# Patient Record
Sex: Female | Born: 1940 | Race: White | Hispanic: No | Smoking: Former smoker
Health system: Southern US, Community
[De-identification: ages and names within clinical notes are randomized; demographics above are authoritative.]

## PROBLEM LIST (undated history)

## (undated) DIAGNOSIS — K5520 Angiodysplasia of colon without hemorrhage: Secondary | ICD-10-CM

## (undated) DIAGNOSIS — D509 Iron deficiency anemia, unspecified: Secondary | ICD-10-CM

## (undated) DIAGNOSIS — R51 Headache: Secondary | ICD-10-CM

## (undated) DIAGNOSIS — K922 Gastrointestinal hemorrhage, unspecified: Secondary | ICD-10-CM

## (undated) DIAGNOSIS — I959 Hypotension, unspecified: Secondary | ICD-10-CM

## (undated) DIAGNOSIS — J8410 Pulmonary fibrosis, unspecified: Secondary | ICD-10-CM

## (undated) DIAGNOSIS — M810 Age-related osteoporosis without current pathological fracture: Secondary | ICD-10-CM

## (undated) DIAGNOSIS — G4730 Sleep apnea, unspecified: Secondary | ICD-10-CM

## (undated) DIAGNOSIS — Z8489 Family history of other specified conditions: Secondary | ICD-10-CM

## (undated) DIAGNOSIS — R202 Paresthesia of skin: Secondary | ICD-10-CM

## (undated) DIAGNOSIS — Z973 Presence of spectacles and contact lenses: Secondary | ICD-10-CM

## (undated) DIAGNOSIS — J45909 Unspecified asthma, uncomplicated: Secondary | ICD-10-CM

## (undated) DIAGNOSIS — R000 Tachycardia, unspecified: Secondary | ICD-10-CM

## (undated) DIAGNOSIS — K745 Biliary cirrhosis, unspecified: Secondary | ICD-10-CM

## (undated) DIAGNOSIS — Z923 Personal history of irradiation: Secondary | ICD-10-CM

## (undated) DIAGNOSIS — F419 Anxiety disorder, unspecified: Secondary | ICD-10-CM

## (undated) DIAGNOSIS — J189 Pneumonia, unspecified organism: Secondary | ICD-10-CM

## (undated) DIAGNOSIS — R519 Headache, unspecified: Secondary | ICD-10-CM

## (undated) DIAGNOSIS — K219 Gastro-esophageal reflux disease without esophagitis: Secondary | ICD-10-CM

## (undated) DIAGNOSIS — Z8669 Personal history of other diseases of the nervous system and sense organs: Secondary | ICD-10-CM

## (undated) DIAGNOSIS — K7460 Unspecified cirrhosis of liver: Secondary | ICD-10-CM

## (undated) DIAGNOSIS — E785 Hyperlipidemia, unspecified: Secondary | ICD-10-CM

## (undated) DIAGNOSIS — Z9981 Dependence on supplemental oxygen: Secondary | ICD-10-CM

## (undated) DIAGNOSIS — R001 Bradycardia, unspecified: Secondary | ICD-10-CM

## (undated) DIAGNOSIS — K8020 Calculus of gallbladder without cholecystitis without obstruction: Secondary | ICD-10-CM

## (undated) DIAGNOSIS — F329 Major depressive disorder, single episode, unspecified: Secondary | ICD-10-CM

## (undated) DIAGNOSIS — F32A Depression, unspecified: Secondary | ICD-10-CM

## (undated) DIAGNOSIS — I214 Non-ST elevation (NSTEMI) myocardial infarction: Secondary | ICD-10-CM

## (undated) DIAGNOSIS — K743 Primary biliary cirrhosis: Secondary | ICD-10-CM

## (undated) DIAGNOSIS — R0600 Dyspnea, unspecified: Secondary | ICD-10-CM

## (undated) DIAGNOSIS — D649 Anemia, unspecified: Secondary | ICD-10-CM

## (undated) DIAGNOSIS — Z972 Presence of dental prosthetic device (complete) (partial): Secondary | ICD-10-CM

## (undated) DIAGNOSIS — M797 Fibromyalgia: Secondary | ICD-10-CM

## (undated) HISTORY — PX: TONSILLECTOMY: SUR1361

## (undated) HISTORY — DX: Unspecified asthma, uncomplicated: J45.909

## (undated) HISTORY — PX: SHOULDER SURGERY: SHX246

## (undated) HISTORY — PX: WRIST FRACTURE SURGERY: SHX121

## (undated) HISTORY — DX: Gastrointestinal hemorrhage, unspecified: K92.2

## (undated) HISTORY — DX: Sleep apnea, unspecified: G47.30

## (undated) HISTORY — DX: Anxiety disorder, unspecified: F41.9

## (undated) HISTORY — DX: Personal history of irradiation: Z92.3

## (undated) HISTORY — DX: Biliary cirrhosis, unspecified: K74.5

## (undated) HISTORY — DX: Pulmonary fibrosis, unspecified: J84.10

## (undated) HISTORY — DX: Angiodysplasia of colon without hemorrhage: K55.20

## (undated) HISTORY — PX: LAMINECTOMY: SHX219

## (undated) HISTORY — DX: Fibromyalgia: M79.7

## (undated) HISTORY — DX: Iron deficiency anemia, unspecified: D50.9

## (undated) HISTORY — DX: Hyperlipidemia, unspecified: E78.5

## (undated) HISTORY — DX: Gastro-esophageal reflux disease without esophagitis: K21.9

## (undated) HISTORY — PX: TOE SURGERY: SHX1073

## (undated) HISTORY — DX: Non-ST elevation (NSTEMI) myocardial infarction: I21.4

## (undated) HISTORY — PX: CATARACT EXTRACTION, BILATERAL: SHX1313

## (undated) HISTORY — DX: Primary biliary cirrhosis: K74.3

## (undated) HISTORY — DX: Unspecified cirrhosis of liver: K74.60

## (undated) HISTORY — DX: Pneumonia, unspecified organism: J18.9

## (undated) HISTORY — DX: Calculus of gallbladder without cholecystitis without obstruction: K80.20

## (undated) HISTORY — DX: Anemia, unspecified: D64.9

## (undated) HISTORY — DX: Age-related osteoporosis without current pathological fracture: M81.0

## (undated) HISTORY — PX: CHOLECYSTECTOMY: SHX55

---
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
## 2002-08-17 DIAGNOSIS — I214 Non-ST elevation (NSTEMI) myocardial infarction: Secondary | ICD-10-CM | POA: Insufficient documentation

## 2002-08-17 HISTORY — DX: Non-ST elevation (NSTEMI) myocardial infarction: I21.4

## 2002-08-17 HISTORY — PX: CARDIAC CATHETERIZATION: SHX172

## 2008-11-08 HISTORY — PX: COLONOSCOPY: SHX174

## 2011-08-19 DIAGNOSIS — Z1231 Encounter for screening mammogram for malignant neoplasm of breast: Secondary | ICD-10-CM | POA: Diagnosis not present

## 2011-09-02 DIAGNOSIS — F339 Major depressive disorder, recurrent, unspecified: Secondary | ICD-10-CM | POA: Diagnosis not present

## 2011-09-10 DIAGNOSIS — N951 Menopausal and female climacteric states: Secondary | ICD-10-CM | POA: Diagnosis not present

## 2011-09-10 DIAGNOSIS — J069 Acute upper respiratory infection, unspecified: Secondary | ICD-10-CM | POA: Diagnosis not present

## 2011-09-10 DIAGNOSIS — E7800 Pure hypercholesterolemia, unspecified: Secondary | ICD-10-CM | POA: Diagnosis not present

## 2011-09-10 DIAGNOSIS — Z23 Encounter for immunization: Secondary | ICD-10-CM | POA: Diagnosis not present

## 2011-09-10 DIAGNOSIS — Z0000 Encounter for general adult medical examination without abnormal findings: Secondary | ICD-10-CM | POA: Diagnosis not present

## 2011-09-10 DIAGNOSIS — M949 Disorder of cartilage, unspecified: Secondary | ICD-10-CM | POA: Diagnosis not present

## 2011-09-14 DIAGNOSIS — M949 Disorder of cartilage, unspecified: Secondary | ICD-10-CM | POA: Diagnosis not present

## 2011-09-14 DIAGNOSIS — M899 Disorder of bone, unspecified: Secondary | ICD-10-CM | POA: Diagnosis not present

## 2011-09-17 DIAGNOSIS — E538 Deficiency of other specified B group vitamins: Secondary | ICD-10-CM | POA: Diagnosis not present

## 2011-10-19 DIAGNOSIS — E538 Deficiency of other specified B group vitamins: Secondary | ICD-10-CM | POA: Diagnosis not present

## 2011-10-29 DIAGNOSIS — Z23 Encounter for immunization: Secondary | ICD-10-CM | POA: Diagnosis not present

## 2011-11-25 DIAGNOSIS — F339 Major depressive disorder, recurrent, unspecified: Secondary | ICD-10-CM | POA: Diagnosis not present

## 2012-01-01 DIAGNOSIS — E538 Deficiency of other specified B group vitamins: Secondary | ICD-10-CM | POA: Diagnosis not present

## 2012-02-01 DIAGNOSIS — E538 Deficiency of other specified B group vitamins: Secondary | ICD-10-CM | POA: Diagnosis not present

## 2012-02-25 DIAGNOSIS — F339 Major depressive disorder, recurrent, unspecified: Secondary | ICD-10-CM | POA: Diagnosis not present

## 2012-03-02 DIAGNOSIS — E538 Deficiency of other specified B group vitamins: Secondary | ICD-10-CM | POA: Diagnosis not present

## 2012-03-07 DIAGNOSIS — R1031 Right lower quadrant pain: Secondary | ICD-10-CM | POA: Diagnosis not present

## 2012-03-16 DIAGNOSIS — R1031 Right lower quadrant pain: Secondary | ICD-10-CM | POA: Diagnosis not present

## 2012-03-21 DIAGNOSIS — R1031 Right lower quadrant pain: Secondary | ICD-10-CM | POA: Diagnosis not present

## 2012-03-21 DIAGNOSIS — D649 Anemia, unspecified: Secondary | ICD-10-CM | POA: Diagnosis not present

## 2012-03-21 DIAGNOSIS — K745 Biliary cirrhosis, unspecified: Secondary | ICD-10-CM | POA: Diagnosis not present

## 2012-03-21 DIAGNOSIS — E7800 Pure hypercholesterolemia, unspecified: Secondary | ICD-10-CM | POA: Diagnosis not present

## 2012-04-06 DIAGNOSIS — E538 Deficiency of other specified B group vitamins: Secondary | ICD-10-CM | POA: Diagnosis not present

## 2012-05-12 DIAGNOSIS — E538 Deficiency of other specified B group vitamins: Secondary | ICD-10-CM | POA: Diagnosis not present

## 2012-06-01 DIAGNOSIS — F339 Major depressive disorder, recurrent, unspecified: Secondary | ICD-10-CM | POA: Diagnosis not present

## 2012-06-13 DIAGNOSIS — S52209A Unspecified fracture of shaft of unspecified ulna, initial encounter for closed fracture: Secondary | ICD-10-CM | POA: Diagnosis not present

## 2012-06-13 DIAGNOSIS — S52309A Unspecified fracture of shaft of unspecified radius, initial encounter for closed fracture: Secondary | ICD-10-CM | POA: Diagnosis not present

## 2012-06-13 DIAGNOSIS — R072 Precordial pain: Secondary | ICD-10-CM | POA: Diagnosis not present

## 2012-06-13 DIAGNOSIS — E538 Deficiency of other specified B group vitamins: Secondary | ICD-10-CM | POA: Diagnosis not present

## 2012-06-13 DIAGNOSIS — R079 Chest pain, unspecified: Secondary | ICD-10-CM | POA: Diagnosis not present

## 2012-07-18 DIAGNOSIS — E538 Deficiency of other specified B group vitamins: Secondary | ICD-10-CM | POA: Diagnosis not present

## 2012-08-23 DIAGNOSIS — E538 Deficiency of other specified B group vitamins: Secondary | ICD-10-CM | POA: Diagnosis not present

## 2012-08-23 DIAGNOSIS — E7800 Pure hypercholesterolemia, unspecified: Secondary | ICD-10-CM | POA: Diagnosis not present

## 2012-08-30 DIAGNOSIS — F339 Major depressive disorder, recurrent, unspecified: Secondary | ICD-10-CM | POA: Diagnosis not present

## 2012-09-01 DIAGNOSIS — L578 Other skin changes due to chronic exposure to nonionizing radiation: Secondary | ICD-10-CM | POA: Diagnosis not present

## 2012-09-01 DIAGNOSIS — L821 Other seborrheic keratosis: Secondary | ICD-10-CM | POA: Diagnosis not present

## 2012-09-01 DIAGNOSIS — L819 Disorder of pigmentation, unspecified: Secondary | ICD-10-CM | POA: Diagnosis not present

## 2012-09-01 DIAGNOSIS — D485 Neoplasm of uncertain behavior of skin: Secondary | ICD-10-CM | POA: Diagnosis not present

## 2012-09-23 DIAGNOSIS — E538 Deficiency of other specified B group vitamins: Secondary | ICD-10-CM | POA: Diagnosis not present

## 2012-10-05 DIAGNOSIS — Z0000 Encounter for general adult medical examination without abnormal findings: Secondary | ICD-10-CM | POA: Diagnosis not present

## 2012-10-05 DIAGNOSIS — E7800 Pure hypercholesterolemia, unspecified: Secondary | ICD-10-CM | POA: Diagnosis not present

## 2012-10-05 DIAGNOSIS — E538 Deficiency of other specified B group vitamins: Secondary | ICD-10-CM | POA: Diagnosis not present

## 2012-10-05 DIAGNOSIS — N951 Menopausal and female climacteric states: Secondary | ICD-10-CM | POA: Diagnosis not present

## 2012-10-05 DIAGNOSIS — E559 Vitamin D deficiency, unspecified: Secondary | ICD-10-CM | POA: Diagnosis not present

## 2012-10-14 DIAGNOSIS — E538 Deficiency of other specified B group vitamins: Secondary | ICD-10-CM | POA: Diagnosis not present

## 2012-10-14 DIAGNOSIS — Z1231 Encounter for screening mammogram for malignant neoplasm of breast: Secondary | ICD-10-CM | POA: Diagnosis not present

## 2012-10-14 DIAGNOSIS — E7800 Pure hypercholesterolemia, unspecified: Secondary | ICD-10-CM | POA: Diagnosis not present

## 2012-10-14 DIAGNOSIS — E559 Vitamin D deficiency, unspecified: Secondary | ICD-10-CM | POA: Diagnosis not present

## 2012-10-27 DIAGNOSIS — E538 Deficiency of other specified B group vitamins: Secondary | ICD-10-CM | POA: Diagnosis not present

## 2012-11-14 DIAGNOSIS — H2510 Age-related nuclear cataract, unspecified eye: Secondary | ICD-10-CM | POA: Diagnosis not present

## 2012-11-14 DIAGNOSIS — H40059 Ocular hypertension, unspecified eye: Secondary | ICD-10-CM | POA: Diagnosis not present

## 2012-11-28 DIAGNOSIS — F339 Major depressive disorder, recurrent, unspecified: Secondary | ICD-10-CM | POA: Diagnosis not present

## 2012-11-28 DIAGNOSIS — E538 Deficiency of other specified B group vitamins: Secondary | ICD-10-CM | POA: Diagnosis not present

## 2012-12-28 DIAGNOSIS — E538 Deficiency of other specified B group vitamins: Secondary | ICD-10-CM | POA: Diagnosis not present

## 2013-01-20 DIAGNOSIS — H25049 Posterior subcapsular polar age-related cataract, unspecified eye: Secondary | ICD-10-CM | POA: Diagnosis not present

## 2013-01-20 DIAGNOSIS — H18419 Arcus senilis, unspecified eye: Secondary | ICD-10-CM | POA: Diagnosis not present

## 2013-01-20 DIAGNOSIS — H25019 Cortical age-related cataract, unspecified eye: Secondary | ICD-10-CM | POA: Diagnosis not present

## 2013-01-20 DIAGNOSIS — H2510 Age-related nuclear cataract, unspecified eye: Secondary | ICD-10-CM | POA: Diagnosis not present

## 2013-01-20 DIAGNOSIS — H02839 Dermatochalasis of unspecified eye, unspecified eyelid: Secondary | ICD-10-CM | POA: Diagnosis not present

## 2013-02-28 DIAGNOSIS — Z01810 Encounter for preprocedural cardiovascular examination: Secondary | ICD-10-CM | POA: Diagnosis not present

## 2013-03-01 DIAGNOSIS — E538 Deficiency of other specified B group vitamins: Secondary | ICD-10-CM | POA: Diagnosis not present

## 2013-03-01 DIAGNOSIS — F339 Major depressive disorder, recurrent, unspecified: Secondary | ICD-10-CM | POA: Diagnosis not present

## 2013-03-06 DIAGNOSIS — H2510 Age-related nuclear cataract, unspecified eye: Secondary | ICD-10-CM | POA: Diagnosis not present

## 2013-03-06 DIAGNOSIS — H269 Unspecified cataract: Secondary | ICD-10-CM | POA: Diagnosis not present

## 2013-03-07 DIAGNOSIS — H2510 Age-related nuclear cataract, unspecified eye: Secondary | ICD-10-CM | POA: Diagnosis not present

## 2013-03-20 DIAGNOSIS — H269 Unspecified cataract: Secondary | ICD-10-CM | POA: Diagnosis not present

## 2013-03-20 DIAGNOSIS — H2510 Age-related nuclear cataract, unspecified eye: Secondary | ICD-10-CM | POA: Diagnosis not present

## 2013-04-06 DIAGNOSIS — E538 Deficiency of other specified B group vitamins: Secondary | ICD-10-CM | POA: Diagnosis not present

## 2013-05-08 DIAGNOSIS — E538 Deficiency of other specified B group vitamins: Secondary | ICD-10-CM | POA: Diagnosis not present

## 2013-06-01 DIAGNOSIS — F339 Major depressive disorder, recurrent, unspecified: Secondary | ICD-10-CM | POA: Diagnosis not present

## 2013-06-07 DIAGNOSIS — Z23 Encounter for immunization: Secondary | ICD-10-CM | POA: Diagnosis not present

## 2013-06-07 DIAGNOSIS — N23 Unspecified renal colic: Secondary | ICD-10-CM | POA: Diagnosis not present

## 2013-06-07 DIAGNOSIS — R1011 Right upper quadrant pain: Secondary | ICD-10-CM | POA: Diagnosis not present

## 2013-06-07 DIAGNOSIS — E538 Deficiency of other specified B group vitamins: Secondary | ICD-10-CM | POA: Diagnosis not present

## 2013-06-16 DIAGNOSIS — N23 Unspecified renal colic: Secondary | ICD-10-CM | POA: Diagnosis not present

## 2013-06-16 DIAGNOSIS — K8020 Calculus of gallbladder without cholecystitis without obstruction: Secondary | ICD-10-CM | POA: Diagnosis not present

## 2013-06-16 DIAGNOSIS — N3289 Other specified disorders of bladder: Secondary | ICD-10-CM | POA: Diagnosis not present

## 2013-06-16 DIAGNOSIS — R1011 Right upper quadrant pain: Secondary | ICD-10-CM | POA: Diagnosis not present

## 2013-06-29 DIAGNOSIS — K8010 Calculus of gallbladder with chronic cholecystitis without obstruction: Secondary | ICD-10-CM | POA: Diagnosis not present

## 2013-06-29 DIAGNOSIS — R1011 Right upper quadrant pain: Secondary | ICD-10-CM | POA: Diagnosis not present

## 2013-06-29 DIAGNOSIS — K7460 Unspecified cirrhosis of liver: Secondary | ICD-10-CM | POA: Diagnosis not present

## 2013-06-29 DIAGNOSIS — R1013 Epigastric pain: Secondary | ICD-10-CM | POA: Diagnosis not present

## 2013-07-05 DIAGNOSIS — I428 Other cardiomyopathies: Secondary | ICD-10-CM | POA: Diagnosis not present

## 2013-07-05 DIAGNOSIS — E7800 Pure hypercholesterolemia, unspecified: Secondary | ICD-10-CM | POA: Diagnosis not present

## 2013-07-05 DIAGNOSIS — I2510 Atherosclerotic heart disease of native coronary artery without angina pectoris: Secondary | ICD-10-CM | POA: Diagnosis not present

## 2013-07-06 DIAGNOSIS — Z01810 Encounter for preprocedural cardiovascular examination: Secondary | ICD-10-CM | POA: Diagnosis not present

## 2013-07-06 DIAGNOSIS — I2510 Atherosclerotic heart disease of native coronary artery without angina pectoris: Secondary | ICD-10-CM | POA: Diagnosis not present

## 2013-07-07 DIAGNOSIS — I359 Nonrheumatic aortic valve disorder, unspecified: Secondary | ICD-10-CM | POA: Diagnosis not present

## 2013-07-07 DIAGNOSIS — I059 Rheumatic mitral valve disease, unspecified: Secondary | ICD-10-CM | POA: Diagnosis not present

## 2013-07-07 DIAGNOSIS — I079 Rheumatic tricuspid valve disease, unspecified: Secondary | ICD-10-CM | POA: Diagnosis not present

## 2013-07-07 DIAGNOSIS — I428 Other cardiomyopathies: Secondary | ICD-10-CM | POA: Diagnosis not present

## 2013-07-10 DIAGNOSIS — E538 Deficiency of other specified B group vitamins: Secondary | ICD-10-CM | POA: Diagnosis not present

## 2013-08-02 DIAGNOSIS — K8010 Calculus of gallbladder with chronic cholecystitis without obstruction: Secondary | ICD-10-CM | POA: Diagnosis not present

## 2013-08-02 DIAGNOSIS — D134 Benign neoplasm of liver: Secondary | ICD-10-CM | POA: Diagnosis not present

## 2013-08-02 DIAGNOSIS — K7200 Acute and subacute hepatic failure without coma: Secondary | ICD-10-CM | POA: Diagnosis not present

## 2013-08-02 DIAGNOSIS — K8020 Calculus of gallbladder without cholecystitis without obstruction: Secondary | ICD-10-CM | POA: Diagnosis not present

## 2013-08-02 DIAGNOSIS — I519 Heart disease, unspecified: Secondary | ICD-10-CM | POA: Diagnosis not present

## 2013-08-02 DIAGNOSIS — K769 Liver disease, unspecified: Secondary | ICD-10-CM | POA: Diagnosis not present

## 2013-08-02 DIAGNOSIS — K819 Cholecystitis, unspecified: Secondary | ICD-10-CM | POA: Diagnosis not present

## 2013-08-02 DIAGNOSIS — I252 Old myocardial infarction: Secondary | ICD-10-CM | POA: Diagnosis not present

## 2013-08-09 DIAGNOSIS — E538 Deficiency of other specified B group vitamins: Secondary | ICD-10-CM | POA: Diagnosis not present

## 2013-08-31 DIAGNOSIS — F339 Major depressive disorder, recurrent, unspecified: Secondary | ICD-10-CM | POA: Diagnosis not present

## 2013-09-04 DIAGNOSIS — J1189 Influenza due to unidentified influenza virus with other manifestations: Secondary | ICD-10-CM | POA: Diagnosis not present

## 2013-09-04 DIAGNOSIS — J189 Pneumonia, unspecified organism: Secondary | ICD-10-CM | POA: Diagnosis not present

## 2013-09-07 DIAGNOSIS — J069 Acute upper respiratory infection, unspecified: Secondary | ICD-10-CM | POA: Diagnosis not present

## 2013-09-07 DIAGNOSIS — J984 Other disorders of lung: Secondary | ICD-10-CM | POA: Diagnosis not present

## 2013-09-07 DIAGNOSIS — J189 Pneumonia, unspecified organism: Secondary | ICD-10-CM | POA: Diagnosis not present

## 2013-09-11 DIAGNOSIS — J189 Pneumonia, unspecified organism: Secondary | ICD-10-CM | POA: Diagnosis not present

## 2013-09-11 DIAGNOSIS — E538 Deficiency of other specified B group vitamins: Secondary | ICD-10-CM | POA: Diagnosis not present

## 2013-10-05 DIAGNOSIS — E538 Deficiency of other specified B group vitamins: Secondary | ICD-10-CM | POA: Diagnosis not present

## 2013-10-05 DIAGNOSIS — E7800 Pure hypercholesterolemia, unspecified: Secondary | ICD-10-CM | POA: Diagnosis not present

## 2013-10-05 DIAGNOSIS — Z79899 Other long term (current) drug therapy: Secondary | ICD-10-CM | POA: Diagnosis not present

## 2013-10-05 DIAGNOSIS — Z0000 Encounter for general adult medical examination without abnormal findings: Secondary | ICD-10-CM | POA: Diagnosis not present

## 2013-10-05 DIAGNOSIS — Z6830 Body mass index (BMI) 30.0-30.9, adult: Secondary | ICD-10-CM | POA: Diagnosis not present

## 2013-10-05 DIAGNOSIS — R1011 Right upper quadrant pain: Secondary | ICD-10-CM | POA: Diagnosis not present

## 2013-10-05 DIAGNOSIS — Z23 Encounter for immunization: Secondary | ICD-10-CM | POA: Diagnosis not present

## 2013-10-17 DIAGNOSIS — E7800 Pure hypercholesterolemia, unspecified: Secondary | ICD-10-CM | POA: Diagnosis not present

## 2013-10-17 DIAGNOSIS — E538 Deficiency of other specified B group vitamins: Secondary | ICD-10-CM | POA: Diagnosis not present

## 2013-10-17 DIAGNOSIS — Z1231 Encounter for screening mammogram for malignant neoplasm of breast: Secondary | ICD-10-CM | POA: Diagnosis not present

## 2013-10-27 DIAGNOSIS — E538 Deficiency of other specified B group vitamins: Secondary | ICD-10-CM | POA: Diagnosis not present

## 2013-11-23 DIAGNOSIS — J209 Acute bronchitis, unspecified: Secondary | ICD-10-CM | POA: Diagnosis not present

## 2013-11-24 DIAGNOSIS — R0989 Other specified symptoms and signs involving the circulatory and respiratory systems: Secondary | ICD-10-CM | POA: Diagnosis not present

## 2013-11-24 DIAGNOSIS — R0609 Other forms of dyspnea: Secondary | ICD-10-CM | POA: Diagnosis not present

## 2013-11-24 DIAGNOSIS — J984 Other disorders of lung: Secondary | ICD-10-CM | POA: Diagnosis not present

## 2013-11-24 DIAGNOSIS — J189 Pneumonia, unspecified organism: Secondary | ICD-10-CM | POA: Diagnosis not present

## 2013-11-29 DIAGNOSIS — F339 Major depressive disorder, recurrent, unspecified: Secondary | ICD-10-CM | POA: Diagnosis not present

## 2013-12-05 DIAGNOSIS — H40059 Ocular hypertension, unspecified eye: Secondary | ICD-10-CM | POA: Diagnosis not present

## 2013-12-13 DIAGNOSIS — J309 Allergic rhinitis, unspecified: Secondary | ICD-10-CM | POA: Diagnosis not present

## 2013-12-13 DIAGNOSIS — J189 Pneumonia, unspecified organism: Secondary | ICD-10-CM | POA: Diagnosis not present

## 2013-12-13 DIAGNOSIS — B351 Tinea unguium: Secondary | ICD-10-CM | POA: Diagnosis not present

## 2014-01-24 DIAGNOSIS — H1045 Other chronic allergic conjunctivitis: Secondary | ICD-10-CM | POA: Diagnosis not present

## 2014-02-28 DIAGNOSIS — F339 Major depressive disorder, recurrent, unspecified: Secondary | ICD-10-CM | POA: Diagnosis not present

## 2014-05-31 DIAGNOSIS — J189 Pneumonia, unspecified organism: Secondary | ICD-10-CM | POA: Diagnosis not present

## 2014-05-31 DIAGNOSIS — F3342 Major depressive disorder, recurrent, in full remission: Secondary | ICD-10-CM | POA: Diagnosis not present

## 2014-06-20 DIAGNOSIS — J189 Pneumonia, unspecified organism: Secondary | ICD-10-CM | POA: Diagnosis not present

## 2014-06-20 DIAGNOSIS — J984 Other disorders of lung: Secondary | ICD-10-CM | POA: Diagnosis not present

## 2014-07-10 DIAGNOSIS — R918 Other nonspecific abnormal finding of lung field: Secondary | ICD-10-CM | POA: Diagnosis not present

## 2014-07-10 DIAGNOSIS — R0989 Other specified symptoms and signs involving the circulatory and respiratory systems: Secondary | ICD-10-CM | POA: Diagnosis not present

## 2014-07-10 DIAGNOSIS — J189 Pneumonia, unspecified organism: Secondary | ICD-10-CM | POA: Diagnosis not present

## 2014-07-10 DIAGNOSIS — J188 Other pneumonia, unspecified organism: Secondary | ICD-10-CM | POA: Diagnosis not present

## 2014-07-10 DIAGNOSIS — R05 Cough: Secondary | ICD-10-CM | POA: Diagnosis not present

## 2014-07-11 DIAGNOSIS — J069 Acute upper respiratory infection, unspecified: Secondary | ICD-10-CM | POA: Diagnosis not present

## 2014-07-31 DIAGNOSIS — J309 Allergic rhinitis, unspecified: Secondary | ICD-10-CM | POA: Diagnosis not present

## 2014-07-31 DIAGNOSIS — J188 Other pneumonia, unspecified organism: Secondary | ICD-10-CM | POA: Diagnosis not present

## 2014-08-02 DIAGNOSIS — J188 Other pneumonia, unspecified organism: Secondary | ICD-10-CM | POA: Diagnosis not present

## 2014-08-02 DIAGNOSIS — J189 Pneumonia, unspecified organism: Secondary | ICD-10-CM | POA: Diagnosis not present

## 2014-08-02 DIAGNOSIS — R599 Enlarged lymph nodes, unspecified: Secondary | ICD-10-CM | POA: Diagnosis not present

## 2014-08-06 ENCOUNTER — Telehealth: Payer: Self-pay | Admitting: Internal Medicine

## 2014-08-06 ENCOUNTER — Other Ambulatory Visit (INDEPENDENT_AMBULATORY_CARE_PROVIDER_SITE_OTHER): Payer: Medicare Other

## 2014-08-06 ENCOUNTER — Ambulatory Visit (INDEPENDENT_AMBULATORY_CARE_PROVIDER_SITE_OTHER): Payer: Medicare Other | Admitting: Internal Medicine

## 2014-08-06 ENCOUNTER — Encounter: Payer: Self-pay | Admitting: Internal Medicine

## 2014-08-06 VITALS — BP 128/84 | HR 78 | Ht 63.0 in | Wt 173.4 lb

## 2014-08-06 DIAGNOSIS — R05 Cough: Secondary | ICD-10-CM | POA: Diagnosis not present

## 2014-08-06 DIAGNOSIS — Z23 Encounter for immunization: Secondary | ICD-10-CM | POA: Diagnosis not present

## 2014-08-06 DIAGNOSIS — R059 Cough, unspecified: Secondary | ICD-10-CM

## 2014-08-06 DIAGNOSIS — J189 Pneumonia, unspecified organism: Secondary | ICD-10-CM | POA: Diagnosis not present

## 2014-08-06 DIAGNOSIS — J8410 Pulmonary fibrosis, unspecified: Secondary | ICD-10-CM

## 2014-08-06 LAB — CBC WITH DIFFERENTIAL/PLATELET
BASOS ABS: 0 10*3/uL (ref 0.0–0.1)
Basophils Relative: 0.5 % (ref 0.0–3.0)
Eosinophils Absolute: 0.2 10*3/uL (ref 0.0–0.7)
Eosinophils Relative: 2.1 % (ref 0.0–5.0)
HEMATOCRIT: 37.7 % — AB (ref 39.0–52.0)
Hemoglobin: 12 g/dL — ABNORMAL LOW (ref 13.0–17.0)
LYMPHS ABS: 1.7 10*3/uL (ref 0.7–4.0)
Lymphocytes Relative: 18.3 % (ref 12.0–46.0)
MCHC: 31.9 g/dL (ref 30.0–36.0)
MCV: 73.2 fl — ABNORMAL LOW (ref 78.0–100.0)
MONO ABS: 0.7 10*3/uL (ref 0.1–1.0)
Monocytes Relative: 8 % (ref 3.0–12.0)
Neutro Abs: 6.5 10*3/uL (ref 1.4–7.7)
Neutrophils Relative %: 71.1 % (ref 43.0–77.0)
PLATELETS: 444 10*3/uL — AB (ref 150.0–400.0)
RBC: 5.15 Mil/uL (ref 4.22–5.81)
RDW: 17.9 % — ABNORMAL HIGH (ref 11.5–15.5)
WBC: 9.1 10*3/uL (ref 4.0–10.5)

## 2014-08-06 NOTE — Telephone Encounter (Signed)
Err

## 2014-08-06 NOTE — Telephone Encounter (Signed)
Called and spoke with pt and she stated that she was to call back and let MW know that she is taking levaquin 500 mg daily.  She is aware to call back after she is done if her sputum is not clear.  Pt voiced her understanding and nothing further is needed. Will forward to MW for FYI.

## 2014-08-06 NOTE — Progress Notes (Signed)
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    08/06/2014 1st [facility] office visit/ Bastien Strawser  Already on tapering pred/ levaquin but not sure when started, did not bring meds in with her Chief Complaint  Patient presents with  . Pulmonary Consult    Referred by Dr. Burnett Sheng for eval of recurrent PNA. Pt states that she had PNA 4 x in 2015. She c/o prod cough with large amounts of green sputum. She also c/o SOB with "walking around, not too far, it's not severe".   says the main problem that doesn't seem to be responding is poor sleep and fatigue.  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  Main resp concern is constant sense of pnds but says it doesn't keep he up or result in any am exac of coughing.  No obvious day to day or daytime variabilty or assoc   cp or chest tightness, subjective wheeze or overt  hb symptoms. No unusual exp hx or h/o childhood pna/ asthma or knowledge of premature birth.  Sleeping ok without nocturnal  or early am exacerbation  of respiratory  c/o's or need for noct saba. Also denies any obvious fluctuation of symptoms with weather or environmental changes or other aggravating or alleviating factors except as outlined above   Current Medications, Allergies, Complete Past Medical History, Past Surgical History, Family History, and Social History were reviewed in [employer] record.  ROS  The following are not active complaints  unless bolded sore throat, dysphagia, dental problems, itching, sneezing,  nasal congestion or excess/ purulent secretions, ear ache,   fever, chills, sweats, unintended wt loss, pleuritic or exertional cp, hemoptysis,  orthopnea pnd or leg swelling, presyncope, palpitations, heartburn, abdominal pain, anorexia, nausea, vomiting, diarrhea  or change in bowel or urinary habits, change in stools or urine, dysuria,hematuria,  rash, arthralgias, visual complaints, headache, numbness weakness or ataxia or problems with walking or coordination,  change in mood/affect or memory.       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 Nl external ear canals without cough reflex   NECK :  without JVD/Nodes/TM/ nl carotid upstrokes bilaterally   LUNGS: no acc muscle use,  Min bilateraly nsp crackles without cough on insp or exp maneuvers   CV:  RRR  no s3 or murmur or increase in P2, no edema   ABD:  soft and nontender with nl excursion in the supine position. No bruits or organomegaly, bowel sounds nl  MS:  warm without deformities, calf tenderness, cyanosis or clubbing  SKIN: warm and dry without lesions    NEURO:  alert, approp, no deficits    CT with contrast  08/01/14  Non specific adenopathy no nodes > 2 cm,  Thickening of espophagus, bilateral lower lobe honeycombing no change since 02/2012 , opacificatin of cyctic air spaces w/in L Lung base      Assessment:

## 2014-08-06 NOTE — Patient Instructions (Addendum)
Finish up all your antibiotics and call us with the name of it and all other medications that are not on your med sheet today (ask for [name] at [phone])  GERD (REFLUX)  is an extremely common cause of respiratory symptoms just like yours , many times with no obvious heartburn at all.    It can be treated with medication, but also with lifestyle changes including avoidance of late meals, excessive alcohol, smoking cessation, and avoid fatty foods, chocolate, peppermint, colas, red wine, and acidic juices such as orange juice.  NO MINT OR MENTHOL PRODUCTS SO NO COUGH DROPS  USE SUGARLESS CANDY INSTEAD (Jolley ranchers or Stover's or Life Savers) or even ice chips will also do - the key is to swallow to prevent all throat clearing. NO OIL BASED VITAMINS - use powdered substitutes.    Please see patient coordinator before you leave today  to schedule sinus CT  Please remember to go to the lab   department downstairs for your tests - we will call you with the results when they are available.  Call at the end of antibiotics if mucus is not clear - you need Prevnar13 if have not already received - one year after the other pneumonia shot   Please schedule a follow up office visit in 4 weeks, sooner if needed with all meds in hand

## 2014-08-07 DIAGNOSIS — J8410 Pulmonary fibrosis, unspecified: Secondary | ICD-10-CM | POA: Insufficient documentation

## 2014-08-07 DIAGNOSIS — J189 Pneumonia, unspecified organism: Secondary | ICD-10-CM | POA: Insufficient documentation

## 2014-08-07 LAB — ALLERGY FULL PROFILE
Alternaria Alternata: <0.1 kU/L
Aspergillus fumigatus, m3: <0.1 kU/L
Bermuda Grass: <0.1 kU/L
Candida Albicans: <0.1 kU/L
Cat Dander: <0.1 kU/L
Common Ragweed: <0.1 kU/L
Curvularia lunata: <0.1 kU/L
D. farinae: <0.1 kU/L
Dog Dander: <0.1 kU/L
Elm IgE: <0.1 kU/L
Fescue: <0.1 kU/L
G009 Red Top: <0.1 kU/L
Goldenrod: <0.1 kU/L
Helminthosporium halodes: <0.1 kU/L
House Dust Hollister: <0.1 kU/L
Lamb's Quarters: <0.1 kU/L
Plantain: <0.1 kU/L
Sycamore Tree: <0.1 kU/L

## 2014-08-07 NOTE — Assessment & Plan Note (Signed)
DDx for pulmonary fibrosis  includes idiopathic pulmonary fibrosis, pulmonary fibrosis associated with rheumatologic diseases (which have a relatively benign course in most cases) , adverse effect from  drugs such as chemotherapy or amiodarone exposure, nonspecific interstitial pneumonia which is typically steroid responsive, and chronic hypersensitivity pneumonitis.   In active  smokers Langerhan's Cell  Histiocyctosis (eosinophilic granuomatosis),  DIP,  and Respiratory Bronchiolitis ILD also need to be considered,   Use of PPI is associated with improved survival time and with decreased radiologic fibrosis per King's study published in AJRCCM vol 184 p1390.  Dec 2011  This may not be cause and effect, but given how universally unhelpful all the otherstudy drugs have been for pf,   rec max  ppi / diet/ lifestyle modification.    See instructions for specific recommendations which were reviewed directly with the patient who was given a copy with highlighter outlining the key components.

## 2014-08-07 NOTE — Assessment & Plan Note (Signed)
Already on levaquin which is approp and asked her to complete this then call me if mucus stays purulent to regroup before more empirical abx given risk of side effects and chance of developing resistant orgs/ c diff etc.

## 2014-08-07 NOTE — Assessment & Plan Note (Signed)
The most common causes of chronic cough in immunocompetent adults include the following: upper airway cough syndrome (UACS), previously referred to as postnasal drip syndrome (PNDS), which is caused by variety of rhinosinus conditions; (2) asthma; (3) GERD; (4) chronic bronchitis from cigarette smoking or other inhaled environmental irritants; (5) nonasthmatic eosinophilic bronchitis; and (6) bronchiectasis.   These conditions, singly or in combination, have accounted for up to 94% of the causes of chronic cough in prospective studies.   Other conditions have constituted no >6% of the causes in prospective studies These have included bronchogenic carcinoma, chronic interstitial pneumonia, sarcoidosis, left ventricular failure, ACEI-induced cough, and aspiration from a condition associated with pharyngeal dysfunction.    Chronic cough is often simultaneously caused by more than one condition. A single cause has been found from 38 to 82% of the time, multiple causes from 18 to 62%. Multiply caused cough has been the result of three diseases up to 42% of the time.       Most likely the chronic pnds assoc cough =  Classic Upper airway cough syndrome, so named because it's frequently impossible to sort out how much is  CR/sinusitis with freq throat clearing (which can be related to primary GERD)   vs  causing  secondary (" extra esophageal")  GERD from wide swings in gastric pressure that occur with throat clearing, often  promoting self use of mint and menthol lozenges that reduce the lower esophageal sphincter tone and exacerbate the problem further in a cyclical fashion.   These are the same pts (now being labeled as having "irritable larynx syndrome" by some cough centers) who not infrequently have a history of having failed to tolerate ace inhibitors,  dry powder inhalers or biphosphonates or report having atypical reflux symptoms that don't respond to standard doses of PPI , and are easily confused as  having aecopd or asthma flares by even experienced allergists/ pulmonologists.   For now eval with allergy profile and sinus CT and max rx for gerd then regroup in 4 weeks

## 2014-08-07 NOTE — Addendum Note (Signed)
Addended by: Inge Rise on: 08/07/2014 01:47 PM   Modules accepted: Medications

## 2014-08-13 ENCOUNTER — Telehealth: Payer: Self-pay | Admitting: Internal Medicine

## 2014-08-13 NOTE — Telephone Encounter (Signed)
White count was normal so I recommend she stay off the antibiotic and on the recs I gave her for at least a week then return to office to regroup with all meds in hand

## 2014-08-13 NOTE — Telephone Encounter (Signed)
Called and spoke with pt and she stated that she finished the levaquin and pred taper on Saturday.  She is still coughing up yellow sputum that is not as thick and does have some white sputum mixed with it.  She stated that she is still coughing but is not having any fever but still does not feel well.  She was to call back and let MW know how she was.  MW please advise. Thanks  Allergies  Allergen Reactions  . Paxil [Paroxetine Hcl] Itching  . Tamiflu [Oseltamivir Phosphate]     "I just get sick"    Current Outpatient Prescriptions on File Prior to Visit  Medication Sig Dispense Refill  . amitriptyline (ELAVIL) 10 MG tablet Take 20 mg by mouth at bedtime.    Marland Kitchen aspirin 81 MG tablet Take 81 mg by mouth daily.    . Cholecalciferol (VITAMIN D) 1000 UNITS capsule Take 1,000 Units by mouth daily.    Marland Kitchen ipratropium-albuterol (DUONEB) 0.5-2.5 (3) MG/3ML SOLN Take 3 mLs by nebulization every 6 (six) hours as needed.    . methylPREDNISolone (MEDROL DOSEPAK) 4 MG tablet follow package directions    . montelukast (SINGULAIR) 10 MG tablet Take 10 mg by mouth at bedtime.    . Multiple Vitamin (MULTIVITAMIN) capsule Take 1 capsule by mouth daily.    . sertraline (ZOLOFT) 100 MG tablet Take 150 mg by mouth daily.    Marland Kitchen zolpidem (AMBIEN) 5 MG tablet Take 5 mg by mouth at bedtime as needed for sleep.     No current facility-administered medications on file prior to visit.

## 2014-08-13 NOTE — Telephone Encounter (Signed)
Spoke with pt and notified of recs per Dr. Melvyn Novas. Pt verbalized understanding and denied any questions.

## 2014-08-15 ENCOUNTER — Ambulatory Visit (INDEPENDENT_AMBULATORY_CARE_PROVIDER_SITE_OTHER)
Admission: RE | Admit: 2014-08-15 | Discharge: 2014-08-15 | Disposition: A | Discharge status: Home / Self Care | Payer: Medicare Other | Source: Ambulatory Visit | Attending: Internal Medicine | Admitting: Internal Medicine

## 2014-08-15 DIAGNOSIS — R059 Cough, unspecified: Secondary | ICD-10-CM

## 2014-08-15 DIAGNOSIS — R05 Cough: Secondary | ICD-10-CM | POA: Diagnosis not present

## 2014-08-15 NOTE — Progress Notes (Signed)
Quick Note:  Spoke with pt and notified of results per Dr. Melvyn Novas. Pt verbalized understanding and denied any questions.m  ______

## 2014-08-27 ENCOUNTER — Telehealth: Payer: Self-pay | Admitting: Internal Medicine

## 2014-08-27 MED ORDER — AMOXICILLIN-POT CLAVULANATE 875-125 MG PO TABS: 1.0000 | ORAL_TABLET | Freq: Two times a day (BID) | ORAL | Status: DC

## 2014-08-27 NOTE — Telephone Encounter (Signed)
Ok to try Augmentin 875 mg take one pill twice daily  X 10 days - take at breakfast and supper with large glass of water.  It would help reduce the usual side effects (diarrhea and yeast infections) if you ate cultured yogurt at lunch.   Pending f/u ov - bring all meds

## 2014-08-27 NOTE — Telephone Encounter (Signed)
Spoke with pt - states that she is still having cough with thick yellow/green mucus and chills. Denies fever, SOB, chest tightness and wheezing. Upcoming appt 09/03/14 with MW.  Dr Melvyn Novas please advise. Thanks.

## 2014-08-27 NOTE — Telephone Encounter (Signed)
Called and spoke to pt. Informed pt of the recs per MW. Rx sent to preferred pharmacy. Pt verbalized understanding and denied any further questions or concerns at this time.   

## 2014-09-03 ENCOUNTER — Ambulatory Visit (INDEPENDENT_AMBULATORY_CARE_PROVIDER_SITE_OTHER): Payer: Medicare Other | Admitting: Internal Medicine

## 2014-09-03 ENCOUNTER — Encounter: Payer: Self-pay | Admitting: Internal Medicine

## 2014-09-03 VITALS — BP 126/74 | HR 77 | Temp 98.2°F | Ht 63.0 in | Wt 171.0 lb

## 2014-09-03 DIAGNOSIS — J8410 Pulmonary fibrosis, unspecified: Secondary | ICD-10-CM

## 2014-09-03 DIAGNOSIS — R05 Cough: Secondary | ICD-10-CM

## 2014-09-03 DIAGNOSIS — R059 Cough, unspecified: Secondary | ICD-10-CM

## 2014-09-03 MED ORDER — FAMOTIDINE 20 MG PO TABS: ORAL_TABLET | ORAL | Status: DC

## 2014-09-03 MED ORDER — PANTOPRAZOLE SODIUM 40 MG PO TBEC: 40.0000 mg | DELAYED_RELEASE_TABLET | Freq: Every day | ORAL | Status: DC

## 2014-09-03 NOTE — Patient Instructions (Signed)
Pantoprazole (protonix) 40 mg   Take 30-60 min before first meal of the day and Pepcid 20 mg one bedtime until return to office - this is the best way to tell whether stomach acid is contributing to your problem.   GERD (REFLUX)  is an extremely common cause of respiratory symptoms just like yours , many times with no obvious heartburn at all.    It can be treated with medication, but also with lifestyle changes including avoidance of late meals, excessive alcohol, smoking cessation, and avoid fatty foods, chocolate, peppermint, colas, red wine, and acidic juices such as orange juice.  NO MINT OR MENTHOL PRODUCTS SO NO COUGH DROPS  USE SUGARLESS CANDY INSTEAD (Jolley ranchers or Stover's or Life Savers) or even ice chips will also do - the key is to swallow to prevent all throat clearing. NO OIL BASED VITAMINS - use powdered substitutes.    Please schedule a follow up visit in 3 months but call sooner if needed for pfts and cxr

## 2014-09-03 NOTE — Progress Notes (Signed)
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   History of Present Illness  08/06/2014 1st [facility] office visit/ Lyle Niblett  Already on tapering pred/ levaquin but not sure when started, did not bring meds in with her Chief Complaint  Patient presents with  . Pulmonary Consult    Referred by Dr. Burnett Sheng for eval of recurrent PNA. Pt states that she had PNA 4 x in 2015. She c/o prod cough with large amounts of green sputum. She also c/o SOB with "walking around, not too far, it's not severe".   says the main problem that doesn't seem to be responding is poor sleep and fatigue.  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  Main resp concern is constant sense of pnds but says it doesn't keep he up or result in any am exac of coughing. rec Finish up all your antibiotics and call us with the name of it and all other medications that are not on your med sheet today (ask for [name] at [phone]) GERD diet   schedule sinus CT> neg Please remember to go to the lab department downstairs for your tests - we will call you with the results when they are available. Call at the end of antibiotics if mucus is not clear - you need Prevnar13 if have not already received - one year after the other pneumonia shot       09/03/2014 f/u ov/Malasia Torain re: honeycombing/ ? Asp  Chief Complaint  Patient presents with  . Follow-up    pt states she has had improvement with her breathing  since last visit.  c/o sob with exertion.    Not limited by breathing from desired activities      No obvious day to day or daytime variabilty or assoc cough /congesiton/   cp or chest tightness, subjective wheeze or overt  hb symptoms. No unusual exp hx or h/o childhood pna/ asthma or knowledge of premature birth.  Sleeping ok without nocturnal  or early am exacerbation  of respiratory  c/o's or need for noct saba. Also denies any obvious fluctuation of symptoms with weather or environmental changes or other aggravating or alleviating factors except as outlined above   Current Medications, Allergies, Complete Past Medical History, Past Surgical History, Family History, and Social History were reviewed in [employer] record.  ROS  The following are not active complaints unless bolded sore throat, dysphagia, dental problems, itching, sneezing,  nasal congestion or excess/ purulent secretions, ear ache,   fever, chills, sweats, unintended wt loss, pleuritic or exertional cp, hemoptysis,  orthopnea pnd or leg swelling, presyncope, palpitations, heartburn, abdominal pain, anorexia, nausea, vomiting, diarrhea  or change in bowel or urinary habits, change in stools or urine, dysuria,hematuria,  rash, arthralgias, visual complaints, headache, numbness weakness or ataxia or problems with walking or coordination,  change in mood/affect or memory.  Objective:   Physical Exam amb elederly wf nad      Wt Readings from Last 3 Encounters:  09/03/14 171 lb (77.565 kg)  08/06/14 173 lb 6.4 oz (78.654 kg)    Vital signs reviewed    HEENT: nl dentition, turbinates, and orophanx. Nl external ear canals without cough reflex   NECK :  without JVD/Nodes/TM/ nl carotid upstrokes bilaterally   LUNGS: no acc muscle use,  Min bilateraly insp crackles without cough on insp or exp maneuvers   CV:  RRR  no s3 or murmur or increase in P2, no edema   ABD:  soft and nontender  with nl excursion in the supine position. No bruits or organomegaly, bowel sounds nl  MS:  warm without deformities, calf tenderness, cyanosis or clubbing  SKIN: warm and dry without lesions    NEURO:  alert, approp, no deficits    CT with contrast  08/01/14  Non specific adenopathy no nodes > 2 cm,  Thickening of espophagus, bilateral lower lobe honeycombing no change since 02/2012 , opacificatin of cystic air spaces w/in L Lung base      Assessment:

## 2014-09-05 ENCOUNTER — Encounter: Payer: Self-pay | Admitting: Internal Medicine

## 2014-09-05 NOTE — Assessment & Plan Note (Signed)
-   first dx 02/2012 with honeycombing in bases by CT   Symptomatically approved with nothing more than rx for gerd may suggest cause and effect so very important that she return for "more points on the curve" to see if any rx needed.

## 2014-09-05 NOTE — Assessment & Plan Note (Addendum)
-   Allergy profile 08/06/2014 >  Eos 0    Ig E <2, neg RAST - Sinus Ct  08/15/2014 Well aerated paranasal sinuses without sinusitis.  Cough that does not occur on insp has improved and is probably not related to PF but more likely  Classic Upper airway cough syndrome, so named because it's frequently impossible to sort out how much is  CR/sinusitis with freq throat clearing (which can be related to primary GERD)   vs  causing  secondary (" extra esophageal")  GERD from wide swings in gastric pressure that occur with throat clearing, often  promoting self use of mint and menthol lozenges that reduce the lower esophageal sphincter tone and exacerbate the problem further in a cyclical fashion.   These are the same pts (now being labeled as having "irritable larynx syndrome" by some cough centers) who not infrequently have a history of having failed to tolerate ace inhibitors,  dry powder inhalers or biphosphonates or report having atypical reflux symptoms that don't respond to standard doses of PPI , and are easily confused as having aecopd or asthma flares by even experienced allergists/ pulmonologists.   For now will rec she continue singulair for rhinitis  and max rx for gerd  > f/u prn

## 2014-10-08 DIAGNOSIS — M8589 Other specified disorders of bone density and structure, multiple sites: Secondary | ICD-10-CM | POA: Diagnosis not present

## 2014-10-08 DIAGNOSIS — G2581 Restless legs syndrome: Secondary | ICD-10-CM | POA: Diagnosis not present

## 2014-10-08 DIAGNOSIS — K219 Gastro-esophageal reflux disease without esophagitis: Secondary | ICD-10-CM | POA: Diagnosis not present

## 2014-10-08 DIAGNOSIS — Z0000 Encounter for general adult medical examination without abnormal findings: Secondary | ICD-10-CM | POA: Diagnosis not present

## 2014-10-08 DIAGNOSIS — D649 Anemia, unspecified: Secondary | ICD-10-CM | POA: Diagnosis not present

## 2014-10-08 DIAGNOSIS — F320 Major depressive disorder, single episode, mild: Secondary | ICD-10-CM | POA: Diagnosis not present

## 2014-10-08 DIAGNOSIS — K743 Primary biliary cirrhosis: Secondary | ICD-10-CM | POA: Diagnosis not present

## 2014-10-08 DIAGNOSIS — J302 Other seasonal allergic rhinitis: Secondary | ICD-10-CM | POA: Diagnosis not present

## 2014-10-08 DIAGNOSIS — Z79899 Other long term (current) drug therapy: Secondary | ICD-10-CM | POA: Diagnosis not present

## 2014-10-08 DIAGNOSIS — E780 Pure hypercholesterolemia: Secondary | ICD-10-CM | POA: Diagnosis not present

## 2014-10-08 DIAGNOSIS — E559 Vitamin D deficiency, unspecified: Secondary | ICD-10-CM | POA: Diagnosis not present

## 2014-10-10 DIAGNOSIS — E538 Deficiency of other specified B group vitamins: Secondary | ICD-10-CM | POA: Diagnosis not present

## 2014-10-11 ENCOUNTER — Encounter: Payer: Self-pay | Admitting: Gastroenterology

## 2014-10-19 DIAGNOSIS — Z1231 Encounter for screening mammogram for malignant neoplasm of breast: Secondary | ICD-10-CM | POA: Diagnosis not present

## 2014-10-19 DIAGNOSIS — M810 Age-related osteoporosis without current pathological fracture: Secondary | ICD-10-CM | POA: Diagnosis not present

## 2014-10-19 DIAGNOSIS — Z13820 Encounter for screening for osteoporosis: Secondary | ICD-10-CM | POA: Diagnosis not present

## 2014-11-01 DIAGNOSIS — R609 Edema, unspecified: Secondary | ICD-10-CM | POA: Diagnosis not present

## 2014-11-01 DIAGNOSIS — F5109 Other insomnia not due to a substance or known physiological condition: Secondary | ICD-10-CM | POA: Diagnosis not present

## 2014-11-02 DIAGNOSIS — H40003 Preglaucoma, unspecified, bilateral: Secondary | ICD-10-CM | POA: Diagnosis not present

## 2014-11-08 DIAGNOSIS — Z23 Encounter for immunization: Secondary | ICD-10-CM | POA: Diagnosis not present

## 2014-11-08 DIAGNOSIS — E538 Deficiency of other specified B group vitamins: Secondary | ICD-10-CM | POA: Diagnosis not present

## 2014-11-28 ENCOUNTER — Encounter: Payer: Self-pay | Admitting: Gastroenterology

## 2014-11-28 ENCOUNTER — Ambulatory Visit (INDEPENDENT_AMBULATORY_CARE_PROVIDER_SITE_OTHER): Payer: Medicare Other | Admitting: Gastroenterology

## 2014-11-28 ENCOUNTER — Other Ambulatory Visit (INDEPENDENT_AMBULATORY_CARE_PROVIDER_SITE_OTHER): Payer: Medicare Other

## 2014-11-28 VITALS — BP 126/70 | HR 72 | Ht 62.75 in | Wt 178.5 lb

## 2014-11-28 DIAGNOSIS — R938 Abnormal findings on diagnostic imaging of other specified body structures: Secondary | ICD-10-CM

## 2014-11-28 DIAGNOSIS — R9389 Abnormal findings on diagnostic imaging of other specified body structures: Secondary | ICD-10-CM

## 2014-11-28 DIAGNOSIS — K743 Primary biliary cirrhosis: Secondary | ICD-10-CM | POA: Insufficient documentation

## 2014-11-28 DIAGNOSIS — R109 Unspecified abdominal pain: Secondary | ICD-10-CM

## 2014-11-28 DIAGNOSIS — D509 Iron deficiency anemia, unspecified: Secondary | ICD-10-CM

## 2014-11-28 DIAGNOSIS — J8410 Pulmonary fibrosis, unspecified: Secondary | ICD-10-CM

## 2014-11-28 HISTORY — DX: Iron deficiency anemia, unspecified: D50.9

## 2014-11-28 HISTORY — DX: Primary biliary cirrhosis: K74.3

## 2014-11-28 LAB — HEPATIC FUNCTION PANEL
ALT: 30 U/L (ref 0–35)
AST: 43 U/L — ABNORMAL HIGH (ref 0–37)
Albumin: 3.8 g/dL (ref 3.5–5.2)
Alkaline Phosphatase: 318 U/L — ABNORMAL HIGH (ref 39–117)
Bilirubin, Direct: 0.1 mg/dL (ref 0.0–0.3)
Total Bilirubin: 0.3 mg/dL (ref 0.2–1.2)
Total Protein: 8.4 g/dL — ABNORMAL HIGH (ref 6.0–8.3)

## 2014-11-28 NOTE — Assessment & Plan Note (Signed)
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  Recommendations #1.  Repeat LFTs

## 2014-11-28 NOTE — Assessment & Plan Note (Signed)
CT of the chest in December, 2015 demonstrated mediastinal and left hilar adenopathy along with diffuse thickening of the lower esophagus.  Recently esophagus probably reflect inflammatory changes though neoplasm cannot be ruled out.  Although I think is less likely, I recommend upper endoscopy in view of the CT findings including regional lymphadenopathy to be sure there is not a neoplasm.

## 2014-11-28 NOTE — Assessment & Plan Note (Signed)
Iron deficiency anemia of unclear etiology.  Prior GI workup negative.  In view of presumed stability of hemoglobin will not pursue further at this time.

## 2014-11-28 NOTE — Patient Instructions (Addendum)
You have been scheduled for an endoscopy. Please follow written instructions given to you at your visit today. If you use inhalers (even only as needed), please bring them with you on the day of your procedure. Your physician has requested that you go to www.[email or web address] and enter the access code given to you at your visit today. This web site gives a general overview about your procedure. However, you should still follow specific instructions given to you by our office regarding your preparation for the procedure.  Go to the basement for labs

## 2014-11-28 NOTE — Progress Notes (Signed)
_                                                                                                                History of Present Illness:  Ms. Weightman is a 74 year old white female referred at the request of Dr. Burnett Sheng she has a history of iron deficiency anemia and PBC.  She was seen at [facility] in the past for [place] and was placed on Urso.  She no longer is taking this.  For workup of chronic cough she underwent a CT scan in December, 2015.  By report, it demonstrated mediastinal and left hilar adenopathy and diffuse wall thickening involving the distal esophagus.  She had been complaining of postprandial lower chest and upper epigastric pain with radiation to the back.  Since starting Protonix and Pepcid both cough and pain have improved.  She still has pain but it is less severe and less frequent.   Past Medical History  Diagnosis Date  . Pneumonia   . Hyperlipemia   . Biliary cirrhosis   . Anemia   . AVM (arteriovenous malformation) of small bowel, acquired   . Anxiety   . Gallstones   . GERD (gastroesophageal reflux disease)   . GI bleeding    Past Surgical History  Procedure Laterality Date  . Tonsillectomy      as a child  . Shoulder surgery    . Cholecystectomy    . Laminectomy     family history includes Alzheimer's disease in her mother; Asthma in her father; Cervical cancer in her paternal grandmother; Colon polyps in her paternal aunt; Heart disease in her maternal uncle; Leukemia in her father; Stroke in her maternal aunt. There is no history of Colon cancer, Esophageal cancer, or Gallbladder disease. Current Outpatient Prescriptions  Medication Sig Dispense Refill  . amitriptyline (ELAVIL) 10 MG tablet Take 20 mg by mouth at bedtime.    Marland Kitchen aspirin 81 MG tablet Take 81 mg by mouth daily.    . Calcium-Magnesium-Vitamin D (CALCIUM 500 PO) Take 2 tablets by mouth daily.    . Cholecalciferol (VITAMIN D3) 2000 UNITS TABS Take 1 tablet by mouth daily.    .  famotidine (PEPCID) 20 MG tablet One at bedtime 30 tablet 11  . fluticasone (FLONASE) 50 MCG/ACT nasal spray Place 2 sprays into both nostrils daily.    Marland Kitchen ipratropium-albuterol (DUONEB) 0.5-2.5 (3) MG/3ML SOLN Take 3 mLs by nebulization every 6 (six) hours as needed.    . Iron Combinations (IRON COMPLEX PO) Take 65 mg by mouth daily. Pt takes 2 tablets, by mouth, every day    . montelukast (SINGULAIR) 10 MG tablet Take 10 mg by mouth at bedtime.    . Multiple Vitamin (MULTIVITAMIN) capsule Take 1 capsule by mouth daily.    . pantoprazole (PROTONIX) 40 MG tablet Take 1 tablet (40 mg total) by mouth daily. Take 30-60 min before first meal of the day 30 tablet 2  . sertraline (ZOLOFT) 100 MG tablet Take 150 mg by mouth  daily. Pt takes 2 tablets, by mouth, every day    . vitamin B-12 (CYANOCOBALAMIN) 50 MCG tablet Take 50 mcg by mouth daily. Pt takes 2 gummies a day    . zolpidem (AMBIEN) 5 MG tablet Take 5 mg by mouth at bedtime as needed for sleep.     No current facility-administered medications for this visit.   Allergies as of 11/28/2014 - Review Complete 11/28/2014  Allergen Reaction Noted  . Paxil [paroxetine hcl] Itching 08/06/2014  . Tamiflu [oseltamivir phosphate]  08/06/2014    reports that she quit smoking about 31 years ago. Her smoking use included Cigarettes. She has a 60 pack-year smoking history. She has never used smokeless tobacco. She reports that she does not drink alcohol or use illicit drugs.   Review of Systems: Pertinent positive and negative review of systems were noted in the above HPI section. All other review of systems were otherwise negative.  Vital signs were reviewed in today's medical record Physical Exam: General: Well developed , well nourished, no acute distress Skin: anicteric Head: Normocephalic and atraumatic Eyes:  sclerae anicteric, EOMI Ears: Normal auditory acuity Mouth: No deformity or lesions Neck: Supple, no masses or thyromegaly Lungs: Clear  throughout to auscultation Heart: Regular rate and rhythm; no murmurs, rubs or bruits Abdomen: Soft, non tender and non distended. No masses, hepatosplenomegaly or hernias noted. Normal Bowel sounds Rectal:deferred Musculoskeletal: Symmetrical with no gross deformities  Skin: No lesions on visible extremities Pulses:  Normal pulses noted Extremities: No clubbing, cyanosis, edema or deformities noted Neurological: Alert oriented x 4, grossly nonfocal Cervical Nodes:  No significant cervical adenopathy Inguinal Nodes: No significant inguinal adenopathy Psychological:  Alert and cooperative. Normal mood and affect  See Assessment and Plan under Problem List

## 2014-11-30 ENCOUNTER — Other Ambulatory Visit: Payer: Self-pay

## 2014-11-30 DIAGNOSIS — K743 Primary biliary cirrhosis: Secondary | ICD-10-CM

## 2014-11-30 MED ORDER — URSODIOL 300 MG PO CAPS: ORAL_CAPSULE | ORAL | Status: DC

## 2014-12-03 ENCOUNTER — Encounter: Payer: Self-pay | Admitting: Internal Medicine

## 2014-12-03 ENCOUNTER — Ambulatory Visit (INDEPENDENT_AMBULATORY_CARE_PROVIDER_SITE_OTHER)
Admission: RE | Admit: 2014-12-03 | Discharge: 2014-12-03 | Disposition: A | Discharge status: Home / Self Care | Payer: Medicare Other | Source: Ambulatory Visit | Attending: Internal Medicine | Admitting: Internal Medicine

## 2014-12-03 ENCOUNTER — Ambulatory Visit (INDEPENDENT_AMBULATORY_CARE_PROVIDER_SITE_OTHER): Payer: Medicare Other | Admitting: Internal Medicine

## 2014-12-03 VITALS — BP 122/80 | HR 74 | Ht 63.0 in | Wt 177.0 lb

## 2014-12-03 DIAGNOSIS — J8410 Pulmonary fibrosis, unspecified: Secondary | ICD-10-CM | POA: Diagnosis not present

## 2014-12-03 DIAGNOSIS — R059 Cough, unspecified: Secondary | ICD-10-CM

## 2014-12-03 DIAGNOSIS — J8489 Other specified interstitial pulmonary diseases: Secondary | ICD-10-CM | POA: Diagnosis not present

## 2014-12-03 DIAGNOSIS — R05 Cough: Secondary | ICD-10-CM | POA: Diagnosis not present

## 2014-12-03 NOTE — Progress Notes (Signed)
Quick Note:  Spoke with pt and notified of results per Dr. Wert. Pt verbalized understanding and denied any questions.  ______ 

## 2014-12-03 NOTE — Progress Notes (Signed)
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   History of Present Illness  08/06/2014 1st [facility] office visit/ [name]  Already on tapering pred/ levaquin but not sure when started, did not bring meds in with her Chief Complaint  Patient presents with  . Pulmonary Consult    Referred by Dr. Burnett Sheng for eval of recurrent PNA. Pt states that she had PNA 4 x in 2015. She c/o prod cough with large amounts of green sputum. She also c/o SOB with "walking around, not too far, it's not severe".   says the main problem that doesn't seem to be responding is poor sleep and fatigue.  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  Main resp concern is constant sense of pnds but says it doesn't keep he up or result in any am exac of coughing. rec Finish up all your antibiotics and call us with the name of it and all other medications that are not on your med sheet today (ask for [name] at [phone]) GERD diet   schedule sinus CT> neg Please remember to go to the lab department downstairs for your tests - we will call you with the results when they are available. Call at the end of antibiotics if mucus is not clear - you need Prevnar13 if have not already received - one year after the other pneumonia shot       09/03/2014 f/u ov/[name] re: honeycombing/ ? Asp  Chief Complaint  Patient presents with  . Follow-up    pt states she has had improvement with her breathing  since last visit.  c/o sob with exertion.    Not limited by breathing from desired activities    rec Pantoprazole (protonix) 40 mg   Take 30-60 min before first meal of the day and Pepcid 20 mg one bedtime until return to office - this is the best way to tell whether stomach acid is contributing to your problem.  GERD diet      12/03/2014 f/u ov/[name] re: honeycombing / ? Asp >  f/u [name] planned Chief Complaint  Patient presents with  . Follow-up    SOB w/activity; cough is better; feels better   No am or noct coughing / really Not limited by breathing from desired activities     No obvious day to day or daytime variabilty or assoc excess or purulent mucus or cp or chest tightness, subjective wheeze or overt  hb symptoms. No unusual exp hx or h/o childhood pna/ asthma or knowledge of premature birth.  Sleeping ok without nocturnal  or early am exacerbation  of respiratory  c/o's or need for noct saba. Also denies any obvious fluctuation of symptoms with weather or environmental changes or other aggravating or alleviating factors except as outlined above   Current Medications, Allergies, Complete Past Medical History, Past Surgical History, Family History, and Social History were reviewed  in [employer] record.  ROS  The following are not active complaints unless bolded sore throat, dysphagia, dental problems, itching, sneezing,  nasal congestion or excess/ purulent secretions, ear ache,   fever, chills, sweats, unintended wt loss, pleuritic or exertional cp, hemoptysis,  orthopnea pnd or leg swelling, presyncope, palpitations, heartburn, abdominal pain, anorexia, nausea, vomiting, diarrhea  or change in bowel or urinary habits, change in stools or urine, dysuria,hematuria,  rash, arthralgias, visual complaints, headache, numbness weakness or ataxia or problems with walking or coordination,  change in mood/affect or memory.            Objective:   Physical  Exam  amb elederly wf nad       . Wt Readings from Last 3 Encounters:  12/03/14 177 lb (80.287 kg)  11/28/14 178 lb 8 oz (80.967 kg)  09/03/14 171 lb (77.565 kg)    Vital signs reviewed    HEENT: nl dentition, turbinates, and orophanx. Nl external ear canals without cough reflex   NECK :  without JVD/Nodes/TM/ nl carotid upstrokes bilaterally   LUNGS: no acc muscle use,  Min bilateraly insp crackles without cough on insp or exp maneuvers   CV:  RRR  no s3 or murmur or increase in P2, no edema   ABD:  soft and nontender with nl excursion in the supine position. No bruits or organomegaly, bowel sounds nl  MS:  warm without deformities, calf tenderness, cyanosis or clubbing  SKIN: warm and dry without lesions    NEURO:  alert, approp, no deficits     CXR PA and Lateral:   12/03/2014 :     I personally reviewed images and agree with radiology impression as follows:    There is no acute cardiopulmonary abnormality. 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

## 2014-12-03 NOTE — Patient Instructions (Addendum)
Please remember to go to the x-ray department downstairs for your tests - we will call you with the results when they are available.  Will defer to Dr [name] re how to manage your GERD  Please schedule a follow up visit in 6  months but call sooner if needed   Late add: did not go for pfts 11/28/14 will ask her to do this at 13 m f/u

## 2014-12-04 ENCOUNTER — Encounter: Payer: Self-pay | Admitting: Internal Medicine

## 2014-12-04 NOTE — Assessment & Plan Note (Addendum)
-   first dx 02/2012 with honeycombing in bases by CT   I had an extended discussion with the patient reviewing all relevant studies completed to date and  lasting 15 to 20 minutes of a 25 minute visit on the following ongoing concerns:   No clinical or radiographic progression while on rx for GERD.  Use of PPI is associated with improved survival time and with decreased radiologic fibrosis per King's study published in AJRCCM vol 184 p1390.  Dec 2011  This may not be cause and effect, but given how universally unimpressive  all the other drugs have been for pf,   rec start continue  ppi / diet/ lifestyle modification.

## 2014-12-04 NOTE — Assessment & Plan Note (Signed)
-   Allergy profile 08/06/2014 >  Eos -0    Ig E <2, neg RAST - Sinus Ct  08/15/2014 Well aerated paranasal sinuses without sinusitis.  Still favor upper airway cough syndrome over PF as cause for cough/ better or max rx for gerd/ f/u per Dr [name] planned

## 2014-12-05 ENCOUNTER — Encounter: Payer: Self-pay | Admitting: Internal Medicine

## 2014-12-05 ENCOUNTER — Other Ambulatory Visit: Payer: Self-pay | Admitting: Internal Medicine

## 2014-12-05 DIAGNOSIS — J8410 Pulmonary fibrosis, unspecified: Secondary | ICD-10-CM

## 2014-12-06 ENCOUNTER — Encounter: Payer: Self-pay | Admitting: Gastroenterology

## 2014-12-06 ENCOUNTER — Ambulatory Visit (AMBULATORY_SURGERY_CENTER): Payer: Medicare Other | Admitting: Gastroenterology

## 2014-12-06 VITALS — BP 129/68 | HR 67 | Temp 98.2°F | Resp 15 | Ht 62.0 in | Wt 178.0 lb

## 2014-12-06 DIAGNOSIS — R109 Unspecified abdominal pain: Secondary | ICD-10-CM | POA: Diagnosis not present

## 2014-12-06 DIAGNOSIS — K2970 Gastritis, unspecified, without bleeding: Secondary | ICD-10-CM

## 2014-12-06 DIAGNOSIS — R1031 Right lower quadrant pain: Secondary | ICD-10-CM

## 2014-12-06 DIAGNOSIS — J45909 Unspecified asthma, uncomplicated: Secondary | ICD-10-CM | POA: Diagnosis not present

## 2014-12-06 DIAGNOSIS — K2940 Chronic atrophic gastritis without bleeding: Secondary | ICD-10-CM | POA: Diagnosis not present

## 2014-12-06 MED ORDER — SODIUM CHLORIDE 0.9 % IV SOLN: 500.0000 mL | INTRAVENOUS | Status: DC

## 2014-12-06 NOTE — Progress Notes (Signed)
A/ox3 pleased with MAC, report to Tracy RN 

## 2014-12-06 NOTE — Progress Notes (Signed)
Called to room to assist during endoscopic procedure.  Patient ID and intended procedure confirmed with present staff. Received instructions for my participation in the procedure from the performing physician.  

## 2014-12-06 NOTE — Op Note (Signed)
[facility]  [address]. [place], [address]   ENDOSCOPY PROCEDURE REPORT  PATIENT: Sherry, Wang  MR#: [id number] BIRTHDATE: 1940/12/06 , 77  yrs. old GENDER: female ENDOSCOPIST: Inda Castle, MD REFERRED BY:  Tanda Rockers, M.D. PROCEDURE DATE:  12/06/2014 PROCEDURE:  EGD w/ biopsy ASA CLASS:     Class II INDICATIONS:  cough and abnormal CT of the GI tract.showing a thickened distal esophagus. MEDICATIONS: Monitored anesthesia care and Propofol 140 mg IV TOPICAL ANESTHETIC:  DESCRIPTION OF PROCEDURE: After the risks benefits and alternatives of the procedure were thoroughly explained, informed consent was obtained.  The LB [id number] endoscope was introduced through the mouth and advanced to the second portion of the duodenum , Without limitations.  The instrument was slowly withdrawn as the mucosa was fully examined.    STOMACH: Gastritis (inflammation) was found in the gastric fundus and gastric body. The mucosa appeared featureless.  Distensibility was normal.  Biopsies were taken.  Remainder of the upper GI tract including esophagus, distal stomach and duodenum were normal. Except for the findings listed, the EGD was otherwise normal. Retroflexed views revealed no abnormalities.     The scope was then withdrawn from the patient and the procedure completed.  COMPLICATIONS: There were no immediate complications.  ENDOSCOPIC IMPRESSION: nonspecific gastritis  RECOMMENDATIONS: continue Protonix and Pepcid  REPEAT EXAM:  eSigned:  Inda Castle, MD 12/06/2014 3:37 PM    CC:

## 2014-12-06 NOTE — Patient Instructions (Signed)
Impressions/recommendations:  Nonspecific gastritis (handout given)  Continue Pepcid and Protonix  YOU HAD AN ENDOSCOPIC PROCEDURE TODAY AT [facility]:   Refer to the procedure report that was given to you for any specific questions about what was found during the examination.  If the procedure report does not answer your questions, please call your gastroenterologist to clarify.  If you requested that your care partner not be given the details of your procedure findings, then the procedure report has been included in a sealed envelope for you to review at your convenience later.  YOU SHOULD EXPECT: Some feelings of bloating in the abdomen. Passage of more gas than usual.  Walking can help get rid of the air that was put into your GI tract during the procedure and reduce the bloating. If you had a lower endoscopy (such as a colonoscopy or flexible sigmoidoscopy) you may notice spotting of blood in your stool or on the toilet paper. If you underwent a bowel prep for your procedure, you may not have a normal bowel movement for a few days.  Please Note:  You might notice some irritation and congestion in your nose or some drainage.  This is from the oxygen used during your procedure.  There is no need for concern and it should clear up in a day or so.  SYMPTOMS TO REPORT IMMEDIATELY:   Following upper endoscopy (EGD)  Vomiting of blood or coffee ground material  New chest pain or pain under the shoulder blades  Painful or persistently difficult swallowing  New shortness of breath  Fever of 100F or higher  Black, tarry-looking stools  For urgent or emergent issues, a gastroenterologist can be reached at any hour by calling [phone].   DIET: Your first meal following the procedure should be a small meal and then it is ok to progress to your normal diet. Heavy or fried foods are harder to digest and may make you feel nauseous or bloated.  Likewise, meals heavy in dairy  and vegetables can increase bloating.  Drink plenty of fluids but you should avoid alcoholic beverages for 24 hours.  ACTIVITY:  You should plan to take it easy for the rest of today and you should NOT DRIVE or use heavy machinery until tomorrow (because of the sedation medicines used during the test).    FOLLOW UP: Our staff will call the number listed on your records the next business day following your procedure to check on you and address any questions or concerns that you may have regarding the information given to you following your procedure. If we do not reach you, we will leave a message.  However, if you are feeling well and you are not experiencing any problems, there is no need to return our call.  We will assume that you have returned to your regular daily activities without incident.  If any biopsies were taken you will be contacted by phone or by letter within the next 1-3 weeks.  Please call us at [phone] if you have not heard about the biopsies in 3 weeks.    SIGNATURES/CONFIDENTIALITY: You and/or your care partner have signed paperwork which will be entered into your electronic medical record.  These signatures attest to the fact that that the information above on your After Visit Summary has been reviewed and is understood.  Full responsibility of the confidentiality of this discharge information lies with you and/or your care-partner.

## 2014-12-07 ENCOUNTER — Ambulatory Visit (HOSPITAL_COMMUNITY)
Admission: RE | Admit: 2014-12-07 | Discharge: 2014-12-07 | Disposition: A | Discharge status: Home / Self Care | Payer: Medicare Other | Source: Ambulatory Visit | Attending: Gastroenterology | Admitting: Gastroenterology

## 2014-12-07 ENCOUNTER — Telehealth: Payer: Self-pay

## 2014-12-07 DIAGNOSIS — K743 Primary biliary cirrhosis: Secondary | ICD-10-CM | POA: Diagnosis not present

## 2014-12-07 DIAGNOSIS — D649 Anemia, unspecified: Secondary | ICD-10-CM | POA: Insufficient documentation

## 2014-12-07 DIAGNOSIS — Z9049 Acquired absence of other specified parts of digestive tract: Secondary | ICD-10-CM | POA: Diagnosis not present

## 2014-12-07 DIAGNOSIS — K745 Biliary cirrhosis, unspecified: Secondary | ICD-10-CM | POA: Diagnosis not present

## 2014-12-07 NOTE — Telephone Encounter (Signed)
Unable to reach patient for follow up call.

## 2014-12-10 DIAGNOSIS — H40013 Open angle with borderline findings, low risk, bilateral: Secondary | ICD-10-CM | POA: Diagnosis not present

## 2014-12-10 DIAGNOSIS — H3531 Nonexudative age-related macular degeneration: Secondary | ICD-10-CM | POA: Diagnosis not present

## 2014-12-11 ENCOUNTER — Encounter: Payer: Self-pay | Admitting: Gastroenterology

## 2015-01-03 DIAGNOSIS — M8589 Other specified disorders of bone density and structure, multiple sites: Secondary | ICD-10-CM | POA: Diagnosis not present

## 2015-01-03 DIAGNOSIS — F320 Major depressive disorder, single episode, mild: Secondary | ICD-10-CM | POA: Diagnosis not present

## 2015-01-03 DIAGNOSIS — E780 Pure hypercholesterolemia: Secondary | ICD-10-CM | POA: Diagnosis not present

## 2015-01-03 DIAGNOSIS — F5109 Other insomnia not due to a substance or known physiological condition: Secondary | ICD-10-CM | POA: Diagnosis not present

## 2015-01-03 DIAGNOSIS — D649 Anemia, unspecified: Secondary | ICD-10-CM | POA: Diagnosis not present

## 2015-01-03 DIAGNOSIS — K743 Primary biliary cirrhosis: Secondary | ICD-10-CM | POA: Diagnosis not present

## 2015-01-03 DIAGNOSIS — J309 Allergic rhinitis, unspecified: Secondary | ICD-10-CM | POA: Diagnosis not present

## 2015-01-19 ENCOUNTER — Other Ambulatory Visit: Payer: Self-pay | Admitting: Internal Medicine

## 2015-02-01 ENCOUNTER — Telehealth: Payer: Self-pay | Admitting: Gastroenterology

## 2015-02-04 DIAGNOSIS — E538 Deficiency of other specified B group vitamins: Secondary | ICD-10-CM | POA: Diagnosis not present

## 2015-02-05 NOTE — Telephone Encounter (Signed)
Patient requests her pathology results be faxed to her PCP Dr Greig Right at [phone]. The office number is [phone].

## 2015-02-06 DIAGNOSIS — H43811 Vitreous degeneration, right eye: Secondary | ICD-10-CM | POA: Diagnosis not present

## 2015-03-06 DIAGNOSIS — E538 Deficiency of other specified B group vitamins: Secondary | ICD-10-CM | POA: Diagnosis not present

## 2015-04-08 DIAGNOSIS — J069 Acute upper respiratory infection, unspecified: Secondary | ICD-10-CM | POA: Diagnosis not present

## 2015-04-08 DIAGNOSIS — E538 Deficiency of other specified B group vitamins: Secondary | ICD-10-CM | POA: Diagnosis not present

## 2015-04-11 DIAGNOSIS — M8589 Other specified disorders of bone density and structure, multiple sites: Secondary | ICD-10-CM | POA: Diagnosis not present

## 2015-04-11 DIAGNOSIS — K743 Primary biliary cirrhosis: Secondary | ICD-10-CM | POA: Diagnosis not present

## 2015-04-11 DIAGNOSIS — E780 Pure hypercholesterolemia: Secondary | ICD-10-CM | POA: Diagnosis not present

## 2015-04-11 DIAGNOSIS — J84113 Idiopathic non-specific interstitial pneumonitis: Secondary | ICD-10-CM | POA: Diagnosis not present

## 2015-04-12 DIAGNOSIS — J8410 Pulmonary fibrosis, unspecified: Secondary | ICD-10-CM | POA: Diagnosis not present

## 2015-04-12 DIAGNOSIS — J84113 Idiopathic non-specific interstitial pneumonitis: Secondary | ICD-10-CM | POA: Diagnosis not present

## 2015-04-12 DIAGNOSIS — R05 Cough: Secondary | ICD-10-CM | POA: Diagnosis not present

## 2015-04-12 DIAGNOSIS — J8489 Other specified interstitial pulmonary diseases: Secondary | ICD-10-CM | POA: Diagnosis not present

## 2015-04-18 DIAGNOSIS — J069 Acute upper respiratory infection, unspecified: Secondary | ICD-10-CM | POA: Diagnosis not present

## 2015-04-18 DIAGNOSIS — K219 Gastro-esophageal reflux disease without esophagitis: Secondary | ICD-10-CM | POA: Diagnosis not present

## 2015-04-18 DIAGNOSIS — J84113 Idiopathic non-specific interstitial pneumonitis: Secondary | ICD-10-CM | POA: Diagnosis not present

## 2015-06-10 ENCOUNTER — Ambulatory Visit: Payer: Medicare Other | Admitting: Gastroenterology

## 2015-06-24 DIAGNOSIS — J411 Mucopurulent chronic bronchitis: Secondary | ICD-10-CM | POA: Diagnosis not present

## 2015-06-27 ENCOUNTER — Ambulatory Visit: Payer: Medicare Other | Admitting: Internal Medicine

## 2015-06-28 ENCOUNTER — Ambulatory Visit: Payer: Medicare Other | Admitting: Internal Medicine

## 2015-07-02 DIAGNOSIS — E538 Deficiency of other specified B group vitamins: Secondary | ICD-10-CM | POA: Diagnosis not present

## 2015-07-15 DIAGNOSIS — K219 Gastro-esophageal reflux disease without esophagitis: Secondary | ICD-10-CM | POA: Diagnosis not present

## 2015-07-15 DIAGNOSIS — J411 Mucopurulent chronic bronchitis: Secondary | ICD-10-CM | POA: Diagnosis not present

## 2015-07-15 DIAGNOSIS — Z79899 Other long term (current) drug therapy: Secondary | ICD-10-CM | POA: Diagnosis not present

## 2015-07-15 DIAGNOSIS — J84113 Idiopathic non-specific interstitial pneumonitis: Secondary | ICD-10-CM | POA: Diagnosis not present

## 2015-07-15 DIAGNOSIS — E7800 Pure hypercholesterolemia, unspecified: Secondary | ICD-10-CM | POA: Diagnosis not present

## 2015-07-15 DIAGNOSIS — D649 Anemia, unspecified: Secondary | ICD-10-CM | POA: Diagnosis not present

## 2015-07-25 ENCOUNTER — Ambulatory Visit (INDEPENDENT_AMBULATORY_CARE_PROVIDER_SITE_OTHER): Payer: Medicare Other | Admitting: Gastroenterology

## 2015-07-25 ENCOUNTER — Other Ambulatory Visit (INDEPENDENT_AMBULATORY_CARE_PROVIDER_SITE_OTHER): Payer: Medicare Other

## 2015-07-25 ENCOUNTER — Encounter: Payer: Self-pay | Admitting: Gastroenterology

## 2015-07-25 VITALS — BP 126/74 | HR 72 | Ht 62.75 in | Wt 180.5 lb

## 2015-07-25 DIAGNOSIS — K7469 Other cirrhosis of liver: Secondary | ICD-10-CM

## 2015-07-25 DIAGNOSIS — D649 Anemia, unspecified: Secondary | ICD-10-CM | POA: Diagnosis not present

## 2015-07-25 DIAGNOSIS — K7460 Unspecified cirrhosis of liver: Secondary | ICD-10-CM | POA: Diagnosis not present

## 2015-07-25 LAB — BASIC METABOLIC PANEL
BUN: 16 mg/dL (ref 6–23)
CHLORIDE: 103 meq/L (ref 96–112)
CO2: 29 meq/L (ref 19–32)
Calcium: 9.6 mg/dL (ref 8.4–10.5)
Creatinine, Ser: 1 mg/dL (ref 0.40–1.20)
GFR: 57.7 mL/min — AB (ref >60.00)
GLUCOSE: 94 mg/dL (ref 70–99)
POTASSIUM: 4 meq/L (ref 3.5–5.1)
SODIUM: 139 meq/L (ref 135–145)

## 2015-07-25 LAB — CBC WITH DIFFERENTIAL/PLATELET
BASOS PCT: 0.7 % (ref 0.0–3.0)
Basophils Absolute: 0 10*3/uL (ref 0.0–0.1)
EOS PCT: 3.7 % (ref 0.0–5.0)
Eosinophils Absolute: 0.2 10*3/uL (ref 0.0–0.7)
HCT: 41.2 % (ref 36.0–46.0)
Hemoglobin: 13.7 g/dL (ref 12.0–15.0)
LYMPHS ABS: 1.2 10*3/uL (ref 0.7–4.0)
Lymphocytes Relative: 18.8 % (ref 12.0–46.0)
MCHC: 33.2 g/dL (ref 30.0–36.0)
MCV: 83.2 fl (ref 78.0–100.0)
MONO ABS: 0.4 10*3/uL (ref 0.1–1.0)
MONOS PCT: 6.5 % (ref 3.0–12.0)
NEUTROS ABS: 4.5 10*3/uL (ref 1.4–7.7)
NEUTROS PCT: 70.3 % (ref 43.0–77.0)
Platelets: 239 10*3/uL (ref 150.0–400.0)
RBC: 4.95 Mil/uL (ref 3.87–5.11)
RDW: 14.7 % (ref 11.5–15.5)
WBC: 6.4 10*3/uL (ref 4.0–10.5)

## 2015-07-25 LAB — FERRITIN: FERRITIN: 18 ng/mL (ref 10.0–291.0)

## 2015-07-25 LAB — VITAMIN B12: Vitamin B-12: 498 pg/mL (ref 211–911)

## 2015-07-25 LAB — FOLATE: Folate: >23.8 ng/mL (ref >5.9)

## 2015-07-25 NOTE — Progress Notes (Signed)
Sherry Wang    [id number]    12-12-1940  Primary Care Physician:PENNER,PAMELA, MD  Referring Physician: Greig Right, MD [address] [place], [place] [address]  Chief complaint:   cirrhosis HPI:  74 year old female with history of PBC here for follow-up visit.  Patient reports noticing some weight gain and increased abdominal girth in the past few months.  No dyspnea or orthopnea.  Denies any nausea, vomiting, abdominal pain, melena or bright red blood per rectum  Otherwise overall she feels well and has no specific complaints   Outpatient Encounter Prescriptions as of 07/25/2015  Medication Sig  . amitriptyline (ELAVIL) 10 MG tablet Take 20 mg by mouth at bedtime.  Marland Kitchen aspirin 81 MG tablet Take 81 mg by mouth daily.  . Calcium-Magnesium-Vitamin D (CALCIUM 500 PO) Take 2 tablets by mouth daily.  . Cholecalciferol (VITAMIN D3) 2000 UNITS TABS Take 1 tablet by mouth daily.  Marland Kitchen DEXILANT 60 MG capsule Take 1 tablet by mouth daily.  . famotidine (PEPCID) 20 MG tablet One at bedtime  . fluticasone (FLONASE) 50 MCG/ACT nasal spray Place 2 sprays into both nostrils daily.  Marland Kitchen ipratropium-albuterol (DUONEB) 0.5-2.5 (3) MG/3ML SOLN Take 3 mLs by nebulization every 6 (six) hours as needed.  . Iron Combinations (IRON COMPLEX PO) Take 65 mg by mouth daily. Pt takes 2 tablets, by mouth, every day  . montelukast (SINGULAIR) 10 MG tablet Take 10 mg by mouth at bedtime.  . Multiple Vitamin (MULTIVITAMIN) capsule Take 1 capsule by mouth daily.  . sertraline (ZOLOFT) 100 MG tablet Take 150 mg by mouth daily. Pt takes 2 tablets, by mouth, every day  . vitamin B-12 (CYANOCOBALAMIN) 50 MCG tablet Take 50 mcg by mouth daily. Pt takes 2 gummies a day  . zolpidem (AMBIEN) 5 MG tablet Take 5 mg by mouth at bedtime as needed for sleep.  . pantoprazole (PROTONIX) 40 MG tablet TAKE ONE TABLET BY MOUTH ONCE DAILY. TAKE  30-60  MINUTES  BEFORE  FIRST  MEAL  OF  THE  DAY (Patient not taking:  Reported on 07/25/2015)  . [DISCONTINUED] ursodiol (ACTIGALL) 300 MG capsule Take 2 capsules twice daily   No facility-administered encounter medications on file as of 07/25/2015.    Allergies as of 07/25/2015 - Review Complete 07/25/2015  Allergen Reaction Noted  . Paxil [paroxetine hcl] Itching 08/06/2014  . Tamiflu [oseltamivir phosphate]  08/06/2014    Past Medical History  Diagnosis Date  . Pneumonia   . Hyperlipemia   . Biliary cirrhosis ([employer])   . Anemia   . AVM (arteriovenous malformation) of small bowel, acquired ([place])   . Anxiety   . Gallstones   . GERD (gastroesophageal reflux disease)   . GI bleeding   . Anemia, iron deficiency 11/28/2014  . 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 Tonsillectomy      as a child  . Shoulder surgery    . Cholecystectomy    . Laminectomy      Family History  Problem Relation Age of Onset  . Asthma Father   . Leukemia Father     died age 80  . Cervical cancer Paternal Grandmother   . Alzheimer's disease Mother   . Heart disease Maternal Uncle     x2  . Colon cancer Neg Hx   . Colon polyps Paternal Aunt   . Esophageal cancer Neg Hx   .  Gallbladder disease Neg Hx   . Stroke Maternal Aunt     Social History   Social History  . Marital Status: Single    Spouse Name: N/A  . Number of Children: 1  . Years of Education: N/A   Occupational History  . Retired Therapist, sports     Social History Main Topics  . Smoking status: Former Smoker -- 2.00 packs/day for 30 years    Types: Cigarettes    Quit date: 08/18/1983  . Smokeless tobacco: Never Used  . Alcohol Use: No  . Drug Use: No  . Sexual Activity: Not on file   Other Topics Concern  . Not on file   Social History Narrative      Review of systems: Review of Systems  Constitutional: Negative for fever and chills.  HENT: Negative.   Eyes: Negative for blurred vision.  Respiratory: Negative for cough, shortness of  breath and wheezing.   Cardiovascular: Negative for chest pain and palpitations.  Gastrointestinal: as per HPI Genitourinary: Negative for dysuria, urgency, frequency and hematuria.  Musculoskeletal: Negative for myalgias, back pain and joint pain.  Skin: Negative for itching and rash.  Neurological: Negative for dizziness, tremors, focal weakness, seizures and loss of consciousness.  Endo/Heme/Allergies: Negative for environmental allergies.  Psychiatric/Behavioral: Negative for depression, suicidal ideas and hallucinations.  All other systems reviewed and are negative.   Physical Exam: Filed Vitals:   07/25/15 0944  BP: 126/74  Pulse: 72   Gen:      No acute distress HEENT:  EOMI, sclera anicteric Neck:     No masses; no thyromegaly Lungs:    Clear to auscultation bilaterally; normal respiratory effort CV:         Regular rate and rhythm; no murmurs Abd:      + bowel sounds; soft, non-tender; no palpable masses,mild distension with fluid shift Ext:    1 edema; adequate peripheral perfusion Skin:      Warm and dry; no rash Neuro: alert and oriented x 3 Psych: normal mood and affect  Data Reviewed:  reviewed labs and chart  EGD 11/2014 Gastritis (inflammation) was found in the gastric fundus and gastric body. The mucosa appeared featureless. Distensibility was normal. Biopsies were taken. Remainder of the upper GI tract including esophagus, distal stomach and duodenum were normal.  Assessment and Plan/Recommendations:  74 year old female with history of PBC here for follow-up visit  We'll obtain CBC,  BMP,  LFT,  and AFP  She is due for abdominal ultrasound for HCC screening  No evidence of varices on EGD in 2016,  Due for screening for varices in 2018  We'll start low-dose spironolactone 25 mg daily and obtain repeat BMP in 1 week  Return in a month  K. Denzil Magnuson , MD [phone] Mon-Fri 8a-5p [phone] after 5p, weekends, holidays

## 2015-07-25 NOTE — Patient Instructions (Addendum)
Go to the basement for labs today  You have been scheduled for an abdominal ultrasound at [facility] (1st floor of hospital) on  08/02/2015  at  8:30am. Please arrive 15 minutes prior to your appointment for registration. Make certain not to have anything to eat or drink 6 hours prior to your appointment. Should you need to reschedule your appointment, please contact radiology at [phone]. This test typically takes about 30 minutes to perform.  Repeat Labs (BMP) in one 1 week We are sending in your Aldactone to your pharmacy today

## 2015-07-26 ENCOUNTER — Telehealth: Payer: Self-pay | Admitting: Gastroenterology

## 2015-07-26 LAB — AFP TUMOR MARKER: AFP-Tumor Marker: 7.8 ng/mL — ABNORMAL HIGH (ref <6.1)

## 2015-07-26 MED ORDER — SPIRONOLACTONE 25 MG PO TABS
25.0000 mg | ORAL_TABLET | Freq: Every day | ORAL | Status: DC
Start: 2015-07-26 — End: 2016-06-16

## 2015-07-26 NOTE — Telephone Encounter (Signed)
Per Dr Silverio Decamp she was waiting on the results of labs before aldactone was prescribed. Dr Silverio Decamp sending script today  Per Dr Silverio Decamp, Labs normal except for elevated AFP  Will scheduled MR Liver and Cancel the abdominal US    MRI now scheduled for 08/09/2015 at 8am    L/M for patient to return  my call

## 2015-07-29 DIAGNOSIS — R05 Cough: Secondary | ICD-10-CM | POA: Diagnosis not present

## 2015-07-29 DIAGNOSIS — J4520 Mild intermittent asthma, uncomplicated: Secondary | ICD-10-CM | POA: Diagnosis not present

## 2015-07-29 DIAGNOSIS — G2581 Restless legs syndrome: Secondary | ICD-10-CM | POA: Diagnosis not present

## 2015-07-29 DIAGNOSIS — J84112 Idiopathic pulmonary fibrosis: Secondary | ICD-10-CM | POA: Diagnosis not present

## 2015-07-30 DIAGNOSIS — R05 Cough: Secondary | ICD-10-CM | POA: Diagnosis not present

## 2015-07-30 DIAGNOSIS — E559 Vitamin D deficiency, unspecified: Secondary | ICD-10-CM | POA: Diagnosis not present

## 2015-07-30 DIAGNOSIS — J4520 Mild intermittent asthma, uncomplicated: Secondary | ICD-10-CM | POA: Diagnosis not present

## 2015-07-31 ENCOUNTER — Other Ambulatory Visit: Payer: Self-pay

## 2015-07-31 ENCOUNTER — Other Ambulatory Visit (INDEPENDENT_AMBULATORY_CARE_PROVIDER_SITE_OTHER): Payer: Medicare Other

## 2015-07-31 DIAGNOSIS — R05 Cough: Secondary | ICD-10-CM | POA: Diagnosis not present

## 2015-07-31 DIAGNOSIS — K7469 Other cirrhosis of liver: Secondary | ICD-10-CM

## 2015-07-31 DIAGNOSIS — R918 Other nonspecific abnormal finding of lung field: Secondary | ICD-10-CM | POA: Diagnosis not present

## 2015-07-31 DIAGNOSIS — J84112 Idiopathic pulmonary fibrosis: Secondary | ICD-10-CM | POA: Diagnosis not present

## 2015-07-31 LAB — BASIC METABOLIC PANEL
BUN: 18 mg/dL (ref 6–23)
CHLORIDE: 102 meq/L (ref 96–112)
CO2: 29 mEq/L (ref 19–32)
Calcium: 9.7 mg/dL (ref 8.4–10.5)
Creatinine, Ser: 1.03 mg/dL (ref 0.40–1.20)
GFR: 55.77 mL/min — AB (ref >60.00)
GLUCOSE: 97 mg/dL (ref 70–99)
Potassium: 4.8 mEq/L (ref 3.5–5.1)
Sodium: 137 mEq/L (ref 135–145)

## 2015-08-02 ENCOUNTER — Ambulatory Visit (HOSPITAL_COMMUNITY): Payer: Medicare Other

## 2015-08-06 DIAGNOSIS — G4733 Obstructive sleep apnea (adult) (pediatric): Secondary | ICD-10-CM | POA: Diagnosis not present

## 2015-08-06 DIAGNOSIS — J84112 Idiopathic pulmonary fibrosis: Secondary | ICD-10-CM | POA: Diagnosis not present

## 2015-08-06 DIAGNOSIS — G2581 Restless legs syndrome: Secondary | ICD-10-CM | POA: Diagnosis not present

## 2015-08-06 DIAGNOSIS — J4520 Mild intermittent asthma, uncomplicated: Secondary | ICD-10-CM | POA: Diagnosis not present

## 2015-08-06 DIAGNOSIS — R5383 Other fatigue: Secondary | ICD-10-CM | POA: Diagnosis not present

## 2015-08-09 ENCOUNTER — Ambulatory Visit (HOSPITAL_COMMUNITY)
Admission: RE | Admit: 2015-08-09 | Discharge: 2015-08-09 | Disposition: A | Discharge status: Home / Self Care | Payer: Medicare Other | Source: Ambulatory Visit | Attending: Gastroenterology | Admitting: Gastroenterology

## 2015-08-09 DIAGNOSIS — K7469 Other cirrhosis of liver: Secondary | ICD-10-CM | POA: Insufficient documentation

## 2015-08-09 DIAGNOSIS — Z9049 Acquired absence of other specified parts of digestive tract: Secondary | ICD-10-CM | POA: Insufficient documentation

## 2015-08-09 DIAGNOSIS — K7460 Unspecified cirrhosis of liver: Secondary | ICD-10-CM | POA: Diagnosis not present

## 2015-08-09 MED ORDER — GADOBENATE DIMEGLUMINE 529 MG/ML IV SOLN
20.0000 mL | Freq: Once | INTRAVENOUS | Status: AC | PRN
  Administered 2015-08-09: 17 mL via INTRAVENOUS

## 2015-08-23 DIAGNOSIS — I252 Old myocardial infarction: Secondary | ICD-10-CM | POA: Diagnosis not present

## 2015-08-23 DIAGNOSIS — E785 Hyperlipidemia, unspecified: Secondary | ICD-10-CM | POA: Diagnosis not present

## 2015-08-23 DIAGNOSIS — Z9889 Other specified postprocedural states: Secondary | ICD-10-CM | POA: Diagnosis not present

## 2015-08-23 DIAGNOSIS — G4700 Insomnia, unspecified: Secondary | ICD-10-CM | POA: Diagnosis not present

## 2015-08-23 DIAGNOSIS — J479 Bronchiectasis, uncomplicated: Secondary | ICD-10-CM | POA: Diagnosis not present

## 2015-08-23 DIAGNOSIS — K219 Gastro-esophageal reflux disease without esophagitis: Secondary | ICD-10-CM | POA: Diagnosis not present

## 2015-08-23 DIAGNOSIS — J84112 Idiopathic pulmonary fibrosis: Secondary | ICD-10-CM | POA: Diagnosis not present

## 2015-08-23 DIAGNOSIS — G2581 Restless legs syndrome: Secondary | ICD-10-CM | POA: Diagnosis not present

## 2015-08-23 DIAGNOSIS — J4520 Mild intermittent asthma, uncomplicated: Secondary | ICD-10-CM | POA: Diagnosis not present

## 2015-08-23 DIAGNOSIS — Z79899 Other long term (current) drug therapy: Secondary | ICD-10-CM | POA: Diagnosis not present

## 2015-08-23 DIAGNOSIS — F329 Major depressive disorder, single episode, unspecified: Secondary | ICD-10-CM | POA: Diagnosis not present

## 2015-08-23 DIAGNOSIS — J8410 Pulmonary fibrosis, unspecified: Secondary | ICD-10-CM | POA: Diagnosis not present

## 2015-08-23 DIAGNOSIS — Z9049 Acquired absence of other specified parts of digestive tract: Secondary | ICD-10-CM | POA: Diagnosis not present

## 2015-08-23 DIAGNOSIS — Z87891 Personal history of nicotine dependence: Secondary | ICD-10-CM | POA: Diagnosis not present

## 2015-08-23 DIAGNOSIS — I83813 Varicose veins of bilateral lower extremities with pain: Secondary | ICD-10-CM | POA: Diagnosis not present

## 2015-09-06 DIAGNOSIS — J4520 Mild intermittent asthma, uncomplicated: Secondary | ICD-10-CM | POA: Diagnosis not present

## 2015-09-09 ENCOUNTER — Other Ambulatory Visit: Payer: Self-pay | Admitting: Internal Medicine

## 2015-09-16 DIAGNOSIS — J4520 Mild intermittent asthma, uncomplicated: Secondary | ICD-10-CM | POA: Diagnosis not present

## 2015-09-16 DIAGNOSIS — G2581 Restless legs syndrome: Secondary | ICD-10-CM | POA: Diagnosis not present

## 2015-09-16 DIAGNOSIS — R5383 Other fatigue: Secondary | ICD-10-CM | POA: Diagnosis not present

## 2015-09-16 DIAGNOSIS — J84112 Idiopathic pulmonary fibrosis: Secondary | ICD-10-CM | POA: Diagnosis not present

## 2015-09-26 DIAGNOSIS — R05 Cough: Secondary | ICD-10-CM | POA: Diagnosis not present

## 2015-09-26 DIAGNOSIS — R5383 Other fatigue: Secondary | ICD-10-CM | POA: Diagnosis not present

## 2015-09-26 DIAGNOSIS — J84112 Idiopathic pulmonary fibrosis: Secondary | ICD-10-CM | POA: Diagnosis not present

## 2015-09-26 DIAGNOSIS — J4520 Mild intermittent asthma, uncomplicated: Secondary | ICD-10-CM | POA: Diagnosis not present

## 2015-10-01 ENCOUNTER — Ambulatory Visit: Payer: Medicare Other | Admitting: Gastroenterology

## 2015-10-01 DIAGNOSIS — R05 Cough: Secondary | ICD-10-CM | POA: Diagnosis not present

## 2015-10-01 DIAGNOSIS — I081 Rheumatic disorders of both mitral and tricuspid valves: Secondary | ICD-10-CM | POA: Diagnosis not present

## 2015-10-01 DIAGNOSIS — R0600 Dyspnea, unspecified: Secondary | ICD-10-CM | POA: Diagnosis not present

## 2015-10-14 DIAGNOSIS — R05 Cough: Secondary | ICD-10-CM | POA: Diagnosis not present

## 2015-10-14 DIAGNOSIS — J84112 Idiopathic pulmonary fibrosis: Secondary | ICD-10-CM | POA: Diagnosis not present

## 2015-10-14 DIAGNOSIS — J4520 Mild intermittent asthma, uncomplicated: Secondary | ICD-10-CM | POA: Diagnosis not present

## 2015-10-14 DIAGNOSIS — G2581 Restless legs syndrome: Secondary | ICD-10-CM | POA: Diagnosis not present

## 2015-10-14 DIAGNOSIS — R5383 Other fatigue: Secondary | ICD-10-CM | POA: Diagnosis not present

## 2015-10-22 DIAGNOSIS — S39012A Strain of muscle, fascia and tendon of lower back, initial encounter: Secondary | ICD-10-CM | POA: Diagnosis not present

## 2015-10-22 DIAGNOSIS — M9903 Segmental and somatic dysfunction of lumbar region: Secondary | ICD-10-CM | POA: Diagnosis not present

## 2015-10-22 DIAGNOSIS — M5136 Other intervertebral disc degeneration, lumbar region: Secondary | ICD-10-CM | POA: Diagnosis not present

## 2015-10-22 DIAGNOSIS — M9904 Segmental and somatic dysfunction of sacral region: Secondary | ICD-10-CM | POA: Diagnosis not present

## 2015-10-23 DIAGNOSIS — M9903 Segmental and somatic dysfunction of lumbar region: Secondary | ICD-10-CM | POA: Diagnosis not present

## 2015-10-23 DIAGNOSIS — S39012A Strain of muscle, fascia and tendon of lower back, initial encounter: Secondary | ICD-10-CM | POA: Diagnosis not present

## 2015-10-23 DIAGNOSIS — M5136 Other intervertebral disc degeneration, lumbar region: Secondary | ICD-10-CM | POA: Diagnosis not present

## 2015-10-23 DIAGNOSIS — M9904 Segmental and somatic dysfunction of sacral region: Secondary | ICD-10-CM | POA: Diagnosis not present

## 2015-10-24 DIAGNOSIS — S39012A Strain of muscle, fascia and tendon of lower back, initial encounter: Secondary | ICD-10-CM | POA: Diagnosis not present

## 2015-10-24 DIAGNOSIS — M9904 Segmental and somatic dysfunction of sacral region: Secondary | ICD-10-CM | POA: Diagnosis not present

## 2015-10-24 DIAGNOSIS — M5136 Other intervertebral disc degeneration, lumbar region: Secondary | ICD-10-CM | POA: Diagnosis not present

## 2015-10-24 DIAGNOSIS — M9903 Segmental and somatic dysfunction of lumbar region: Secondary | ICD-10-CM | POA: Diagnosis not present

## 2015-10-28 DIAGNOSIS — S39012A Strain of muscle, fascia and tendon of lower back, initial encounter: Secondary | ICD-10-CM | POA: Diagnosis not present

## 2015-10-28 DIAGNOSIS — M5136 Other intervertebral disc degeneration, lumbar region: Secondary | ICD-10-CM | POA: Diagnosis not present

## 2015-10-28 DIAGNOSIS — M9903 Segmental and somatic dysfunction of lumbar region: Secondary | ICD-10-CM | POA: Diagnosis not present

## 2015-10-28 DIAGNOSIS — M9904 Segmental and somatic dysfunction of sacral region: Secondary | ICD-10-CM | POA: Diagnosis not present

## 2015-10-30 DIAGNOSIS — M9904 Segmental and somatic dysfunction of sacral region: Secondary | ICD-10-CM | POA: Diagnosis not present

## 2015-10-30 DIAGNOSIS — M9903 Segmental and somatic dysfunction of lumbar region: Secondary | ICD-10-CM | POA: Diagnosis not present

## 2015-10-30 DIAGNOSIS — S39012A Strain of muscle, fascia and tendon of lower back, initial encounter: Secondary | ICD-10-CM | POA: Diagnosis not present

## 2015-10-30 DIAGNOSIS — M5136 Other intervertebral disc degeneration, lumbar region: Secondary | ICD-10-CM | POA: Diagnosis not present

## 2015-10-31 DIAGNOSIS — S39012A Strain of muscle, fascia and tendon of lower back, initial encounter: Secondary | ICD-10-CM | POA: Diagnosis not present

## 2015-10-31 DIAGNOSIS — M9903 Segmental and somatic dysfunction of lumbar region: Secondary | ICD-10-CM | POA: Diagnosis not present

## 2015-10-31 DIAGNOSIS — M9904 Segmental and somatic dysfunction of sacral region: Secondary | ICD-10-CM | POA: Diagnosis not present

## 2015-10-31 DIAGNOSIS — M5136 Other intervertebral disc degeneration, lumbar region: Secondary | ICD-10-CM | POA: Diagnosis not present

## 2015-11-04 DIAGNOSIS — M5136 Other intervertebral disc degeneration, lumbar region: Secondary | ICD-10-CM | POA: Diagnosis not present

## 2015-11-04 DIAGNOSIS — M9903 Segmental and somatic dysfunction of lumbar region: Secondary | ICD-10-CM | POA: Diagnosis not present

## 2015-11-04 DIAGNOSIS — M9904 Segmental and somatic dysfunction of sacral region: Secondary | ICD-10-CM | POA: Diagnosis not present

## 2015-11-04 DIAGNOSIS — S39012A Strain of muscle, fascia and tendon of lower back, initial encounter: Secondary | ICD-10-CM | POA: Diagnosis not present

## 2015-11-07 DIAGNOSIS — M5136 Other intervertebral disc degeneration, lumbar region: Secondary | ICD-10-CM | POA: Diagnosis not present

## 2015-11-07 DIAGNOSIS — M9904 Segmental and somatic dysfunction of sacral region: Secondary | ICD-10-CM | POA: Diagnosis not present

## 2015-11-07 DIAGNOSIS — S39012A Strain of muscle, fascia and tendon of lower back, initial encounter: Secondary | ICD-10-CM | POA: Diagnosis not present

## 2015-11-07 DIAGNOSIS — M9903 Segmental and somatic dysfunction of lumbar region: Secondary | ICD-10-CM | POA: Diagnosis not present

## 2015-11-19 ENCOUNTER — Other Ambulatory Visit (INDEPENDENT_AMBULATORY_CARE_PROVIDER_SITE_OTHER): Payer: Medicare Other

## 2015-11-19 ENCOUNTER — Encounter: Payer: Self-pay | Admitting: Gastroenterology

## 2015-11-19 ENCOUNTER — Ambulatory Visit (INDEPENDENT_AMBULATORY_CARE_PROVIDER_SITE_OTHER): Payer: Medicare Other | Admitting: Gastroenterology

## 2015-11-19 VITALS — BP 116/68 | HR 72 | Ht 62.75 in | Wt 178.4 lb

## 2015-11-19 DIAGNOSIS — R600 Localized edema: Secondary | ICD-10-CM | POA: Diagnosis not present

## 2015-11-19 DIAGNOSIS — K7469 Other cirrhosis of liver: Secondary | ICD-10-CM | POA: Diagnosis not present

## 2015-11-19 DIAGNOSIS — K743 Primary biliary cirrhosis: Secondary | ICD-10-CM | POA: Diagnosis not present

## 2015-11-19 LAB — CBC WITH DIFFERENTIAL/PLATELET
BASOS ABS: 0 10*3/uL (ref 0.0–0.1)
Basophils Relative: 0.4 % (ref 0.0–3.0)
EOS ABS: 0.3 10*3/uL (ref 0.0–0.7)
Eosinophils Relative: 4.2 % (ref 0.0–5.0)
HCT: 40 % (ref 36.0–46.0)
Hemoglobin: 13.3 g/dL (ref 12.0–15.0)
LYMPHS ABS: 1.3 10*3/uL (ref 0.7–4.0)
Lymphocytes Relative: 19.1 % (ref 12.0–46.0)
MCHC: 33.3 g/dL (ref 30.0–36.0)
MCV: 82.4 fl (ref 78.0–100.0)
MONO ABS: 0.5 10*3/uL (ref 0.1–1.0)
Monocytes Relative: 7 % (ref 3.0–12.0)
NEUTROS ABS: 4.6 10*3/uL (ref 1.4–7.7)
NEUTROS PCT: 69.3 % (ref 43.0–77.0)
PLATELETS: 253 10*3/uL (ref 150.0–400.0)
RBC: 4.86 Mil/uL (ref 3.87–5.11)
RDW: 16.6 % — ABNORMAL HIGH (ref 11.5–15.5)
WBC: 6.6 10*3/uL (ref 4.0–10.5)

## 2015-11-19 LAB — PROTIME-INR
INR: 1.1 ratio — AB (ref 0.8–1.0)
PROTHROMBIN TIME: 11.3 s (ref 9.6–13.1)

## 2015-11-19 LAB — BASIC METABOLIC PANEL
BUN: 13 mg/dL (ref 6–23)
CHLORIDE: 103 meq/L (ref 96–112)
CO2: 32 meq/L (ref 19–32)
Calcium: 9.8 mg/dL (ref 8.4–10.5)
Creatinine, Ser: 0.88 mg/dL (ref 0.40–1.20)
GFR: 66.82 mL/min (ref >60.00)
Glucose, Bld: 88 mg/dL (ref 70–99)
POTASSIUM: 4.3 meq/L (ref 3.5–5.1)
Sodium: 138 mEq/L (ref 135–145)

## 2015-11-19 LAB — HEPATIC FUNCTION PANEL
ALBUMIN: 4 g/dL (ref 3.5–5.2)
ALT: 17 U/L (ref 0–35)
AST: 27 U/L (ref 0–37)
Alkaline Phosphatase: 142 U/L — ABNORMAL HIGH (ref 39–117)
BILIRUBIN TOTAL: 0.4 mg/dL (ref 0.2–1.2)
Bilirubin, Direct: 0 mg/dL (ref 0.0–0.3)
Total Protein: 7.3 g/dL (ref 6.0–8.3)

## 2015-11-19 NOTE — Patient Instructions (Signed)
Go to the basement today for labs Follow up in 6 months

## 2015-11-20 NOTE — Progress Notes (Signed)
Sherry Wang    [id number]    03-Sep-1940  Primary Care Physician:PENNER,PAMELA, MD  Referring Physician: Greig Right, MD [address] [place], [place] [address]  Chief complaint:  PBC HPI:  75 year old female with history of PBC here for follow-up visit. She is doing well overall with stable weight and decrease in ankle edema after starting low-dose Aldactone   No dyspnea or orthopnea.  Denies any nausea, vomiting, abdominal pain, melena or bright red blood per rectum. Denies any confusion or tremors No other complaints during this visit    Outpatient Encounter Prescriptions as of 11/19/2015  Medication Sig  . amitriptyline (ELAVIL) 10 MG tablet Take 20 mg by mouth at bedtime.  Marland Kitchen aspirin 81 MG tablet Take 81 mg by mouth daily.  . Calcium-Magnesium-Vitamin D (CALCIUM 500 PO) Take 2 tablets by mouth daily.  . Cholecalciferol (VITAMIN D3) 2000 UNITS TABS Take 1 tablet by mouth daily.  Marland Kitchen DEXILANT 60 MG capsule Take 1 tablet by mouth daily.  . famotidine (PEPCID) 20 MG tablet One at bedtime  . fluticasone (FLONASE) 50 MCG/ACT nasal spray Place 2 sprays into both nostrils daily.  Marland Kitchen ipratropium-albuterol (DUONEB) 0.5-2.5 (3) MG/3ML SOLN Take 3 mLs by nebulization every 6 (six) hours as needed.  . Iron Combinations (IRON COMPLEX PO) Take 65 mg by mouth daily. Pt takes 2 tablets, by mouth, every day  . montelukast (SINGULAIR) 10 MG tablet Take 10 mg by mouth at bedtime.  . Multiple Vitamin (MULTIVITAMIN) capsule Take 1 capsule by mouth daily.  . pantoprazole (PROTONIX) 40 MG tablet TAKE ONE TABLET BY MOUTH ONCE DAILY. TAKE  30-60  MINUTES  BEFORE  FIRST  MEAL  OF  THE  DAY  . sertraline (ZOLOFT) 100 MG tablet Take 150 mg by mouth daily. Pt takes 2 tablets, by mouth, every day  . spironolactone (ALDACTONE) 25 MG tablet Take 1 tablet (25 mg total) by mouth daily.  . vitamin B-12 (CYANOCOBALAMIN) 50 MCG tablet Take 50 mcg by mouth daily. Pt takes 2 gummies a day  .  zolpidem (AMBIEN) 5 MG tablet Take 5 mg by mouth at bedtime as needed for sleep.   No facility-administered encounter medications on file as of 11/19/2015.    Allergies as of 11/19/2015 - Review Complete 11/19/2015  Allergen Reaction Noted  . Paxil [paroxetine hcl] Itching 08/06/2014  . Tamiflu [oseltamivir phosphate]  08/06/2014    Past Medical History  Diagnosis Date  . Pneumonia   . Hyperlipemia   . Biliary cirrhosis ([place])   . Anemia   . AVM (arteriovenous malformation) of small bowel, acquired ([place])   . Anxiety   . Gallstones   . GERD (gastroesophageal reflux disease)   . GI bleeding   . Anemia, iron deficiency 11/28/2014  . 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 Tonsillectomy      as a child  . Shoulder surgery    . Cholecystectomy    . Laminectomy      Family History  Problem Relation Age of Onset  . Asthma Father   . Leukemia Father     died age 44  . Cervical cancer Paternal Grandmother   . Alzheimer's disease Mother   . Heart disease Maternal Uncle     x2  . Colon cancer Neg Hx   . Colon polyps Paternal Aunt   . Esophageal cancer Neg Hx   .  Gallbladder disease Neg Hx   . Stroke Maternal Aunt     Social History   Social History  . Marital Status: Single    Spouse Name: N/A  . Number of Children: 1  . Years of Education: N/A   Occupational History  . Retired Therapist, sports     Social History Main Topics  . Smoking status: Former Smoker -- 2.00 packs/day for 30 years    Types: Cigarettes    Quit date: 08/18/1983  . Smokeless tobacco: Never Used  . Alcohol Use: No  . Drug Use: No  . Sexual Activity: Not on file   Other Topics Concern  . Not on file   Social History Narrative      Review of systems: Review of Systems  Constitutional: Negative for fever and chills.  HENT: Negative.   Eyes: Negative for blurred vision.  Respiratory: Negative for cough, shortness of breath and wheezing.     Cardiovascular: Negative for chest pain and palpitations.  Gastrointestinal: as per HPI Genitourinary: Negative for dysuria, urgency, frequency and hematuria.  Musculoskeletal: Negative for myalgias, back pain and joint pain.  Skin: Negative for itching and rash.  Neurological: Negative for dizziness, tremors, focal weakness, seizures and loss of consciousness.  Endo/Heme/Allergies: Negative for environmental allergies.  Psychiatric/Behavioral: Negative for depression, suicidal ideas and hallucinations.  All other systems reviewed and are negative.   Physical Exam: Filed Vitals:   11/19/15 1025  BP: 116/68  Pulse: 72   Gen:      No acute distress HEENT:  EOMI, sclera anicteric Neck:     No masses; no thyromegaly Lungs:    Clear to auscultation bilaterally; normal respiratory effort CV:         Regular rate and rhythm; no murmurs Abd:      + bowel sounds; soft, non-tender; no palpable masses, no distension Ext:    No edema; adequate peripheral perfusion Skin:      Warm and dry; no rash Neuro: alert and oriented x 3 Psych: normal mood and affect  Data Reviewed: MRI abdomen 08/09/15 Mildly nodular hepatic contour, corresponding to known cirrhosis.  No findings to suggest hepatocellular carcinoma.  Status post cholecystectomy.   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 Overall doing well f/u CBC,  BMP,  LFT,  PT/INR  No evidence of varices on EGD in 2016,  Due for screening for varices in 2018 Due for [facility] screening in Dec 2017 Continue low-dose spironolactone 25 mg daily   No evidence of hepatic encephalopathy Return in 6 months

## 2015-12-03 DIAGNOSIS — G2581 Restless legs syndrome: Secondary | ICD-10-CM | POA: Diagnosis not present

## 2015-12-03 DIAGNOSIS — J4520 Mild intermittent asthma, uncomplicated: Secondary | ICD-10-CM | POA: Diagnosis not present

## 2015-12-03 DIAGNOSIS — R05 Cough: Secondary | ICD-10-CM | POA: Diagnosis not present

## 2015-12-03 DIAGNOSIS — J84112 Idiopathic pulmonary fibrosis: Secondary | ICD-10-CM | POA: Diagnosis not present

## 2015-12-05 DIAGNOSIS — Z1231 Encounter for screening mammogram for malignant neoplasm of breast: Secondary | ICD-10-CM | POA: Diagnosis not present

## 2015-12-18 DIAGNOSIS — G4733 Obstructive sleep apnea (adult) (pediatric): Secondary | ICD-10-CM | POA: Diagnosis not present

## 2015-12-24 DIAGNOSIS — R05 Cough: Secondary | ICD-10-CM | POA: Diagnosis not present

## 2016-01-03 DIAGNOSIS — G4733 Obstructive sleep apnea (adult) (pediatric): Secondary | ICD-10-CM | POA: Diagnosis not present

## 2016-01-15 DIAGNOSIS — S42291A Other displaced fracture of upper end of right humerus, initial encounter for closed fracture: Secondary | ICD-10-CM | POA: Diagnosis not present

## 2016-01-15 DIAGNOSIS — S42201A Unspecified fracture of upper end of right humerus, initial encounter for closed fracture: Secondary | ICD-10-CM | POA: Diagnosis not present

## 2016-01-17 DIAGNOSIS — S42201A Unspecified fracture of upper end of right humerus, initial encounter for closed fracture: Secondary | ICD-10-CM | POA: Diagnosis not present

## 2016-01-22 DIAGNOSIS — G2581 Restless legs syndrome: Secondary | ICD-10-CM | POA: Diagnosis not present

## 2016-01-22 DIAGNOSIS — J84112 Idiopathic pulmonary fibrosis: Secondary | ICD-10-CM | POA: Diagnosis not present

## 2016-01-22 DIAGNOSIS — R05 Cough: Secondary | ICD-10-CM | POA: Diagnosis not present

## 2016-01-22 DIAGNOSIS — J4520 Mild intermittent asthma, uncomplicated: Secondary | ICD-10-CM | POA: Diagnosis not present

## 2016-01-26 DIAGNOSIS — G4733 Obstructive sleep apnea (adult) (pediatric): Secondary | ICD-10-CM | POA: Diagnosis not present

## 2016-01-26 DIAGNOSIS — J4520 Mild intermittent asthma, uncomplicated: Secondary | ICD-10-CM | POA: Diagnosis not present

## 2016-01-28 DIAGNOSIS — R05 Cough: Secondary | ICD-10-CM | POA: Diagnosis not present

## 2016-02-05 DIAGNOSIS — S42201A Unspecified fracture of upper end of right humerus, initial encounter for closed fracture: Secondary | ICD-10-CM | POA: Diagnosis not present

## 2016-02-19 DIAGNOSIS — J84112 Idiopathic pulmonary fibrosis: Secondary | ICD-10-CM | POA: Diagnosis not present

## 2016-02-19 DIAGNOSIS — G2581 Restless legs syndrome: Secondary | ICD-10-CM | POA: Diagnosis not present

## 2016-02-19 DIAGNOSIS — J4520 Mild intermittent asthma, uncomplicated: Secondary | ICD-10-CM | POA: Diagnosis not present

## 2016-02-19 DIAGNOSIS — R05 Cough: Secondary | ICD-10-CM | POA: Diagnosis not present

## 2016-02-19 DIAGNOSIS — R5383 Other fatigue: Secondary | ICD-10-CM | POA: Diagnosis not present

## 2016-02-26 DIAGNOSIS — S42201A Unspecified fracture of upper end of right humerus, initial encounter for closed fracture: Secondary | ICD-10-CM | POA: Diagnosis not present

## 2016-03-02 DIAGNOSIS — M25511 Pain in right shoulder: Secondary | ICD-10-CM | POA: Diagnosis not present

## 2016-03-02 DIAGNOSIS — M25611 Stiffness of right shoulder, not elsewhere classified: Secondary | ICD-10-CM | POA: Diagnosis not present

## 2016-03-03 ENCOUNTER — Encounter: Payer: Self-pay | Admitting: Gastroenterology

## 2016-03-05 DIAGNOSIS — M25611 Stiffness of right shoulder, not elsewhere classified: Secondary | ICD-10-CM | POA: Diagnosis not present

## 2016-03-05 DIAGNOSIS — M25511 Pain in right shoulder: Secondary | ICD-10-CM | POA: Diagnosis not present

## 2016-03-11 DIAGNOSIS — M25511 Pain in right shoulder: Secondary | ICD-10-CM | POA: Diagnosis not present

## 2016-03-11 DIAGNOSIS — M25611 Stiffness of right shoulder, not elsewhere classified: Secondary | ICD-10-CM | POA: Diagnosis not present

## 2016-03-12 DIAGNOSIS — J84112 Idiopathic pulmonary fibrosis: Secondary | ICD-10-CM | POA: Diagnosis not present

## 2016-03-16 DIAGNOSIS — M25611 Stiffness of right shoulder, not elsewhere classified: Secondary | ICD-10-CM | POA: Diagnosis not present

## 2016-03-16 DIAGNOSIS — M25511 Pain in right shoulder: Secondary | ICD-10-CM | POA: Diagnosis not present

## 2016-03-18 DIAGNOSIS — M25611 Stiffness of right shoulder, not elsewhere classified: Secondary | ICD-10-CM | POA: Diagnosis not present

## 2016-03-18 DIAGNOSIS — M25511 Pain in right shoulder: Secondary | ICD-10-CM | POA: Diagnosis not present

## 2016-03-24 DIAGNOSIS — M25511 Pain in right shoulder: Secondary | ICD-10-CM | POA: Diagnosis not present

## 2016-03-24 DIAGNOSIS — M25611 Stiffness of right shoulder, not elsewhere classified: Secondary | ICD-10-CM | POA: Diagnosis not present

## 2016-03-31 DIAGNOSIS — S42201A Unspecified fracture of upper end of right humerus, initial encounter for closed fracture: Secondary | ICD-10-CM | POA: Diagnosis not present

## 2016-04-01 DIAGNOSIS — M25611 Stiffness of right shoulder, not elsewhere classified: Secondary | ICD-10-CM | POA: Diagnosis not present

## 2016-04-01 DIAGNOSIS — M25511 Pain in right shoulder: Secondary | ICD-10-CM | POA: Diagnosis not present

## 2016-04-07 DIAGNOSIS — M25611 Stiffness of right shoulder, not elsewhere classified: Secondary | ICD-10-CM | POA: Diagnosis not present

## 2016-04-07 DIAGNOSIS — M25511 Pain in right shoulder: Secondary | ICD-10-CM | POA: Diagnosis not present

## 2016-04-16 DIAGNOSIS — M25511 Pain in right shoulder: Secondary | ICD-10-CM | POA: Diagnosis not present

## 2016-04-16 DIAGNOSIS — M25611 Stiffness of right shoulder, not elsewhere classified: Secondary | ICD-10-CM | POA: Diagnosis not present

## 2016-04-30 DIAGNOSIS — J84112 Idiopathic pulmonary fibrosis: Secondary | ICD-10-CM | POA: Diagnosis not present

## 2016-05-13 DIAGNOSIS — R5383 Other fatigue: Secondary | ICD-10-CM | POA: Diagnosis not present

## 2016-05-13 DIAGNOSIS — S42201A Unspecified fracture of upper end of right humerus, initial encounter for closed fracture: Secondary | ICD-10-CM | POA: Diagnosis not present

## 2016-05-19 DIAGNOSIS — R05 Cough: Secondary | ICD-10-CM | POA: Diagnosis not present

## 2016-05-20 DIAGNOSIS — N764 Abscess of vulva: Secondary | ICD-10-CM | POA: Diagnosis not present

## 2016-05-22 DIAGNOSIS — N764 Abscess of vulva: Secondary | ICD-10-CM | POA: Diagnosis not present

## 2016-05-25 DIAGNOSIS — N764 Abscess of vulva: Secondary | ICD-10-CM | POA: Diagnosis not present

## 2016-05-26 DIAGNOSIS — J84112 Idiopathic pulmonary fibrosis: Secondary | ICD-10-CM | POA: Diagnosis not present

## 2016-05-26 DIAGNOSIS — R05 Cough: Secondary | ICD-10-CM | POA: Diagnosis not present

## 2016-05-26 DIAGNOSIS — Z23 Encounter for immunization: Secondary | ICD-10-CM | POA: Diagnosis not present

## 2016-05-26 DIAGNOSIS — J4520 Mild intermittent asthma, uncomplicated: Secondary | ICD-10-CM | POA: Diagnosis not present

## 2016-05-26 DIAGNOSIS — G2581 Restless legs syndrome: Secondary | ICD-10-CM | POA: Diagnosis not present

## 2016-06-01 DIAGNOSIS — N764 Abscess of vulva: Secondary | ICD-10-CM | POA: Diagnosis not present

## 2016-06-16 ENCOUNTER — Ambulatory Visit (INDEPENDENT_AMBULATORY_CARE_PROVIDER_SITE_OTHER): Payer: Medicare Other | Admitting: Gastroenterology

## 2016-06-16 ENCOUNTER — Encounter: Payer: Self-pay | Admitting: Gastroenterology

## 2016-06-16 ENCOUNTER — Other Ambulatory Visit (INDEPENDENT_AMBULATORY_CARE_PROVIDER_SITE_OTHER): Payer: Medicare Other

## 2016-06-16 VITALS — BP 110/62 | HR 80 | Ht 62.75 in | Wt 181.2 lb

## 2016-06-16 DIAGNOSIS — R0789 Other chest pain: Secondary | ICD-10-CM

## 2016-06-16 DIAGNOSIS — K745 Biliary cirrhosis, unspecified: Secondary | ICD-10-CM

## 2016-06-16 DIAGNOSIS — R1010 Upper abdominal pain, unspecified: Secondary | ICD-10-CM | POA: Diagnosis not present

## 2016-06-16 DIAGNOSIS — R978 Other abnormal tumor markers: Secondary | ICD-10-CM | POA: Diagnosis not present

## 2016-06-16 LAB — COMPREHENSIVE METABOLIC PANEL
ALT: 31 U/L (ref 0–35)
AST: 42 U/L — ABNORMAL HIGH (ref 0–37)
Albumin: 4 g/dL (ref 3.5–5.2)
Alkaline Phosphatase: 252 U/L — ABNORMAL HIGH (ref 39–117)
BUN: 17 mg/dL (ref 6–23)
CHLORIDE: 102 meq/L (ref 96–112)
CO2: 30 meq/L (ref 19–32)
Calcium: 9.8 mg/dL (ref 8.4–10.5)
Creatinine, Ser: 1.04 mg/dL (ref 0.40–1.20)
GFR: 55.01 mL/min — ABNORMAL LOW (ref >60.00)
GLUCOSE: 92 mg/dL (ref 70–99)
POTASSIUM: 4.1 meq/L (ref 3.5–5.1)
SODIUM: 139 meq/L (ref 135–145)
Total Bilirubin: 0.4 mg/dL (ref 0.2–1.2)
Total Protein: 7.8 g/dL (ref 6.0–8.3)

## 2016-06-16 LAB — CBC WITH DIFFERENTIAL/PLATELET
BASOS PCT: 0.5 % (ref 0.0–3.0)
Basophils Absolute: 0 10*3/uL (ref 0.0–0.1)
EOS PCT: 7.9 % — AB (ref 0.0–5.0)
Eosinophils Absolute: 0.4 10*3/uL (ref 0.0–0.7)
HCT: 36.9 % (ref 36.0–46.0)
Hemoglobin: 12.4 g/dL (ref 12.0–15.0)
LYMPHS ABS: 1.2 10*3/uL (ref 0.7–4.0)
Lymphocytes Relative: 21.3 % (ref 12.0–46.0)
MCHC: 33.6 g/dL (ref 30.0–36.0)
MCV: 81.2 fl (ref 78.0–100.0)
MONO ABS: 0.4 10*3/uL (ref 0.1–1.0)
Monocytes Relative: 7.2 % (ref 3.0–12.0)
NEUTROS ABS: 3.5 10*3/uL (ref 1.4–7.7)
NEUTROS PCT: 63.1 % (ref 43.0–77.0)
PLATELETS: 268 10*3/uL (ref 150.0–400.0)
RBC: 4.55 Mil/uL (ref 3.87–5.11)
RDW: 16 % — AB (ref 11.5–15.5)
WBC: 5.6 10*3/uL (ref 4.0–10.5)

## 2016-06-16 LAB — PROTIME-INR
INR: 1.1 ratio — ABNORMAL HIGH (ref 0.8–1.0)
Prothrombin Time: 11.2 s (ref 9.6–13.1)

## 2016-06-16 MED ORDER — SPIRONOLACTONE 25 MG PO TABS: 25.0000 mg | ORAL_TABLET | Freq: Every day | ORAL | 3 refills | Status: DC

## 2016-06-16 NOTE — Patient Instructions (Signed)
Go to the basement for labs today   You have been scheduled for an abdominal ultrasound at [facility] (1st floor of hospital) on 06/19/2016 at 11am. Please arrive 15 minutes prior to your appointment for registration. Make certain not to have anything to eat or drink after midnight prior to your appointment. Should you need to reschedule your appointment, please contact radiology at [phone]. This test typically takes about 30 minutes to perform.  Get with your primary care doctor regarding your work up for chest pain so your PCP can refer you to a cardiologist   Restart Aldactone 25 mg daily   We will need to recheck you labs (BMET) in 2 weeks, orders will be in just come to our lab around 06/30/2016 to have drawn

## 2016-06-16 NOTE — Progress Notes (Signed)
Sherry Wang    [id number]    [birth year]  Primary Care Physician:PENNER,PAMELA, MD  Referring Physician: Greig Right, MD [address] 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 She is doing well overall , but patient reports that she has noticed some ankle edema periorbital swelling in the past few weeks , she has stopped taking Aldactone about 3 or 4 months ago for unclear reasons. She said she ran out of refills and did not think she needed to call in for additional refills. No dyspnea or orthopnea.  Denies any nausea, vomiting, abdominal pain, melena or bright red blood per rectum. Denies any confusion or tremors. On review of systems patient complains of epigastric pain and heaviness in the chest intermittently, sometimes worse with exertion but not every time. She cannot identify any exacerbating or relieving factors No other complaints during this visit    Outpatient Encounter Prescriptions as of 06/16/2016  Medication Sig  . amitriptyline (ELAVIL) 10 MG tablet Take 20 mg by mouth at bedtime.  . calcium carbonate (TUMS - DOSED IN MG ELEMENTAL CALCIUM) 500 MG chewable tablet Chew 1 tablet by mouth daily.  . Cholecalciferol (VITAMIN D3) 2000 UNITS TABS Take 1 tablet by mouth daily.  Marland Kitchen DEXILANT 60 MG capsule Take 1 tablet by mouth daily.  . fluticasone (FLONASE) 50 MCG/ACT nasal spray Place 2 sprays into both nostrils daily.  Marland Kitchen ipratropium-albuterol (DUONEB) 0.5-2.5 (3) MG/3ML SOLN Take 3 mLs by nebulization every 6 (six) hours as needed.  . montelukast (SINGULAIR) 10 MG tablet Take 10 mg by mouth at bedtime.  . Multiple Vitamin (MULTIVITAMIN) capsule Take 1 capsule by mouth daily.  . sertraline (ZOLOFT) 100 MG tablet Take 150 mg by mouth daily. Pt takes 2 tablets, by mouth, every day  . zolpidem (AMBIEN) 5 MG tablet Take 5 mg by mouth at bedtime as needed for sleep.  .  [DISCONTINUED] ipratropium-albuterol (DUONEB) 0.5-2.5 (3) MG/3ML SOLN as needed.  . [DISCONTINUED] Iron Combinations (IRON COMPLEX PO) Take 65 mg by mouth daily. Pt takes 2 tablets, by mouth, every day  . spironolactone (ALDACTONE) 25 MG tablet Take 1 tablet (25 mg total) by mouth daily.  . [DISCONTINUED] aspirin 81 MG tablet Take 81 mg by mouth daily.  . [DISCONTINUED] Calcium-Magnesium-Vitamin D (CALCIUM 500 PO) Take 2 tablets by mouth daily.  . [DISCONTINUED] famotidine (PEPCID) 20 MG tablet One at bedtime  . [DISCONTINUED] pantoprazole (PROTONIX) 40 MG tablet TAKE ONE TABLET BY MOUTH ONCE DAILY. TAKE  30-60  MINUTES  BEFORE  FIRST  MEAL  OF  THE  DAY  . [DISCONTINUED] spironolactone (ALDACTONE) 25 MG tablet Take 1 tablet (25 mg total) by mouth daily. (Patient not taking: Reported on 06/16/2016)  . [DISCONTINUED] vitamin B-12 (CYANOCOBALAMIN) 50 MCG tablet Take 50 mcg by mouth daily. Pt takes 2 gummies a day   No facility-administered encounter medications on file as of 06/16/2016.     Allergies as of 06/16/2016 - Review Complete 06/16/2016  Allergen Reaction Noted  . Paxil [paroxetine hcl] Itching 08/06/2014  . Tamiflu [oseltamivir phosphate]  08/06/2014    Past Medical History:  Diagnosis Date  . Anemia   . Anemia, iron deficiency 11/28/2014  . Anxiety   . AVM (arteriovenous malformation) of small bowel, acquired ([place])   . Biliary cirrhosis ([place])   . Gallstones   . GERD (gastroesophageal reflux disease)   .  GI bleeding   . Hyperlipemia   . Pneumonia   . Primary biliary cirrhosis 11/28/2014    Past Surgical History:  Procedure Laterality Date  . CHOLECYSTECTOMY    . LAMINECTOMY    . SHOULDER SURGERY    . TONSILLECTOMY     as a child    Family History  Problem Relation Age of Onset  . Asthma Father   . Leukemia Father     died age 62  . Cervical cancer Paternal Grandmother   . Alzheimer's disease Mother   . Heart disease Maternal Uncle     x2  . Colon polyps  Paternal Aunt   . Stroke Maternal Aunt   . Colon cancer Neg Hx   . Esophageal cancer Neg Hx   . Gallbladder disease Neg Hx     Social History   Social History  . Marital status: Single    Spouse name: N/A  . Number of children: 1  . Years of education: N/A   Occupational History  . Retired Therapist, sports     Social History Main Topics  . Smoking status: Former Smoker    Packs/day: 2.00    Years: 30.00    Types: Cigarettes    Quit date: 08/18/1983  . Smokeless tobacco: Never Used  . Alcohol use No  . Drug use: No  . Sexual activity: Not on file   Other Topics Concern  . Not on file   Social History Narrative  . No narrative on file      Review of systems: Review of Systems  Constitutional: Negative for fever and chills.  HENT: Negative.   Eyes: Negative for blurred vision.  Respiratory: Negative for cough, shortness of breath and wheezing.   Cardiovascular: Negative for chest pain and palpitations.  Gastrointestinal: as per HPI Genitourinary: Negative for dysuria, urgency, frequency and hematuria.  Musculoskeletal: Positive for myalgias, back pain and joint pain.  Skin: Negative for itching and rash.  Neurological: Negative for dizziness, tremors, focal weakness, seizures and loss of consciousness.  Endo/Heme/Allergies: Positive for seasonal allergies.  Psychiatric/Behavioral: Negative for depression, suicidal ideas and hallucinations.  All other systems reviewed and are negative.   Physical Exam: Vitals:   06/16/16 0919  BP: 110/62  Pulse: 80   Body mass index is 32.35 kg/m. 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   f/u CBC,  BMP,  LFT,  PT/INR and AFP  No evidence of varices on EGD in 2016,  Due for screening for varices in 2018  Epigastric and atypical chest pain: Advised patient to follow up with PMD and cardiology if needed to evaluate for possible ischemic artery disease , and patient said she will discuss with her primary care physician regarding appropriate workup. If cardiac workup negative, can consider EGD to exclude  peptic ulcer disease  Due for annual [employer] screening , will schedule abdominal ultrasound  Lower extremity edema and mild volume overload: Restart spironolactone 25 mg daily  , recheck BMP in 1-2 weeks  No evidence of hepatic encephalopathy Return in 6 months  25 minutes was spent face-to-face with the patient. Greater than 50% of the time used for counseling as well as treatment plan and follow-up. She had multiple questions which were answered to her satisfaction  K. Denzil Magnuson , MD [phone] Mon-Fri 8a-5p [phone] after 5p, weekends, holidays  CC: Greig Right, MD

## 2016-06-17 LAB — AFP TUMOR MARKER: AFP TUMOR MARKER: 7 ng/mL — AB (ref <6.1)

## 2016-06-18 ENCOUNTER — Other Ambulatory Visit: Payer: Self-pay | Admitting: *Deleted

## 2016-06-18 DIAGNOSIS — K7469 Other cirrhosis of liver: Secondary | ICD-10-CM

## 2016-06-19 ENCOUNTER — Ambulatory Visit (HOSPITAL_COMMUNITY): Admission: RE | Admit: 2016-06-19 | Payer: Medicare Other | Source: Ambulatory Visit

## 2016-06-24 ENCOUNTER — Ambulatory Visit (HOSPITAL_COMMUNITY)
Admission: RE | Admit: 2016-06-24 | Discharge: 2016-06-24 | Disposition: A | Discharge status: Home / Self Care | Payer: Medicare Other | Source: Ambulatory Visit | Attending: Gastroenterology | Admitting: Gastroenterology

## 2016-06-24 DIAGNOSIS — R932 Abnormal findings on diagnostic imaging of liver and biliary tract: Secondary | ICD-10-CM | POA: Diagnosis not present

## 2016-06-24 DIAGNOSIS — R1010 Upper abdominal pain, unspecified: Secondary | ICD-10-CM | POA: Insufficient documentation

## 2016-06-24 DIAGNOSIS — R0789 Other chest pain: Secondary | ICD-10-CM | POA: Diagnosis not present

## 2016-06-24 DIAGNOSIS — Z9049 Acquired absence of other specified parts of digestive tract: Secondary | ICD-10-CM | POA: Diagnosis not present

## 2016-06-24 DIAGNOSIS — K745 Biliary cirrhosis, unspecified: Secondary | ICD-10-CM | POA: Diagnosis not present

## 2016-06-24 DIAGNOSIS — K7460 Unspecified cirrhosis of liver: Secondary | ICD-10-CM | POA: Diagnosis not present

## 2016-07-07 DIAGNOSIS — J84112 Idiopathic pulmonary fibrosis: Secondary | ICD-10-CM | POA: Diagnosis not present

## 2016-07-07 DIAGNOSIS — J4521 Mild intermittent asthma with (acute) exacerbation: Secondary | ICD-10-CM | POA: Diagnosis not present

## 2016-07-07 DIAGNOSIS — R5383 Other fatigue: Secondary | ICD-10-CM | POA: Diagnosis not present

## 2016-07-16 DIAGNOSIS — E538 Deficiency of other specified B group vitamins: Secondary | ICD-10-CM | POA: Diagnosis not present

## 2016-07-16 DIAGNOSIS — K219 Gastro-esophageal reflux disease without esophagitis: Secondary | ICD-10-CM | POA: Diagnosis not present

## 2016-07-16 DIAGNOSIS — J84113 Idiopathic non-specific interstitial pneumonitis: Secondary | ICD-10-CM | POA: Diagnosis not present

## 2016-07-16 DIAGNOSIS — E7800 Pure hypercholesterolemia, unspecified: Secondary | ICD-10-CM | POA: Diagnosis not present

## 2016-07-16 DIAGNOSIS — E559 Vitamin D deficiency, unspecified: Secondary | ICD-10-CM | POA: Diagnosis not present

## 2016-07-16 DIAGNOSIS — M810 Age-related osteoporosis without current pathological fracture: Secondary | ICD-10-CM | POA: Diagnosis not present

## 2016-07-16 DIAGNOSIS — Z6831 Body mass index (BMI) 31.0-31.9, adult: Secondary | ICD-10-CM | POA: Diagnosis not present

## 2016-07-16 DIAGNOSIS — Z0000 Encounter for general adult medical examination without abnormal findings: Secondary | ICD-10-CM | POA: Diagnosis not present

## 2016-07-16 DIAGNOSIS — E669 Obesity, unspecified: Secondary | ICD-10-CM | POA: Diagnosis not present

## 2016-07-16 DIAGNOSIS — F320 Major depressive disorder, single episode, mild: Secondary | ICD-10-CM | POA: Diagnosis not present

## 2016-08-08 DIAGNOSIS — R0789 Other chest pain: Secondary | ICD-10-CM | POA: Diagnosis not present

## 2016-08-08 DIAGNOSIS — R079 Chest pain, unspecified: Secondary | ICD-10-CM | POA: Diagnosis not present

## 2016-08-08 DIAGNOSIS — R072 Precordial pain: Secondary | ICD-10-CM | POA: Diagnosis not present

## 2016-08-08 DIAGNOSIS — J8410 Pulmonary fibrosis, unspecified: Secondary | ICD-10-CM | POA: Diagnosis not present

## 2016-09-14 DIAGNOSIS — R5383 Other fatigue: Secondary | ICD-10-CM | POA: Diagnosis not present

## 2016-09-14 DIAGNOSIS — G2581 Restless legs syndrome: Secondary | ICD-10-CM | POA: Diagnosis not present

## 2016-09-14 DIAGNOSIS — J84112 Idiopathic pulmonary fibrosis: Secondary | ICD-10-CM | POA: Diagnosis not present

## 2016-09-14 DIAGNOSIS — J4520 Mild intermittent asthma, uncomplicated: Secondary | ICD-10-CM | POA: Diagnosis not present

## 2016-09-17 ENCOUNTER — Telehealth: Payer: Self-pay | Admitting: *Deleted

## 2016-09-17 NOTE — Telephone Encounter (Signed)
Called pt to inform labs (BMET) need to be drawn she never came in for her two week lab draw.  I put in order for BMET  Patient will come to the lab tomorrow because she is leaving town on Sunday

## 2016-09-24 ENCOUNTER — Other Ambulatory Visit: Payer: Self-pay | Admitting: *Deleted

## 2016-09-24 ENCOUNTER — Other Ambulatory Visit (INDEPENDENT_AMBULATORY_CARE_PROVIDER_SITE_OTHER): Payer: Medicare Other

## 2016-09-24 DIAGNOSIS — K745 Biliary cirrhosis, unspecified: Secondary | ICD-10-CM

## 2016-09-24 LAB — BASIC METABOLIC PANEL
BUN: 15 mg/dL (ref 6–23)
CHLORIDE: 105 meq/L (ref 96–112)
CO2: 31 meq/L (ref 19–32)
Calcium: 9.9 mg/dL (ref 8.4–10.5)
Creatinine, Ser: 0.97 mg/dL (ref 0.40–1.20)
GFR: 59.58 mL/min — AB (ref >60.00)
GLUCOSE: 95 mg/dL (ref 70–99)
POTASSIUM: 4.5 meq/L (ref 3.5–5.1)
SODIUM: 139 meq/L (ref 135–145)

## 2016-10-22 DIAGNOSIS — R5383 Other fatigue: Secondary | ICD-10-CM | POA: Diagnosis not present

## 2016-10-22 DIAGNOSIS — R0600 Dyspnea, unspecified: Secondary | ICD-10-CM | POA: Diagnosis not present

## 2016-10-22 DIAGNOSIS — G4733 Obstructive sleep apnea (adult) (pediatric): Secondary | ICD-10-CM | POA: Diagnosis not present

## 2016-10-26 ENCOUNTER — Other Ambulatory Visit: Payer: Self-pay | Admitting: Gastroenterology

## 2016-11-09 ENCOUNTER — Other Ambulatory Visit (INDEPENDENT_AMBULATORY_CARE_PROVIDER_SITE_OTHER): Payer: Medicare Other

## 2016-11-09 ENCOUNTER — Encounter: Payer: Self-pay | Admitting: Gastroenterology

## 2016-11-09 ENCOUNTER — Ambulatory Visit (INDEPENDENT_AMBULATORY_CARE_PROVIDER_SITE_OTHER): Payer: Medicare Other | Admitting: Gastroenterology

## 2016-11-09 VITALS — BP 132/68 | Ht 63.0 in | Wt 177.0 lb

## 2016-11-09 DIAGNOSIS — K7460 Unspecified cirrhosis of liver: Secondary | ICD-10-CM | POA: Diagnosis not present

## 2016-11-09 DIAGNOSIS — K743 Primary biliary cirrhosis: Secondary | ICD-10-CM | POA: Diagnosis not present

## 2016-11-09 DIAGNOSIS — K219 Gastro-esophageal reflux disease without esophagitis: Secondary | ICD-10-CM | POA: Diagnosis not present

## 2016-11-09 LAB — CBC WITH DIFFERENTIAL/PLATELET
BASOS PCT: 0.8 % (ref 0.0–3.0)
Basophils Absolute: 0.1 10*3/uL (ref 0.0–0.1)
Eosinophils Absolute: 0.4 10*3/uL (ref 0.0–0.7)
Eosinophils Relative: 4.9 % (ref 0.0–5.0)
HEMATOCRIT: 35 % — AB (ref 36.0–46.0)
Hemoglobin: 11.5 g/dL — ABNORMAL LOW (ref 12.0–15.0)
LYMPHS PCT: 19.5 % (ref 12.0–46.0)
Lymphs Abs: 1.4 10*3/uL (ref 0.7–4.0)
MCHC: 32.9 g/dL (ref 30.0–36.0)
MCV: 80.8 fl (ref 78.0–100.0)
MONOS PCT: 6.4 % (ref 3.0–12.0)
Monocytes Absolute: 0.5 10*3/uL (ref 0.1–1.0)
NEUTROS ABS: 4.9 10*3/uL (ref 1.4–7.7)
Neutrophils Relative %: 68.4 % (ref 43.0–77.0)
PLATELETS: 252 10*3/uL (ref 150.0–400.0)
RBC: 4.33 Mil/uL (ref 3.87–5.11)
RDW: 15.9 % — AB (ref 11.5–15.5)
WBC: 7.2 10*3/uL (ref 4.0–10.5)

## 2016-11-09 LAB — HEPATIC FUNCTION PANEL
ALBUMIN: 3.6 g/dL (ref 3.5–5.2)
ALT: 22 U/L (ref 0–35)
AST: 30 U/L (ref 0–37)
Alkaline Phosphatase: 174 U/L — ABNORMAL HIGH (ref 39–117)
Bilirubin, Direct: 0.1 mg/dL (ref 0.0–0.3)
TOTAL PROTEIN: 7.3 g/dL (ref 6.0–8.3)
Total Bilirubin: 0.3 mg/dL (ref 0.2–1.2)

## 2016-11-09 LAB — PROTIME-INR
INR: 1.1 ratio — ABNORMAL HIGH (ref 0.8–1.0)
PROTHROMBIN TIME: 11.5 s (ref 9.6–13.1)

## 2016-11-09 MED ORDER — ESOMEPRAZOLE MAGNESIUM 40 MG PO PACK: 40.0000 mg | PACK | Freq: Every day | ORAL | 6 refills | Status: DC

## 2016-11-09 MED ORDER — RANITIDINE HCL 150 MG PO TABS: 150.0000 mg | ORAL_TABLET | Freq: Two times a day (BID) | ORAL | 6 refills | Status: DC

## 2016-11-09 NOTE — Patient Instructions (Addendum)
We have scheduled your Endoscopy at [facility] on 02/02/2017 at 8:30am  Separate instructions have been given  Go to the basement today for labs  We have sent in your prescriptions to your pharmacy  Please have the cardiologist fax over your office notes to [phone]

## 2016-11-09 NOTE — Progress Notes (Signed)
Sherry Wang    [id number]    08/22/1940  Primary Care Physician:PENNER,PAMELA, MD  Referring Physician: Greig Right, MD [address] [place], [place] [address]  Chief complaint:  Primary biliary cirrhosis, GERD  HPI: 76 yr female with history of primary biliary cirrhosis and idiopathic pulmonary fibrosis here for follow-up visit. She was last seen in the office in October 2017. She would like to switch to a different PPI as Dexilant is expensive with higher out-of-pocket cost. She hasn't noticed any difference in terms of her breakthrough heartburn with Dexilant. Denies any nausea, vomiting, abdominal pain, melena or bright red blood per rectum     Outpatient Encounter Prescriptions as of 11/09/2016  Medication Sig  . amitriptyline (ELAVIL) 10 MG tablet Take 20 mg by mouth at bedtime.  . calcium carbonate (TUMS - DOSED IN MG ELEMENTAL CALCIUM) 500 MG chewable tablet Chew 1 tablet by mouth daily.  . Cholecalciferol (VITAMIN D3) 2000 UNITS TABS Take 1 tablet by mouth daily.  . fluticasone (FLONASE) 50 MCG/ACT nasal spray Place 2 sprays into both nostrils daily.  Marland Kitchen ipratropium-albuterol (DUONEB) 0.5-2.5 (3) MG/3ML SOLN Take 3 mLs by nebulization every 6 (six) hours as needed.  . Multiple Vitamin (MULTIVITAMIN) capsule Take 1 capsule by mouth daily.  . sertraline (ZOLOFT) 100 MG tablet Take 150 mg by mouth daily. Pt takes 2 tablets, by mouth, every day  . spironolactone (ALDACTONE) 25 MG tablet TAKE ONE TABLET BY MOUTH ONCE DAILY  . zolpidem (AMBIEN) 5 MG tablet Take 5 mg by mouth at bedtime as needed for sleep.  . [DISCONTINUED] DEXILANT 60 MG capsule Take 1 tablet by mouth daily.  . [DISCONTINUED] montelukast (SINGULAIR) 10 MG tablet Take 10 mg by mouth at bedtime.   No facility-administered encounter medications on file as of 11/09/2016.     Allergies as of 11/09/2016 - Review Complete 11/09/2016  Allergen Reaction Noted  . Paxil [paroxetine hcl]  Itching 08/06/2014  . Tamiflu [oseltamivir phosphate]  08/06/2014    Past Medical History:  Diagnosis Date  . Anemia   . Anemia, iron deficiency 11/28/2014  . Anxiety   . Asthma   . AVM (arteriovenous malformation) of small bowel, acquired ([place])   . Biliary cirrhosis ([place])   . Gallstones   . GERD (gastroesophageal reflux disease)   . GI bleeding   . Hyperlipemia   . Pneumonia   . Primary biliary cirrhosis 11/28/2014  . Pulmonary fibrosis ([place])     Past Surgical History:  Procedure Laterality Date  . CHOLECYSTECTOMY    . LAMINECTOMY    . SHOULDER SURGERY    . TONSILLECTOMY     as a child    Family History  Problem Relation Age of Onset  . Asthma Father   . Leukemia Father     died age 8  . Cervical cancer Paternal Grandmother   . Alzheimer's disease Mother   . Heart disease Maternal Uncle     x2  . Colon polyps Paternal Aunt   . Stroke Maternal Aunt   . Colon cancer Neg Hx   . Esophageal cancer Neg Hx   . Gallbladder disease Neg Hx     Social History   Social History  . Marital status: Single    Spouse name: N/A  . Number of children: 1  . Years of education: N/A   Occupational History  . Retired Therapist, sports     Social History Main Topics  .  Smoking status: Former Smoker    Packs/day: 2.00    Years: 30.00    Types: Cigarettes    Quit date: 08/18/1983  . Smokeless tobacco: Never Used  . Alcohol use No  . Drug use: No  . Sexual activity: Not on file   Other Topics Concern  . Not on file   Social History Narrative  . No narrative on file      Review of systems: Review of Systems  Constitutional: Negative for fever and chills.  positive for lack of energy HENT: Positive for sinus problems, postnasal drip and runny nose  Eyes: Negative for blurred vision.  Respiratory: Positive for cough, shortness of breath and wheezing.   on home oxygen Cardiovascular: Negative for chest pain and palpitations.  Gastrointestinal: as per HPI Genitourinary: Negative  for dysuria, urgency, frequency and hematuria.  Musculoskeletal: Positive for myalgias, back pain and joint pain.  Skin: Negative for itching and rash.  Neurological: Negative for dizziness, tremors, focal weakness, seizures and loss of consciousness.  Endo/Heme/Allergies: Positive for seasonal allergies.  Psychiatric/Behavioral: Negative for depression, suicidal ideas and hallucinations.  positive for anxiety All other systems reviewed and are negative.   Physical Exam: Vitals:   11/09/16 1503  BP: 132/68   Body mass index is 31.35 kg/m. Gen:      No acute distress HEENT:  EOMI, sclera anicteric Neck:     No masses; no thyromegaly Lungs:    Crackles bilaterally; normal respiratory effort CV:         Regular rate and rhythm; no murmurs Abd:      + bowel sounds; soft, non-tender; no palpable masses, no distension Ext:    No edema; adequate peripheral perfusion Skin:      Warm and dry; no rash Neuro: alert and oriented x 3 Psych: normal mood and affect  Data Reviewed:  Reviewed labs, radiology imaging, old records and pertinent past GI work up   Assessment and Plan/Recommendations:  76 year old female with history of primary biliary cirrhosis here for follow-up visit  GERD: We'll switch to Nexium 40 mg daily, 30 minutes before breakfast Ranitidine 150 mg at bedtime Discussed antireflux measures in detail  Esophageal varices screen: Due for EGD in April 2018 We will schedule at [facility]. 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 All questions answered. The patient agrees to proceed  Volume status stable Continue low dose Aldactone  HCC screening up to date Abdominal ultrasound Nov 2017 AFP 7.0  No evidence of hepatic encephalopathy  Return in 6 months or sooner if neeeded   Damaris Hippo , MD [phone] Mon-Fri 8a-5p [phone] after 5p, weekends,  holidays  CC: Greig Right, MD

## 2016-11-17 ENCOUNTER — Other Ambulatory Visit: Payer: Self-pay

## 2016-11-17 MED ORDER — ESOMEPRAZOLE MAGNESIUM 40 MG PO CPDR
40.0000 mg | DELAYED_RELEASE_CAPSULE | Freq: Every day | ORAL | 5 refills | Status: DC
Start: 2016-11-17 — End: 2018-03-01

## 2016-11-18 DIAGNOSIS — R05 Cough: Secondary | ICD-10-CM | POA: Diagnosis not present

## 2016-11-25 DIAGNOSIS — J84112 Idiopathic pulmonary fibrosis: Secondary | ICD-10-CM | POA: Diagnosis not present

## 2016-11-25 DIAGNOSIS — R5383 Other fatigue: Secondary | ICD-10-CM | POA: Diagnosis not present

## 2016-11-25 DIAGNOSIS — R05 Cough: Secondary | ICD-10-CM | POA: Diagnosis not present

## 2016-11-26 DIAGNOSIS — K743 Primary biliary cirrhosis: Secondary | ICD-10-CM | POA: Diagnosis not present

## 2016-11-26 DIAGNOSIS — R072 Precordial pain: Secondary | ICD-10-CM | POA: Insufficient documentation

## 2016-11-26 DIAGNOSIS — J8410 Pulmonary fibrosis, unspecified: Secondary | ICD-10-CM | POA: Diagnosis not present

## 2016-11-26 DIAGNOSIS — R079 Chest pain, unspecified: Secondary | ICD-10-CM | POA: Diagnosis not present

## 2016-12-15 DIAGNOSIS — R079 Chest pain, unspecified: Secondary | ICD-10-CM | POA: Diagnosis not present

## 2017-02-01 ENCOUNTER — Encounter (HOSPITAL_COMMUNITY): Payer: Self-pay | Admitting: *Deleted

## 2017-02-02 ENCOUNTER — Encounter (HOSPITAL_COMMUNITY): Payer: Self-pay | Admitting: *Deleted

## 2017-02-02 ENCOUNTER — Encounter (HOSPITAL_COMMUNITY)
Admission: RE | Disposition: A | Discharge status: Home / Self Care | Payer: Self-pay | Source: Ambulatory Visit | Attending: Gastroenterology

## 2017-02-02 ENCOUNTER — Ambulatory Visit (HOSPITAL_COMMUNITY)
Admission: RE | Admit: 2017-02-02 | Discharge: 2017-02-02 | Disposition: A | Discharge status: Home / Self Care | Payer: Medicare Other | Source: Ambulatory Visit | Attending: Gastroenterology | Admitting: Gastroenterology

## 2017-02-02 ENCOUNTER — Ambulatory Visit (HOSPITAL_COMMUNITY): Payer: Medicare Other | Admitting: Anesthesiology

## 2017-02-02 DIAGNOSIS — J8410 Pulmonary fibrosis, unspecified: Secondary | ICD-10-CM | POA: Diagnosis not present

## 2017-02-02 DIAGNOSIS — K3189 Other diseases of stomach and duodenum: Secondary | ICD-10-CM | POA: Diagnosis not present

## 2017-02-02 DIAGNOSIS — F329 Major depressive disorder, single episode, unspecified: Secondary | ICD-10-CM | POA: Insufficient documentation

## 2017-02-02 DIAGNOSIS — J449 Chronic obstructive pulmonary disease, unspecified: Secondary | ICD-10-CM | POA: Diagnosis not present

## 2017-02-02 DIAGNOSIS — Z79899 Other long term (current) drug therapy: Secondary | ICD-10-CM | POA: Insufficient documentation

## 2017-02-02 DIAGNOSIS — I8510 Secondary esophageal varices without bleeding: Secondary | ICD-10-CM | POA: Insufficient documentation

## 2017-02-02 DIAGNOSIS — K7460 Unspecified cirrhosis of liver: Secondary | ICD-10-CM | POA: Diagnosis not present

## 2017-02-02 DIAGNOSIS — Z9049 Acquired absence of other specified parts of digestive tract: Secondary | ICD-10-CM | POA: Diagnosis not present

## 2017-02-02 DIAGNOSIS — K219 Gastro-esophageal reflux disease without esophagitis: Secondary | ICD-10-CM | POA: Insufficient documentation

## 2017-02-02 DIAGNOSIS — Z9981 Dependence on supplemental oxygen: Secondary | ICD-10-CM | POA: Insufficient documentation

## 2017-02-02 DIAGNOSIS — K766 Portal hypertension: Secondary | ICD-10-CM | POA: Insufficient documentation

## 2017-02-02 DIAGNOSIS — F419 Anxiety disorder, unspecified: Secondary | ICD-10-CM | POA: Diagnosis not present

## 2017-02-02 DIAGNOSIS — Z7951 Long term (current) use of inhaled steroids: Secondary | ICD-10-CM | POA: Insufficient documentation

## 2017-02-02 DIAGNOSIS — Z87891 Personal history of nicotine dependence: Secondary | ICD-10-CM | POA: Insufficient documentation

## 2017-02-02 HISTORY — DX: Dependence on supplemental oxygen: Z99.81

## 2017-02-02 HISTORY — DX: Paresthesia of skin: R20.2

## 2017-02-02 HISTORY — DX: Headache: R51

## 2017-02-02 HISTORY — DX: Family history of other specified conditions: Z84.89

## 2017-02-02 HISTORY — DX: Major depressive disorder, single episode, unspecified: F32.9

## 2017-02-02 HISTORY — PX: ESOPHAGOGASTRODUODENOSCOPY (EGD) WITH PROPOFOL: SHX5813

## 2017-02-02 HISTORY — DX: Depression, unspecified: F32.A

## 2017-02-02 HISTORY — DX: Headache, unspecified: R51.9

## 2017-02-02 SURGERY — ESOPHAGOGASTRODUODENOSCOPY (EGD) WITH PROPOFOL
Anesthesia: Monitor Anesthesia Care

## 2017-02-02 MED ORDER — LACTATED RINGERS IV SOLN
INTRAVENOUS | Status: DC
  Administered 2017-02-02: 07:00:00 via INTRAVENOUS

## 2017-02-02 MED ORDER — SODIUM CHLORIDE 0.9 % IV SOLN: INTRAVENOUS | Status: DC

## 2017-02-02 MED ORDER — PHENYLEPHRINE 40 MCG/ML (10ML) SYRINGE FOR IV PUSH (FOR BLOOD PRESSURE SUPPORT)
PREFILLED_SYRINGE | INTRAVENOUS | Status: AC
  Filled 2017-02-02: qty 10

## 2017-02-02 MED ORDER — PROPOFOL 10 MG/ML IV BOLUS
INTRAVENOUS | Status: DC | PRN
  Administered 2017-02-02 (×2): 20 mg via INTRAVENOUS
  Administered 2017-02-02: 40 mg via INTRAVENOUS

## 2017-02-02 MED ORDER — PROPOFOL 10 MG/ML IV BOLUS
INTRAVENOUS | Status: AC
  Filled 2017-02-02: qty 40

## 2017-02-02 MED ORDER — PROPOFOL 500 MG/50ML IV EMUL
INTRAVENOUS | Status: DC | PRN
  Administered 2017-02-02: 100 ug/kg/min via INTRAVENOUS

## 2017-02-02 SURGICAL SUPPLY — 14 items

## 2017-02-02 NOTE — Anesthesia Preprocedure Evaluation (Signed)
Anesthesia Evaluation  Patient identified by MRN, date of birth, ID band Patient awake    Reviewed: Allergy & Precautions, NPO status , Patient's Chart, lab work & pertinent test results  Airway Mallampati: II  TM Distance: >3 FB Neck ROM: Full    Dental no notable dental hx.    Pulmonary COPD,  oxygen dependent, former smoker,    Pulmonary exam normal breath sounds clear to auscultation       Cardiovascular negative cardio ROS Normal cardiovascular exam Rhythm:Regular Rate:Normal     Neuro/Psych negative neurological ROS  negative psych ROS   GI/Hepatic Neg liver ROS, GERD  ,  Endo/Other  negative endocrine ROS  Renal/GU negative Renal ROS  negative genitourinary   Musculoskeletal negative musculoskeletal ROS (+)   Abdominal   Peds negative pediatric ROS (+)  Hematology negative hematology ROS (+)   Anesthesia Other Findings   Reproductive/Obstetrics negative OB ROS                             Anesthesia Physical Anesthesia Plan  ASA: III  Anesthesia Plan: MAC   Post-op Pain Management:    Induction: Intravenous  PONV Risk Score and Plan: 0  Airway Management Planned: Nasal Cannula  Additional Equipment:   Intra-op Plan:   Post-operative Plan:   Informed Consent: I have reviewed the patients History and Physical, chart, labs and discussed the procedure including the risks, benefits and alternatives for the proposed anesthesia with the patient or authorized representative who has indicated his/her understanding and acceptance.   Dental advisory given  Plan Discussed with: CRNA and Surgeon  Anesthesia Plan Comments:         Anesthesia Quick Evaluation

## 2017-02-02 NOTE — Op Note (Signed)
[facility] Patient Name: Sherry Wang Procedure Date: 02/02/2017 MRN: [id number] Attending MD: Mauri Pole , MD Date of Birth: 1941/07/19 CSN: [id number] Age: 76 Admit Type: Outpatient Procedure:                Upper GI endoscopy Indications:              To evaluate esophageal varices in patient with                            suspected portal hypertension Providers:                Mauri Pole, MD, Lillie Fragmin, RN,                            Kingsley Plan, RN, Tinnie Gens, Technician Referring MD:              Medicines:                Monitored Anesthesia Care Complications:            No immediate complications. Estimated Blood Loss:     Estimated blood loss: none. Procedure:                Pre-Anesthesia Assessment:                           - Prior to the procedure, a History and Physical                            was performed, and patient medications and                            allergies were reviewed. The patient's tolerance of                            previous anesthesia was also reviewed. The risks                            and benefits of the procedure and the sedation                            options and risks were discussed with the patient.                            All questions were answered, and informed consent                            was obtained. Prior Anticoagulants: The patient has                            taken no previous anticoagulant or antiplatelet                            agents. ASA Grade Assessment: III - A patient with  severe systemic disease. After reviewing the risks                            and benefits, the patient was deemed in                            satisfactory condition to undergo the procedure.                           After obtaining informed consent, the endoscope was                            passed under direct vision. Throughout the           procedure, the patient's blood pressure, pulse, and                            oxygen saturations were monitored continuously. The                            EG-[id number] ([id number]) scope was introduced through the                            mouth, and advanced to the second part of duodenum.                            The upper GI endoscopy was accomplished without                            difficulty. The patient tolerated the procedure                            well. Scope In: Scope Out: Findings:      The esophagus was normal. No esophageal varices      Mild portal hypertensive gastropathy was found in the entire examined       stomach. No gastric varices      The examined duodenum was normal. Impression:               - Normal esophagus.                           - Portal hypertensive gastropathy.                           - Normal examined duodenum.                           - No specimens collected. Moderate Sedation:      N/A- Per Anesthesia Care Recommendation:           - Patient has a contact number available for                            emergencies. The signs and symptoms of potential  delayed complications were discussed with the                            patient. Return to normal activities tomorrow.                            Written discharge instructions were provided to the                            patient.                           - Resume previous diet.                           - Continue present medications.                           - No aspirin, ibuprofen, naproxen, or other                            non-steroidal anti-inflammatory drugs.                           - Repeat upper endoscopy in 2 years for varices                            screening purposes.                           - Return to GI clinic in 6 months. Procedure Code(s):        --- Professional ---                           [phone],  Esophagogastroduodenoscopy, flexible,                            transoral; diagnostic, including collection of                            specimen(s) by brushing or washing, when performed                            (separate procedure) Diagnosis Code(s):        --- Professional ---                           K76.6, Portal hypertension                           K31.89, Other diseases of stomach and duodenum                           I85.00, Esophageal varices without bleeding CPT copyright 2016 American Medical Association. All rights reserved. The codes documented in this report are preliminary and upon coder review may  be revised to meet current compliance requirements. Mauri Pole, MD 02/02/2017 8:58:57  AM This report has been signed electronically. Number of Addenda: 0

## 2017-02-02 NOTE — Transfer of Care (Signed)
Immediate Anesthesia Transfer of Care Note  Patient: Sherry Wang  Procedure(s) Performed: Procedure(s): ESOPHAGOGASTRODUODENOSCOPY (EGD) WITH PROPOFOL (N/A)  Patient Location: PACU  Anesthesia Type:MAC  Level of Consciousness:  sedated, patient cooperative and responds to stimulation  Airway & Oxygen Therapy:Patient Spontanous Breathing and Patient connected to face mask oxgen  Post-op Assessment:  Report given to PACU RN and Post -op Vital signs reviewed and stable  Post vital signs:  Reviewed and stable  Last Vitals:  Vitals:   02/02/17 0714  BP: (!) 154/63  Pulse: (!) 59  Resp: 13  Temp: 44.6 C    Complications: No apparent anesthesia complications

## 2017-02-02 NOTE — H&P (Signed)
[facility] History and Physical   Primary Care Physician:  Greig Right, MD   Reason for Procedure:   Esophageal varices screening Plan:    EGD with possible banding of esophageal varices    HPI: Sherry Wang is a 76 y.o. female with cirrhosis here for EGD for varices screening. Denies any nausea, vomiting, abdominal pain, melena or bright red blood per rectum    Past Medical History:  Diagnosis Date  . Anemia   . Anemia, iron deficiency 11/28/2014  . Anxiety   . Asthma   . AVM (arteriovenous malformation) of small bowel, acquired ([place])   . Biliary cirrhosis ([place])   . Depression   . Family history of adverse reaction to anesthesia    sister had hysteria after anesthesia  . Gallstones   . GERD (gastroesophageal reflux disease)   . GI bleeding   . Headache   . History of home oxygen therapy    2 liters at hs only  . Hyperlipemia   . Pneumonia    last 2016  . 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 Pulmonary fibrosis ([place])   . Tingling    left foot sing back surgery     Past Surgical History:  Procedure Laterality Date  . CHOLECYSTECTOMY    . LAMINECTOMY  age 43  . SHOULDER SURGERY Left   . TONSILLECTOMY     as a child    Prior to Admission medications   Medication Sig Start Date End Date Taking? Authorizing Provider  amitriptyline (ELAVIL) 10 MG tablet Take 10-20 mg by mouth at bedtime.    Yes [provider]  budesonide-formoterol (SYMBICORT) 160-4.5 MCG/ACT inhaler Inhale 2 puffs into the lungs every evening.    Yes [provider]  calcium carbonate (TUMS - DOSED IN MG ELEMENTAL CALCIUM) 500 MG chewable tablet Chew 1 tablet by mouth daily.   Yes [provider]  Cholecalciferol (VITAMIN D3) 2000 UNITS TABS Take 1 tablet by mouth daily.   Yes [provider]  cyanocobalamin 1000 MCG tablet Take 1,000 mcg by mouth every evening.   Yes [provider]  esomeprazole (NEXIUM) 40 MG capsule Take 1  capsule (40 mg total) by mouth daily before breakfast. Patient taking differently: Take 40 mg by mouth every evening.  11/17/16  Yes Kaci Freel, Venia Minks, MD  fluticasone (FLONASE) 50 MCG/ACT nasal spray Place 2 sprays into both nostrils every evening.    Yes [provider]  ibuprofen (ADVIL,MOTRIN) 200 MG tablet Take 400 mg by mouth every 6 (six) hours as needed for mild pain or moderate pain.   Yes [provider]  ipratropium-albuterol (DUONEB) 0.5-2.5 (3) MG/3ML SOLN Take 3 mLs by nebulization every 6 (six) hours as needed.   Yes [provider]  montelukast (SINGULAIR) 10 MG tablet Take 10 mg by mouth daily.   Yes [provider]  Multiple Vitamin (MULTIVITAMIN) capsule Take 1 capsule by mouth daily before supper.    Yes [provider]  ranitidine (ZANTAC) 150 MG tablet Take 1 tablet (150 mg total) by mouth 2 (two) times daily. Patient taking differently: Take 150 mg by mouth every evening.  11/09/16  Yes Keandra Medero, Venia Minks, MD  sertraline (ZOLOFT) 100 MG tablet Take 200 mg by mouth at bedtime. Pt takes 2 tablets, by mouth, every day   Yes [provider]  spironolactone (ALDACTONE) 25 MG tablet TAKE ONE TABLET BY MOUTH ONCE DAILY Patient taking differently: TAKE ONE TABLET BY MOUTH ONCE DAILY IN THE  EVENING 10/26/16  Yes Daneesha Quinteros, Venia Minks, MD  zolpidem (AMBIEN) 5 MG tablet Take 5 mg by mouth at bedtime as needed for sleep.   Yes [provider]  nitroGLYCERIN (NITROSTAT) 0.4 MG SL tablet Place 0.4 mg under the tongue every 5 (five) minutes as needed for chest pain.  11/26/16 11/26/17  [provider]    Current Facility-Administered Medications  Medication Dose Route Frequency Provider Last Rate Last Dose  . lactated ringers infusion   Intravenous Continuous Mauri Pole, MD 20 mL/hr at 02/02/17 0718      Allergies as of 11/09/2016 - Review Complete 11/09/2016  Allergen Reaction Noted  . Paxil [paroxetine hcl]  Itching 08/06/2014  . Tamiflu [oseltamivir phosphate]  08/06/2014    Family History  Problem Relation Age of Onset  . Asthma Father   . Leukemia Father        died age 19  . Cervical cancer Paternal Grandmother   . Alzheimer's disease Mother   . Heart disease Maternal Uncle        x2  . Colon polyps Paternal Aunt   . Stroke Maternal Aunt   . Colon cancer Neg Hx   . Esophageal cancer Neg Hx   . Gallbladder disease Neg Hx     Social History   Social History  . Marital status: Single    Spouse name: N/A  . Number of children: 1  . Years of education: N/A   Occupational History  . Retired Therapist, sports     Social History Main Topics  . Smoking status: Former Smoker    Packs/day: 2.00    Years: 20.00    Types: Cigarettes    Quit date: 08/18/1983  . Smokeless tobacco: Never Used  . Alcohol use No  . Drug use: No  . Sexual activity: Not on file   Other Topics Concern  . Not on file   Social History Narrative  . No narrative on file    Review of Systems:  All other review of systems negative except as mentioned in the HPI.  Physical Exam: Vital signs in last 24 hours: Temp:  [97.7 F (36.5 C)] 97.7 F (36.5 C) (06/19 0714) Pulse Rate:  [59] 59 (06/19 0714) Resp:  [13] 13 (06/19 0714) BP: (154)/(63) 154/63 (06/19 0714) SpO2:  [99 %] 99 % (06/19 0714) Weight:  [170 lb (77.1 kg)] 170 lb (77.1 kg) (06/19 0714)   General:   Alert,  Well-developed, well-nourished, pleasant and cooperative in NAD Lungs:  Clear throughout to auscultation.   Heart:  Regular rate and rhythm; no murmurs, clicks, rubs,  or gallops. Abdomen:  Soft, nontender and nondistended. Normal bowel sounds.   Neuro/Psych:  Alert and cooperative. Normal mood and affect. A and O x 3   @K .Denzil Magnuson, MD [phone] Mon-Fri 8a-5p [phone] after 5p, weekends, holidays 02/02/2017 8:40 AM@

## 2017-02-02 NOTE — Discharge Instructions (Signed)
Esophagogastroduodenoscopy, Care After °Refer to this sheet in the next few weeks. These instructions provide you with information about caring for yourself after your procedure. Your health care provider may also give you more specific instructions. Your treatment has been planned according to current medical practices, but problems sometimes occur. Call your health care provider if you have any problems or questions after your procedure. °What can I expect after the procedure? °After the procedure, it is common to have: °· A sore throat. °· Nausea. °· Bloating. °· Dizziness. °· Fatigue. ° °Follow these instructions at home: °· Do not eat or drink anything until the numbing medicine (local anesthetic) has worn off and your gag reflex has returned. You will know that the local anesthetic has worn off when you can swallow comfortably. °· Do not drive for 24 hours if you received a medicine to help you relax (sedative). °· If your health care provider took a tissue sample for testing during the procedure, make sure to get your test results. This is your responsibility. Ask your health care provider or the department performing the test when your results will be ready. °· Keep all follow-up visits as told by your health care provider. This is important. °Contact a health care provider if: °· You cannot stop coughing. °· You are not urinating. °· You are urinating less than usual. °Get help right away if: °· You have trouble swallowing. °· You cannot eat or drink. °· You have throat or chest pain that gets worse. °· You are dizzy or light-headed. °· You faint. °· You have nausea or vomiting. °· You have chills. °· You have a fever. °· You have severe abdominal pain. °· You have black, tarry, or bloody stools. °This information is not intended to replace advice given to you by your health care provider. Make sure you discuss any questions you have with your health care provider. °Document Released: 07/20/2012 Document  Revised: 01/09/2016 Document Reviewed: 06/27/2015 °Elsevier Interactive Patient Education © 2018 Elsevier Inc. ° °

## 2017-02-02 NOTE — Anesthesia Postprocedure Evaluation (Signed)
Anesthesia Post Note  Patient: Sherry Wang  Procedure(s) Performed: Procedure(s) (LRB): ESOPHAGOGASTRODUODENOSCOPY (EGD) WITH PROPOFOL (N/A)     Patient location during evaluation: PACU Anesthesia Type: MAC Level of consciousness: awake and alert Pain management: pain level controlled Vital Signs Assessment: post-procedure vital signs reviewed and stable Respiratory status: spontaneous breathing, nonlabored ventilation, respiratory function stable and patient connected to nasal cannula oxygen Cardiovascular status: stable and blood pressure returned to baseline Anesthetic complications: no    Last Vitals:  Vitals:   02/02/17 0910 02/02/17 0935  BP: 122/66 (!) 134/50  Pulse: 61 65  Resp: 19 18  Temp:      Last Pain:  Vitals:   02/02/17 0900  TempSrc: Oral                 Rondle Lohse S

## 2017-02-03 ENCOUNTER — Encounter (HOSPITAL_COMMUNITY): Payer: Self-pay | Admitting: Gastroenterology

## 2017-02-05 DIAGNOSIS — Z1239 Encounter for other screening for malignant neoplasm of breast: Secondary | ICD-10-CM | POA: Diagnosis not present

## 2017-02-05 DIAGNOSIS — F5109 Other insomnia not due to a substance or known physiological condition: Secondary | ICD-10-CM | POA: Diagnosis not present

## 2017-02-05 DIAGNOSIS — F320 Major depressive disorder, single episode, mild: Secondary | ICD-10-CM | POA: Diagnosis not present

## 2017-02-05 DIAGNOSIS — Z79899 Other long term (current) drug therapy: Secondary | ICD-10-CM | POA: Diagnosis not present

## 2017-02-05 DIAGNOSIS — J84113 Idiopathic non-specific interstitial pneumonitis: Secondary | ICD-10-CM | POA: Diagnosis not present

## 2017-02-05 DIAGNOSIS — E7800 Pure hypercholesterolemia, unspecified: Secondary | ICD-10-CM | POA: Diagnosis not present

## 2017-02-16 IMAGING — CR DG CHEST 2V
2 series · 2 of 2 positions shown · non-contrast
Comparison: None.

CLINICAL DATA: Known pulmonary fibrosis, currently asymptomatic,
history of previous tobacco use.

EXAM:
CHEST  2 VIEW

[view not recorded (1 of 2)]
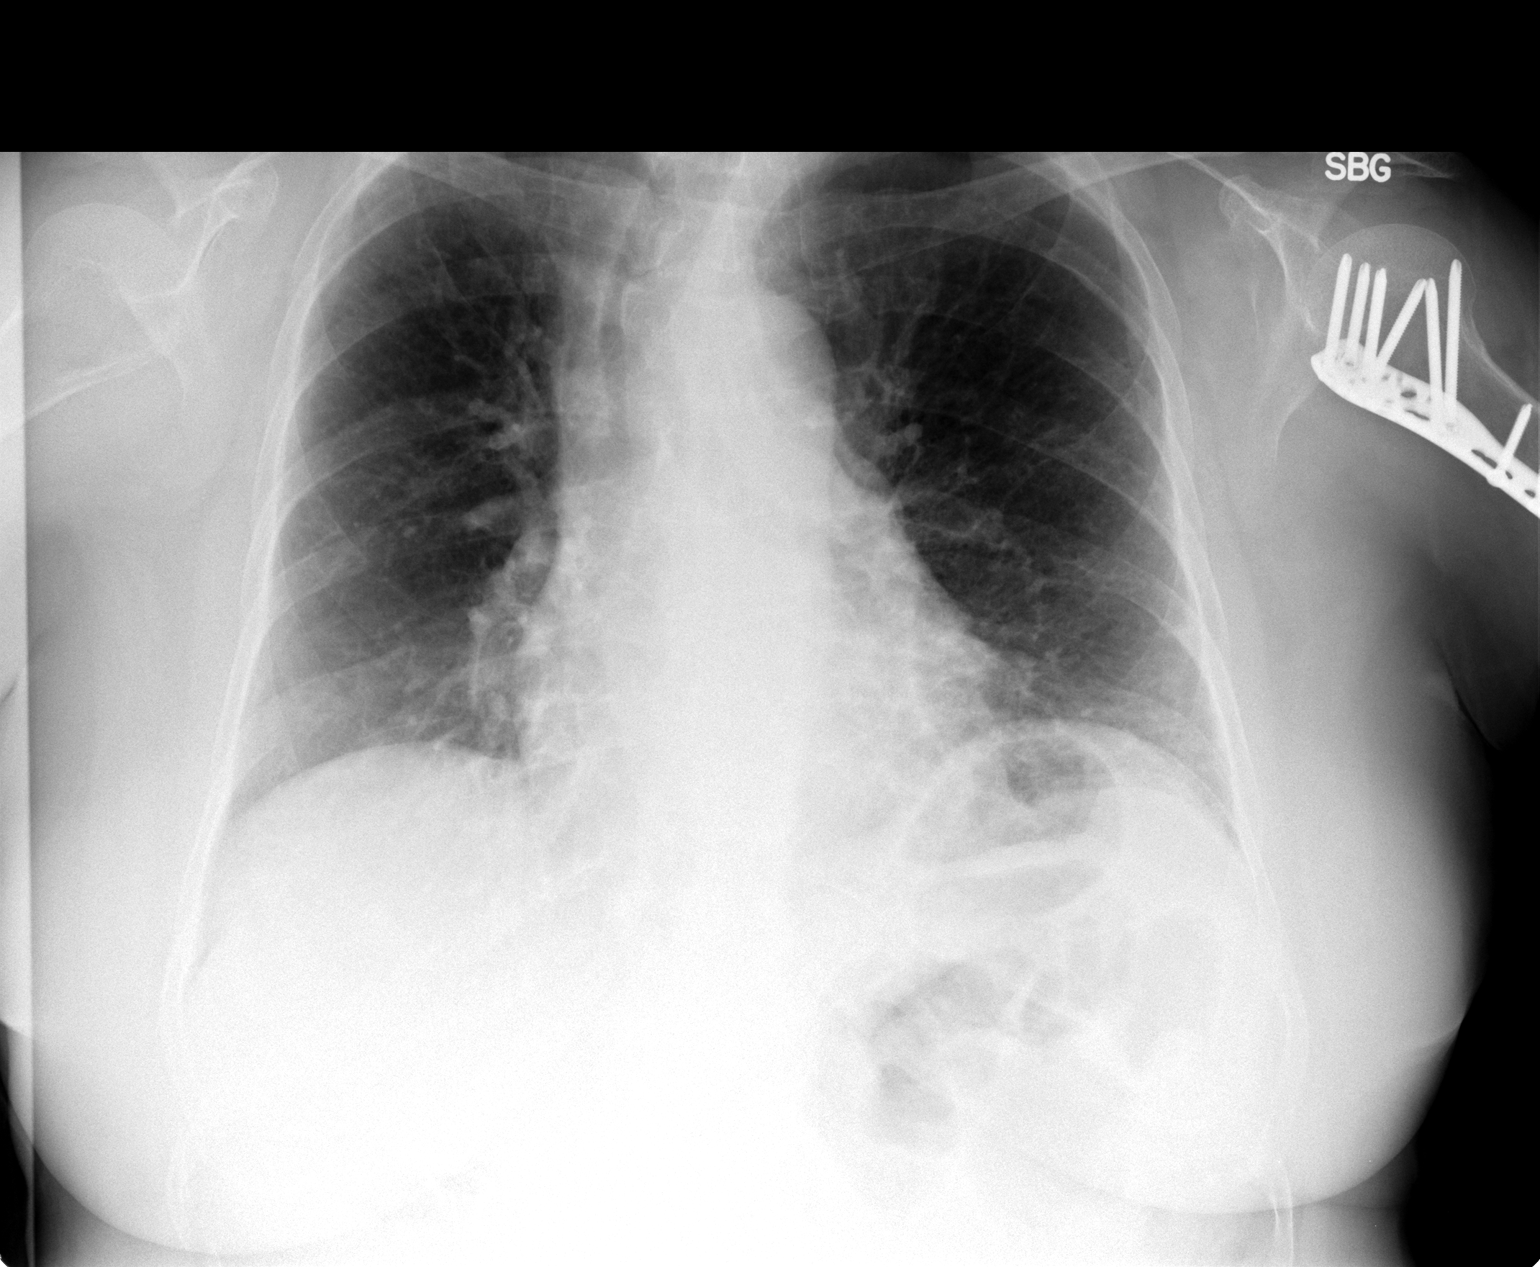

[view not recorded (2 of 2)]
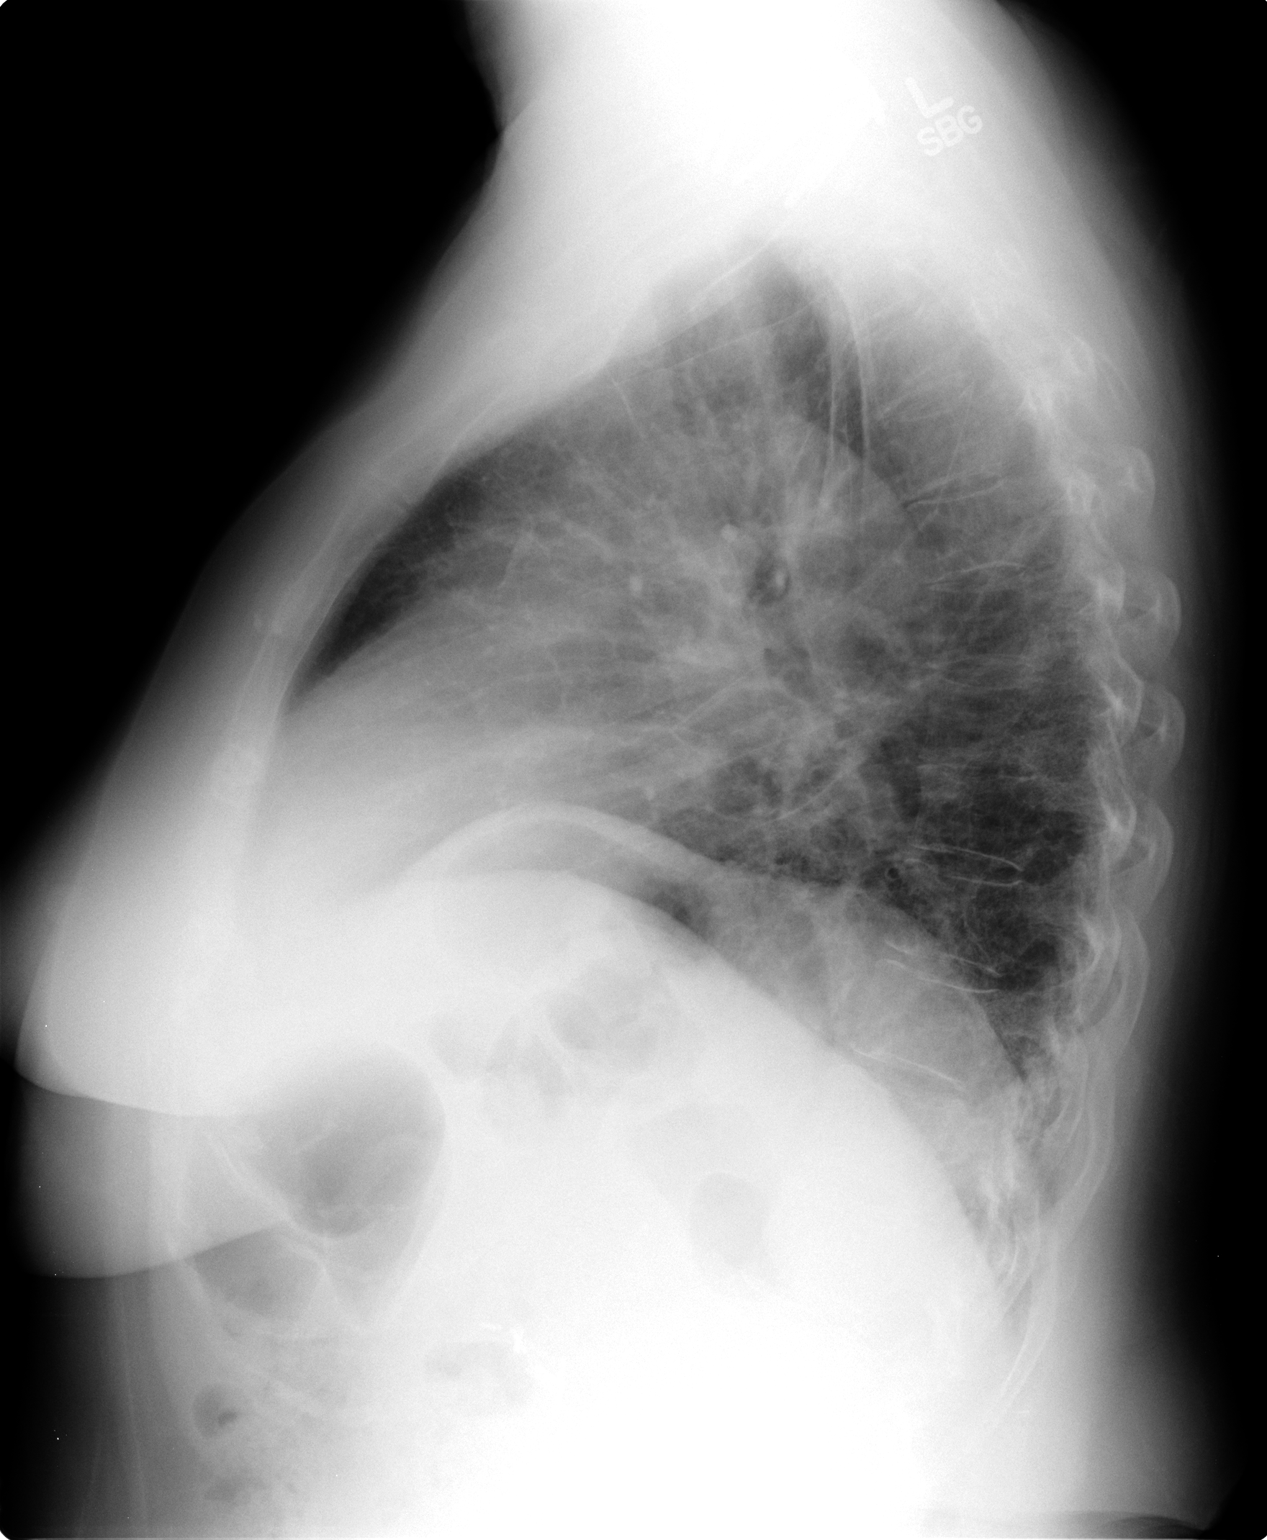

[2 of 2 positions shown; findings below may reference images not displayed]

FINDINGS: The lungs are adequately inflated. The interstitial markings are
coarse especially on the left inferiorly. There is no alveolar
infiltrate. There is no pleural effusion. The heart and pulmonary
vascularity are normal. The bony thorax exhibits no acute
abnormality.
IMPRESSION: There is no acute cardiopulmonary abnormality. Coarse lung markings
predominantly at the left base likely reflect known pulmonary
fibrosis.

## 2017-02-20 DIAGNOSIS — L0390 Cellulitis, unspecified: Secondary | ICD-10-CM | POA: Diagnosis not present

## 2017-02-23 ENCOUNTER — Other Ambulatory Visit: Payer: Self-pay | Admitting: Gastroenterology

## 2017-03-29 DIAGNOSIS — R05 Cough: Secondary | ICD-10-CM | POA: Diagnosis not present

## 2017-03-29 DIAGNOSIS — G2581 Restless legs syndrome: Secondary | ICD-10-CM | POA: Diagnosis not present

## 2017-03-29 DIAGNOSIS — R5383 Other fatigue: Secondary | ICD-10-CM | POA: Diagnosis not present

## 2017-03-29 DIAGNOSIS — J4520 Mild intermittent asthma, uncomplicated: Secondary | ICD-10-CM | POA: Diagnosis not present

## 2017-03-29 DIAGNOSIS — J84112 Idiopathic pulmonary fibrosis: Secondary | ICD-10-CM | POA: Diagnosis not present

## 2017-04-20 ENCOUNTER — Telehealth: Payer: Self-pay | Admitting: Gastroenterology

## 2017-04-20 NOTE — Telephone Encounter (Signed)
Spoke with patient and she states her pulmonologist is Dr. Alcide Clever at [phone]. Called and requested last office note.

## 2017-04-20 NOTE — Telephone Encounter (Signed)
I cannot see pulmonary notes, can we please request recent office visit documentation to review.

## 2017-04-21 NOTE — Telephone Encounter (Signed)
Spoke with Dr. Iona Hansen office. They state they faxed the office note yesterday. Informed her we have not received if she doesn't mind faxing it again. She states she has to go out of the office for a little while but she will fax it this afternoon.

## 2017-04-22 NOTE — Telephone Encounter (Signed)
Ok thanks 

## 2017-04-22 NOTE — Telephone Encounter (Signed)
Estill Bamberg, can we scan the note so I can review it on epic and get back to patient. Thanks

## 2017-04-22 NOTE — Telephone Encounter (Signed)
Received office notes from pulmonologist and I put them in your office to review Dr. Silverio Decamp.   Called patient and explained to her that Dr. Silverio Decamp is the hospital physician this week and may not review until next week. Patient verbalized understanding.

## 2017-04-22 NOTE — Telephone Encounter (Signed)
Dr. Silverio Decamp,  It's actually scanned under Media now if you want to review the office notes. I can set her up for an appt if warrented.

## 2017-04-22 NOTE — Telephone Encounter (Signed)
We can have it urgently scanned into Epic. It will be available tomorrow to look at.

## 2017-04-26 ENCOUNTER — Telehealth: Payer: Self-pay | Admitting: Gastroenterology

## 2017-04-27 ENCOUNTER — Other Ambulatory Visit: Payer: Self-pay

## 2017-04-27 DIAGNOSIS — K7469 Other cirrhosis of liver: Secondary | ICD-10-CM

## 2017-04-27 DIAGNOSIS — K743 Primary biliary cirrhosis: Secondary | ICD-10-CM

## 2017-04-27 NOTE — Telephone Encounter (Signed)
This issue needs to be addressed with Dr. Silverio Decamp who knows her better, prior to starting it, in regards to severity of her liver disease. In short, for patients with mild impairment hepatic impairment Child-Pugh class A cirrhosis), dosing is recommended to be reduced at 100 mg every 12 hours. For those with moderate to severe impairment (Child-Pugh class B or C): use is not recommended. Further, prior to starting this, her LFTs would need to be checked at baseline and then monitored closely. I would order LFTs under Dr. Silverio Decamp and she can discuss this with the patient once they return. Thanks

## 2017-04-27 NOTE — Telephone Encounter (Signed)
Doc of the day Cirrhosis. Last hepatic panel was 11/09/16. There is a question about starting a new medication called Ofev. There patient is concerned about potential side effects which include affecting the liver. This is being looked into by Dr Silverio Decamp. Would it be reasonable to have the patient's LFT drawn at this time? The patient asks for this.

## 2017-04-27 NOTE — Telephone Encounter (Signed)
Discussed this with the patient. She is fine with starting with LFT and thanks the providers for their efforts.

## 2017-04-28 ENCOUNTER — Other Ambulatory Visit (INDEPENDENT_AMBULATORY_CARE_PROVIDER_SITE_OTHER): Payer: Medicare Other

## 2017-04-28 DIAGNOSIS — K743 Primary biliary cirrhosis: Secondary | ICD-10-CM

## 2017-04-28 LAB — HEPATIC FUNCTION PANEL
ALT: 20 U/L (ref 0–35)
AST: 35 U/L (ref 0–37)
Albumin: 4 g/dL (ref 3.5–5.2)
Alkaline Phosphatase: 225 U/L — ABNORMAL HIGH (ref 39–117)
BILIRUBIN DIRECT: 0.1 mg/dL (ref 0.0–0.3)
Total Bilirubin: 0.3 mg/dL (ref 0.2–1.2)
Total Protein: 7.6 g/dL (ref 6.0–8.3)

## 2017-04-30 ENCOUNTER — Other Ambulatory Visit: Payer: Self-pay

## 2017-04-30 MED ORDER — URSODIOL 250 MG PO TABS: 500.0000 mg | ORAL_TABLET | Freq: Every day | ORAL | 3 refills | Status: DC

## 2017-05-25 NOTE — Progress Notes (Signed)
Called patient and discussed results. She started taking Ursodiol last week, picked up Rx from pharmacy. Patient gave her son's phone number [phone]) to call if unable to reach her. Repeat LFT in 1 month. Follow up office visit next available. Thanks

## 2017-05-25 NOTE — Progress Notes (Signed)
I called patient and informed her results. She said that she picked up Rx for Ursodiol last week and has been taking it. Please check LFT in 1 month. Follow up office visit next available.  Patient gave her son's phone number [phone]), if cant reach her.

## 2017-05-26 ENCOUNTER — Telehealth: Payer: Self-pay

## 2017-05-26 NOTE — Telephone Encounter (Signed)
-----   Message from Mauri Pole, MD sent at 05/25/2017  4:35 PM EDT ----- Called patient and discussed results. She picked up Rx for Ursodiol last week and has been taking it.  Recheck LFT in 1 month and follow up in office visit next available Patient gave her son's number [phone]) , requested to call him if unable to reach her home phone.  Beth can you please make this part of patient's chart as phone note? Thanks VN ----- Message ----- From: Greggory Keen, LPN Sent: 44/0/1027  10:32 AM To: Mauri Pole, MD  Patient is not responding to my efforts to contact her.

## 2017-06-18 DIAGNOSIS — G2581 Restless legs syndrome: Secondary | ICD-10-CM | POA: Diagnosis not present

## 2017-06-18 DIAGNOSIS — R5383 Other fatigue: Secondary | ICD-10-CM | POA: Diagnosis not present

## 2017-06-18 DIAGNOSIS — J4520 Mild intermittent asthma, uncomplicated: Secondary | ICD-10-CM | POA: Diagnosis not present

## 2017-06-18 DIAGNOSIS — J84112 Idiopathic pulmonary fibrosis: Secondary | ICD-10-CM | POA: Diagnosis not present

## 2017-06-24 ENCOUNTER — Other Ambulatory Visit: Payer: Self-pay | Admitting: Gastroenterology

## 2017-07-01 DIAGNOSIS — I5181 Takotsubo syndrome: Secondary | ICD-10-CM | POA: Diagnosis not present

## 2017-07-01 DIAGNOSIS — I4510 Unspecified right bundle-branch block: Secondary | ICD-10-CM | POA: Diagnosis not present

## 2017-07-01 DIAGNOSIS — Z23 Encounter for immunization: Secondary | ICD-10-CM | POA: Diagnosis not present

## 2017-07-01 DIAGNOSIS — S62616A Displaced fracture of proximal phalanx of right little finger, initial encounter for closed fracture: Secondary | ICD-10-CM | POA: Diagnosis not present

## 2017-07-01 DIAGNOSIS — R079 Chest pain, unspecified: Secondary | ICD-10-CM | POA: Diagnosis not present

## 2017-07-01 DIAGNOSIS — S92501B Displaced unspecified fracture of right lesser toe(s), initial encounter for open fracture: Secondary | ICD-10-CM | POA: Diagnosis not present

## 2017-07-01 DIAGNOSIS — R918 Other nonspecific abnormal finding of lung field: Secondary | ICD-10-CM | POA: Diagnosis not present

## 2017-07-02 DIAGNOSIS — J45909 Unspecified asthma, uncomplicated: Secondary | ICD-10-CM | POA: Diagnosis not present

## 2017-07-02 DIAGNOSIS — S92511B Displaced fracture of proximal phalanx of right lesser toe(s), initial encounter for open fracture: Secondary | ICD-10-CM | POA: Diagnosis not present

## 2017-07-02 DIAGNOSIS — I4510 Unspecified right bundle-branch block: Secondary | ICD-10-CM | POA: Diagnosis present

## 2017-07-02 DIAGNOSIS — R0789 Other chest pain: Secondary | ICD-10-CM | POA: Diagnosis not present

## 2017-07-02 DIAGNOSIS — S92501S Displaced unspecified fracture of right lesser toe(s), sequela: Secondary | ICD-10-CM | POA: Diagnosis not present

## 2017-07-02 DIAGNOSIS — S92501B Displaced unspecified fracture of right lesser toe(s), initial encounter for open fracture: Secondary | ICD-10-CM | POA: Diagnosis not present

## 2017-07-02 DIAGNOSIS — S62616A Displaced fracture of proximal phalanx of right little finger, initial encounter for closed fracture: Secondary | ICD-10-CM | POA: Diagnosis not present

## 2017-07-02 DIAGNOSIS — E785 Hyperlipidemia, unspecified: Secondary | ICD-10-CM | POA: Diagnosis not present

## 2017-07-02 DIAGNOSIS — I5181 Takotsubo syndrome: Secondary | ICD-10-CM | POA: Diagnosis not present

## 2017-07-02 DIAGNOSIS — R12 Heartburn: Secondary | ICD-10-CM | POA: Diagnosis present

## 2017-07-02 DIAGNOSIS — R918 Other nonspecific abnormal finding of lung field: Secondary | ICD-10-CM | POA: Diagnosis not present

## 2017-07-02 DIAGNOSIS — Z01810 Encounter for preprocedural cardiovascular examination: Secondary | ICD-10-CM | POA: Diagnosis not present

## 2017-07-02 DIAGNOSIS — Z9181 History of falling: Secondary | ICD-10-CM | POA: Diagnosis not present

## 2017-07-02 DIAGNOSIS — I739 Peripheral vascular disease, unspecified: Secondary | ICD-10-CM | POA: Diagnosis not present

## 2017-07-02 DIAGNOSIS — W19XXXA Unspecified fall, initial encounter: Secondary | ICD-10-CM | POA: Diagnosis not present

## 2017-07-02 DIAGNOSIS — Z79899 Other long term (current) drug therapy: Secondary | ICD-10-CM | POA: Diagnosis not present

## 2017-07-02 DIAGNOSIS — J9611 Chronic respiratory failure with hypoxia: Secondary | ICD-10-CM | POA: Diagnosis not present

## 2017-07-02 DIAGNOSIS — R079 Chest pain, unspecified: Secondary | ICD-10-CM | POA: Diagnosis not present

## 2017-07-02 DIAGNOSIS — J8410 Pulmonary fibrosis, unspecified: Secondary | ICD-10-CM | POA: Diagnosis not present

## 2017-07-03 DIAGNOSIS — S92501B Displaced unspecified fracture of right lesser toe(s), initial encounter for open fracture: Secondary | ICD-10-CM

## 2017-07-04 DIAGNOSIS — S92501S Displaced unspecified fracture of right lesser toe(s), sequela: Secondary | ICD-10-CM

## 2017-07-06 ENCOUNTER — Encounter: Payer: Self-pay | Admitting: Sports Medicine

## 2017-07-07 DIAGNOSIS — M810 Age-related osteoporosis without current pathological fracture: Secondary | ICD-10-CM | POA: Diagnosis not present

## 2017-07-07 DIAGNOSIS — Z79899 Other long term (current) drug therapy: Secondary | ICD-10-CM | POA: Diagnosis not present

## 2017-07-07 DIAGNOSIS — R079 Chest pain, unspecified: Secondary | ICD-10-CM | POA: Diagnosis not present

## 2017-07-07 DIAGNOSIS — Z9181 History of falling: Secondary | ICD-10-CM | POA: Diagnosis not present

## 2017-07-07 DIAGNOSIS — E7800 Pure hypercholesterolemia, unspecified: Secondary | ICD-10-CM | POA: Diagnosis not present

## 2017-07-07 DIAGNOSIS — S8291XB Unspecified fracture of right lower leg, initial encounter for open fracture type I or II: Secondary | ICD-10-CM | POA: Diagnosis not present

## 2017-07-07 DIAGNOSIS — Z1239 Encounter for other screening for malignant neoplasm of breast: Secondary | ICD-10-CM | POA: Diagnosis not present

## 2017-07-07 DIAGNOSIS — F5109 Other insomnia not due to a substance or known physiological condition: Secondary | ICD-10-CM | POA: Diagnosis not present

## 2017-07-07 DIAGNOSIS — R946 Abnormal results of thyroid function studies: Secondary | ICD-10-CM | POA: Diagnosis not present

## 2017-07-12 DIAGNOSIS — G4733 Obstructive sleep apnea (adult) (pediatric): Secondary | ICD-10-CM | POA: Diagnosis not present

## 2017-07-20 DIAGNOSIS — E7800 Pure hypercholesterolemia, unspecified: Secondary | ICD-10-CM | POA: Diagnosis not present

## 2017-07-20 DIAGNOSIS — Z79899 Other long term (current) drug therapy: Secondary | ICD-10-CM | POA: Diagnosis not present

## 2017-07-20 DIAGNOSIS — R946 Abnormal results of thyroid function studies: Secondary | ICD-10-CM | POA: Diagnosis not present

## 2017-07-23 ENCOUNTER — Ambulatory Visit (INDEPENDENT_AMBULATORY_CARE_PROVIDER_SITE_OTHER): Payer: Medicare Other

## 2017-07-23 ENCOUNTER — Ambulatory Visit: Payer: Medicare Other | Admitting: Gastroenterology

## 2017-07-23 ENCOUNTER — Encounter: Payer: Self-pay | Admitting: Sports Medicine

## 2017-07-23 ENCOUNTER — Telehealth: Payer: Self-pay | Admitting: Gastroenterology

## 2017-07-23 ENCOUNTER — Ambulatory Visit (INDEPENDENT_AMBULATORY_CARE_PROVIDER_SITE_OTHER): Payer: Medicare Other | Admitting: Sports Medicine

## 2017-07-23 DIAGNOSIS — S92351D Displaced fracture of fifth metatarsal bone, right foot, subsequent encounter for fracture with routine healing: Secondary | ICD-10-CM | POA: Diagnosis not present

## 2017-07-23 DIAGNOSIS — M79671 Pain in right foot: Secondary | ICD-10-CM

## 2017-07-23 DIAGNOSIS — Z9889 Other specified postprocedural states: Secondary | ICD-10-CM

## 2017-07-23 NOTE — Telephone Encounter (Signed)
Dr. Silverio Decamp, do you want to charge?

## 2017-07-23 NOTE — Progress Notes (Signed)
Subjective: Sherry Wang is a 76 y.o. female patient seen today in office for POV #1(DOS 07/04/2017), S/P open reduction external fixation right fifth toe. Patient denies pain at surgical site states that when she does have pain she is taking Tylenol, denies calf pain, denies headache, chest pain, shortness of breath, nausea, vomiting, fever, or chills.  Reports previous episodes of nausea has been related to other medications that she is on however nothing at this time.  Patient states that she left the dressing on her foot since the date of surgery which was about 3 weeks ago.  Patient does recall her pin coming out. No other issues noted.   Patient Active Problem List   Diagnosis Date Noted  . Cirrhosis of liver without ascites ([place])   . Gastroesophageal reflux disease   . 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 Abnormal CT of the chest 11/28/2014  . Anemia, iron deficiency 11/28/2014  . 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 CAP (community acquired pneumonia) 08/07/2014  . Cough 08/06/2014    Current Outpatient Medications on File Prior to Visit  Medication Sig Dispense Refill  . amitriptyline (ELAVIL) 10 MG tablet Take 10-20 mg by mouth at bedtime.     . budesonide-formoterol (SYMBICORT) 160-4.5 MCG/ACT inhaler Inhale 2 puffs into the lungs every evening.     . calcium carbonate (TUMS - DOSED IN MG ELEMENTAL CALCIUM) 500 MG chewable tablet Chew 1 tablet by mouth daily.    . Cholecalciferol (VITAMIN D3) 2000 UNITS TABS Take 1 tablet by mouth daily.    . cyanocobalamin 1000 MCG tablet Take 1,000 mcg by mouth every evening.    Marland Kitchen esomeprazole (NEXIUM) 40 MG capsule Take 1 capsule (40 mg total) by mouth daily before breakfast. (Patient taking differently: Take 40 mg by mouth every evening. ) 30 capsule 5  . fluticasone (FLONASE) 50 MCG/ACT nasal spray Place 2 sprays into both nostrils every evening.     Marland Kitchen ipratropium-albuterol (DUONEB)  0.5-2.5 (3) MG/3ML SOLN Take 3 mLs by nebulization every 6 (six) hours as needed.    . montelukast (SINGULAIR) 10 MG tablet Take 10 mg by mouth daily.    . Multiple Vitamin (MULTIVITAMIN) capsule Take 1 capsule by mouth daily before supper.     . nitroGLYCERIN (NITROSTAT) 0.4 MG SL tablet Place 0.4 mg under the tongue every 5 (five) minutes as needed for chest pain.     . ranitidine (ZANTAC) 150 MG tablet Take 1 tablet (150 mg total) by mouth 2 (two) times daily. (Patient taking differently: Take 150 mg by mouth every evening. ) 60 tablet 6  . sertraline (ZOLOFT) 100 MG tablet Take 200 mg by mouth at bedtime. Pt takes 2 tablets, by mouth, every day    . spironolactone (ALDACTONE) 25 MG tablet TAKE 1 TABLET BY MOUTH ONCE DAILY 30 tablet 3  . ursodiol (ACTIGALL) 250 MG tablet Take 2 tablets (500 mg total) by mouth daily. 60 tablet 3  . zolpidem (AMBIEN) 5 MG tablet Take 5 mg by mouth at bedtime as needed for sleep.     No current facility-administered medications on file prior to visit.     Allergies  Allergen Reactions  . Paxil [Paroxetine Hcl] Itching  . Tamiflu [Oseltamivir Phosphate]     "I just get sick"    Objective: There were no vitals filed for this visit.  General: No acute distress, AAOx3  Right foot: Sutures intact with no gapping or dehiscence at surgical site,  mild swelling to right fifth toe, no erythema, no warmth, no drainage, no signs of infection noted, Capillary fill time <3 seconds in all digits, gross sensation present via light touch to right foot. No pain or crepitation with range of motion right foot.  No pain with calf compression.   Post Op Xray, Right foot: Transverse fracture of the base of the proximal phalanx to the fifth toe with much improved position compared to preoperative x-rays at [place] has been removed by patient.  Previous other digital fractures have well-healed.  Soft tissue swelling within normal limits for post op status.    Assessment and Plan:  Problem List Items Addressed This Visit    None    Visit Diagnoses    Open displaced fracture of fifth metatarsal bone of right foot with routine healing, subsequent encounter    -  Primary   Relevant Orders   DG Foot Complete Right   Right foot pain       S/P foot surgery, right          -Patient seen and evaluated -X-rays reviewed -Sutures removed and applied surgitube compression sleeve to use as instructed -Advised patient to continue with post-op shoe on right foot for 1 more week then may transition to a good supportive tennis shoe; patient to refrain from driving until she is in normal shoe -Advised patient to limit activity to necessity  -Advised patient to ice and elevate as necessary  -Continue with Tylenol as needed for pain -Will plan for postop check at next office visit. In the meantime, patient to call office if any issues or problems arise.   Landis Martins, DPM

## 2017-07-23 NOTE — Telephone Encounter (Signed)
No charge. 

## 2017-08-13 DIAGNOSIS — G4733 Obstructive sleep apnea (adult) (pediatric): Secondary | ICD-10-CM | POA: Diagnosis not present

## 2017-08-18 ENCOUNTER — Encounter: Payer: Self-pay | Admitting: Sports Medicine

## 2017-08-18 ENCOUNTER — Ambulatory Visit (INDEPENDENT_AMBULATORY_CARE_PROVIDER_SITE_OTHER): Payer: Medicare Other | Admitting: Sports Medicine

## 2017-08-18 DIAGNOSIS — Z9889 Other specified postprocedural states: Secondary | ICD-10-CM

## 2017-08-18 DIAGNOSIS — B351 Tinea unguium: Secondary | ICD-10-CM

## 2017-08-18 DIAGNOSIS — S92351D Displaced fracture of fifth metatarsal bone, right foot, subsequent encounter for fracture with routine healing: Secondary | ICD-10-CM

## 2017-08-18 DIAGNOSIS — M79671 Pain in right foot: Secondary | ICD-10-CM

## 2017-08-18 NOTE — Patient Instructions (Signed)
Vinegar soaks 1 cup of white distilled vinegar to 8 cups of warm water.  Soak 20 mins. May repeat soak two times per week.  If there is thickness to nails may file nails after soaks or after bath/shower with nail file and apply tea tree oil or vicks vapor rub. Apply oil or rub daily to nails after filing for the best result.

## 2017-08-18 NOTE — Progress Notes (Signed)
Subjective: Sherry Wang is a 77 y.o. female patient seen today in office for POV #2(DOS 07/04/2017), S/P open reduction external fixation right fifth toe. Patient denies pain at surgical site states that she does have some swelling to the toe but otherwise is good.  Patient states that she thinks she has fungus on the toes. No other issues noted.   Patient Active Problem List   Diagnosis Date Noted  . Cirrhosis of liver without ascites ([place])   . Gastroesophageal reflux disease   . 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 Abnormal CT of the chest 11/28/2014  . Anemia, iron deficiency 11/28/2014  . 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 CAP (community acquired pneumonia) 08/07/2014  . Cough 08/06/2014    Current Outpatient Medications on File Prior to Visit  Medication Sig Dispense Refill  . amitriptyline (ELAVIL) 10 MG tablet Take 10-20 mg by mouth at bedtime.     . budesonide-formoterol (SYMBICORT) 160-4.5 MCG/ACT inhaler Inhale 2 puffs into the lungs every evening.     . calcium carbonate (TUMS - DOSED IN MG ELEMENTAL CALCIUM) 500 MG chewable tablet Chew 1 tablet by mouth daily.    . Cholecalciferol (VITAMIN D3) 2000 UNITS TABS Take 1 tablet by mouth daily.    . cyanocobalamin 1000 MCG tablet Take 1,000 mcg by mouth every evening.    Marland Kitchen esomeprazole (NEXIUM) 40 MG capsule Take 1 capsule (40 mg total) by mouth daily before breakfast. (Patient taking differently: Take 40 mg by mouth every evening. ) 30 capsule 5  . fluticasone (FLONASE) 50 MCG/ACT nasal spray Place 2 sprays into both nostrils every evening.     Marland Kitchen ipratropium-albuterol (DUONEB) 0.5-2.5 (3) MG/3ML SOLN Take 3 mLs by nebulization every 6 (six) hours as needed.    . montelukast (SINGULAIR) 10 MG tablet Take 10 mg by mouth daily.    . Multiple Vitamin (MULTIVITAMIN) capsule Take 1 capsule by mouth daily before supper.     . nitroGLYCERIN (NITROSTAT) 0.4 MG SL tablet Place  0.4 mg under the tongue every 5 (five) minutes as needed for chest pain.     . ranitidine (ZANTAC) 150 MG tablet Take 1 tablet (150 mg total) by mouth 2 (two) times daily. (Patient taking differently: Take 150 mg by mouth every evening. ) 60 tablet 6  . sertraline (ZOLOFT) 100 MG tablet Take 200 mg by mouth at bedtime. Pt takes 2 tablets, by mouth, every day    . spironolactone (ALDACTONE) 25 MG tablet TAKE 1 TABLET BY MOUTH ONCE DAILY 30 tablet 3  . ursodiol (ACTIGALL) 250 MG tablet Take 2 tablets (500 mg total) by mouth daily. 60 tablet 3  . zolpidem (AMBIEN) 5 MG tablet Take 5 mg by mouth at bedtime as needed for sleep.     No current facility-administered medications on file prior to visit.     Allergies  Allergen Reactions  . Paxil [Paroxetine Hcl] Itching  . Tamiflu [Oseltamivir Phosphate]     "I just get sick"    Objective: There were no vitals filed for this visit.  General: No acute distress, AAOx3  Right foot: Surgical site well healed, mild swelling to right fifth toe, no erythema, no warmth, no drainage, no signs of infection noted, Capillary fill time <3 seconds in all digits, gross sensation present via light touch to right foot. No pain or crepitation with range of motion right foot.  No pain with calf compression.   Nails are short and thick  consistent with mycosis.   Assessment and Plan:  Problem List Items Addressed This Visit    None    Visit Diagnoses    Open displaced fracture of fifth metatarsal bone of right foot with routine healing, subsequent encounter    -  Primary   Right foot pain       S/P foot surgery, right       Dermatophytosis of nail          -Patient seen and evaluated -Applied surgitube compression sleeve to use as instructed -Advised patient may transition to a good supportive tennis shoe; patient to refrain from driving until she is in normal shoe -Advised patient to limit activity to necessity  -Advised patient to ice and elevate as  necessary  -Continue with Tylenol as needed for pain -Recommend patient to try vicks/tea tree oil to nails and soaks, if continues will do fungal culture  -Will plan for postop check and xrays at next office visit. In the meantime, patient to call office if any issues or problems arise.   Landis Martins, DPM

## 2017-09-06 DIAGNOSIS — G4733 Obstructive sleep apnea (adult) (pediatric): Secondary | ICD-10-CM | POA: Diagnosis not present

## 2017-09-06 DIAGNOSIS — G2581 Restless legs syndrome: Secondary | ICD-10-CM | POA: Diagnosis not present

## 2017-09-06 DIAGNOSIS — J4520 Mild intermittent asthma, uncomplicated: Secondary | ICD-10-CM | POA: Diagnosis not present

## 2017-09-06 DIAGNOSIS — R5383 Other fatigue: Secondary | ICD-10-CM | POA: Diagnosis not present

## 2017-09-06 DIAGNOSIS — J84112 Idiopathic pulmonary fibrosis: Secondary | ICD-10-CM | POA: Diagnosis not present

## 2017-09-09 ENCOUNTER — Other Ambulatory Visit: Payer: Self-pay | Admitting: Sports Medicine

## 2017-09-09 DIAGNOSIS — Z9889 Other specified postprocedural states: Secondary | ICD-10-CM

## 2017-09-09 DIAGNOSIS — S92351D Displaced fracture of fifth metatarsal bone, right foot, subsequent encounter for fracture with routine healing: Secondary | ICD-10-CM

## 2017-09-20 DIAGNOSIS — Z0000 Encounter for general adult medical examination without abnormal findings: Secondary | ICD-10-CM | POA: Diagnosis not present

## 2017-09-20 DIAGNOSIS — K219 Gastro-esophageal reflux disease without esophagitis: Secondary | ICD-10-CM | POA: Diagnosis not present

## 2017-09-20 DIAGNOSIS — F5109 Other insomnia not due to a substance or known physiological condition: Secondary | ICD-10-CM | POA: Diagnosis not present

## 2017-09-20 DIAGNOSIS — F320 Major depressive disorder, single episode, mild: Secondary | ICD-10-CM | POA: Diagnosis not present

## 2017-09-20 DIAGNOSIS — M546 Pain in thoracic spine: Secondary | ICD-10-CM | POA: Diagnosis not present

## 2017-09-20 DIAGNOSIS — M810 Age-related osteoporosis without current pathological fracture: Secondary | ICD-10-CM | POA: Diagnosis not present

## 2017-09-20 DIAGNOSIS — J310 Chronic rhinitis: Secondary | ICD-10-CM | POA: Diagnosis not present

## 2017-09-20 DIAGNOSIS — Z79899 Other long term (current) drug therapy: Secondary | ICD-10-CM | POA: Diagnosis not present

## 2017-09-20 DIAGNOSIS — Z6829 Body mass index (BMI) 29.0-29.9, adult: Secondary | ICD-10-CM | POA: Diagnosis not present

## 2017-09-20 DIAGNOSIS — K743 Primary biliary cirrhosis: Secondary | ICD-10-CM | POA: Diagnosis not present

## 2017-09-20 DIAGNOSIS — E7800 Pure hypercholesterolemia, unspecified: Secondary | ICD-10-CM | POA: Diagnosis not present

## 2017-09-21 DIAGNOSIS — G4733 Obstructive sleep apnea (adult) (pediatric): Secondary | ICD-10-CM | POA: Diagnosis not present

## 2017-09-24 DIAGNOSIS — N398 Other specified disorders of urinary system: Secondary | ICD-10-CM | POA: Diagnosis not present

## 2017-09-24 DIAGNOSIS — R112 Nausea with vomiting, unspecified: Secondary | ICD-10-CM | POA: Diagnosis not present

## 2017-09-24 DIAGNOSIS — J22 Unspecified acute lower respiratory infection: Secondary | ICD-10-CM | POA: Diagnosis not present

## 2017-09-27 DIAGNOSIS — J4520 Mild intermittent asthma, uncomplicated: Secondary | ICD-10-CM | POA: Diagnosis not present

## 2017-09-27 DIAGNOSIS — J84112 Idiopathic pulmonary fibrosis: Secondary | ICD-10-CM | POA: Diagnosis not present

## 2017-09-27 DIAGNOSIS — G4733 Obstructive sleep apnea (adult) (pediatric): Secondary | ICD-10-CM | POA: Diagnosis not present

## 2017-09-27 DIAGNOSIS — G2581 Restless legs syndrome: Secondary | ICD-10-CM | POA: Diagnosis not present

## 2017-09-29 ENCOUNTER — Encounter: Payer: Self-pay | Admitting: Sports Medicine

## 2017-09-29 ENCOUNTER — Ambulatory Visit (INDEPENDENT_AMBULATORY_CARE_PROVIDER_SITE_OTHER): Payer: Medicare Other

## 2017-09-29 ENCOUNTER — Ambulatory Visit (INDEPENDENT_AMBULATORY_CARE_PROVIDER_SITE_OTHER): Payer: Medicare Other | Admitting: Sports Medicine

## 2017-09-29 DIAGNOSIS — Z9889 Other specified postprocedural states: Secondary | ICD-10-CM

## 2017-09-29 DIAGNOSIS — S92351D Displaced fracture of fifth metatarsal bone, right foot, subsequent encounter for fracture with routine healing: Secondary | ICD-10-CM

## 2017-09-29 NOTE — Progress Notes (Signed)
Subjective: Sherry Wang is a 77 y.o. female patient seen today in office for POV #3(DOS 07/04/2017), S/P open reduction external fixation right fifth toe. Patient denies pain at surgical site states that she is in normal shoe and is doing good. Reports that she wants to wait on doing anything about her nails because they do not bother her. No other issues noted.   Patient Active Problem List   Diagnosis Date Noted  . Cirrhosis of liver without ascites ([place])   . Gastroesophageal reflux disease   . 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 Abnormal CT of the chest 11/28/2014  . Anemia, iron deficiency 11/28/2014  . 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 CAP (community acquired pneumonia) 08/07/2014  . Cough 08/06/2014    Current Outpatient Medications on File Prior to Visit  Medication Sig Dispense Refill  . amitriptyline (ELAVIL) 10 MG tablet Take 10-20 mg by mouth at bedtime.     . budesonide-formoterol (SYMBICORT) 160-4.5 MCG/ACT inhaler Inhale 2 puffs into the lungs every evening.     . calcium carbonate (TUMS - DOSED IN MG ELEMENTAL CALCIUM) 500 MG chewable tablet Chew 1 tablet by mouth daily.    . Cholecalciferol (VITAMIN D3) 2000 UNITS TABS Take 1 tablet by mouth daily.    . cyanocobalamin 1000 MCG tablet Take 1,000 mcg by mouth every evening.    Marland Kitchen esomeprazole (NEXIUM) 40 MG capsule Take 1 capsule (40 mg total) by mouth daily before breakfast. (Patient taking differently: Take 40 mg by mouth every evening. ) 30 capsule 5  . fluticasone (FLONASE) 50 MCG/ACT nasal spray Place 2 sprays into both nostrils every evening.     Marland Kitchen ipratropium-albuterol (DUONEB) 0.5-2.5 (3) MG/3ML SOLN Take 3 mLs by nebulization every 6 (six) hours as needed.    . montelukast (SINGULAIR) 10 MG tablet Take 10 mg by mouth daily.    . Multiple Vitamin (MULTIVITAMIN) capsule Take 1 capsule by mouth daily before supper.     . nitroGLYCERIN (NITROSTAT) 0.4 MG SL  tablet Place 0.4 mg under the tongue every 5 (five) minutes as needed for chest pain.     . ranitidine (ZANTAC) 150 MG tablet Take 1 tablet (150 mg total) by mouth 2 (two) times daily. (Patient taking differently: Take 150 mg by mouth every evening. ) 60 tablet 6  . sertraline (ZOLOFT) 100 MG tablet Take 200 mg by mouth at bedtime. Pt takes 2 tablets, by mouth, every day    . spironolactone (ALDACTONE) 25 MG tablet TAKE 1 TABLET BY MOUTH ONCE DAILY 30 tablet 3  . ursodiol (ACTIGALL) 250 MG tablet Take 2 tablets (500 mg total) by mouth daily. 60 tablet 3  . zolpidem (AMBIEN) 5 MG tablet Take 5 mg by mouth at bedtime as needed for sleep.     No current facility-administered medications on file prior to visit.     Allergies  Allergen Reactions  . Paxil [Paroxetine Hcl] Itching  . Tamiflu [Oseltamivir Phosphate]     "I just get sick"    Objective: There were no vitals filed for this visit.  General: No acute distress, AAOx3  Right foot: Surgical site well healed, mild swelling to right fifth toe, no erythema, no warmth, no drainage, no signs of infection noted, Capillary fill time <3 seconds in all digits, gross sensation present via light touch to right foot. No pain or crepitation with range of motion right foot.  No pain with calf compression.   Nails are  short and thick consistent with mycosis distal aspect.   Assessment and Plan:  Problem List Items Addressed This Visit    None    Visit Diagnoses    S/P foot surgery, right    -  Primary   Relevant Orders   DG Foot Complete Right   Open displaced fracture of fifth metatarsal bone of right foot with routine healing, subsequent encounter       Relevant Orders   DG Foot Complete Right      -Patient seen and evaluated -Xrays reviewed, toe fracture is healed  -Advised patient to continue with good supportive shoes -Advised patient to limit activity to tolerance  -Advised patient to ice and elevate as necessary  -Advised patient  swelling may stick around to use compression sleeve or anti-inflammatories PRN -Patient wants to try vicks/tea tree oil to nails and soaks at this time and does not want fungal culture  -Return to office PRN. In the meantime, patient to call office if any issues or problems arise.   Landis Martins, DPM

## 2017-09-29 NOTE — Patient Instructions (Signed)
Vinegar soaks 1 cup of white distilled vinegar to 8 cups of warm water.  Soak 20 mins. May repeat soak two times per week.  If there is thickness to nails may file nails after soaks or after bath/shower with nail file and apply tea tree oil. Apply oil daily to nails after filing for the best result.  

## 2017-10-10 ENCOUNTER — Other Ambulatory Visit: Payer: Self-pay | Admitting: Gastroenterology

## 2017-10-11 DIAGNOSIS — G4733 Obstructive sleep apnea (adult) (pediatric): Secondary | ICD-10-CM | POA: Diagnosis not present

## 2017-10-23 IMAGING — MR MR ABDOMEN WO/W CM
11 of 19 series · 22 of 48 positions shown · IV contrast (multihance)
Comparison: Abdominal ultrasound dated 12/07/2014

CLINICAL DATA: Cirrhosis, elevated AFP, evaluate for HCC. Weight
gain, increased abdominal girth.

EXAM:
MRI ABDOMEN WITHOUT AND WITH CONTRAST
TECHNIQUE: Multiplanar multisequence MR imaging of the abdomen was performed
both before and after the administration of intravenous contrast.
CONTRAST:  17mL MULTIHANCE GADOBENATE DIMEGLUMINE 529 MG/ML IV SOLN

[Series 3: T2 fat-sat · axial · 5.0mm · 0.88mm/px · 1 of 58 slices shown]
[im 1/58]
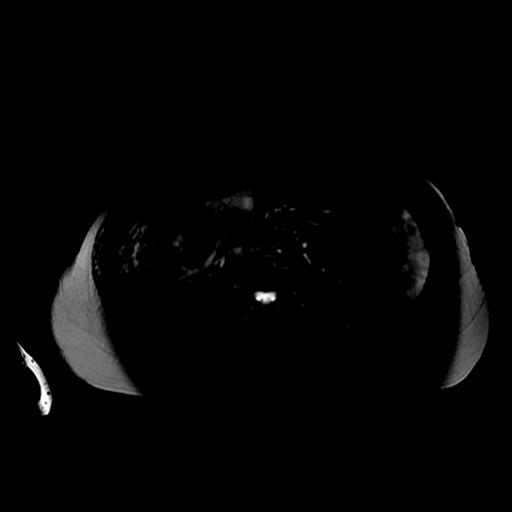

[Series 4: DWI b500 · axial · 6.0mm · 1.72mm/px · z∈[-29,+291]mm · 2 of 84 slices shown]
[im 1/84]
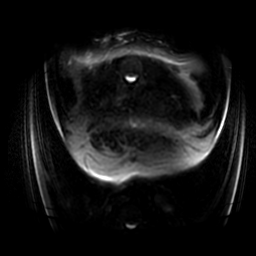
[im 84/84]
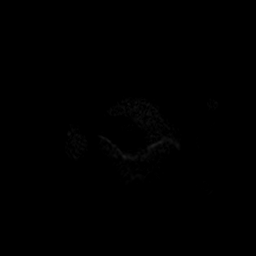

[Series 5: ax dualecho · axial · 5.0mm · 0.88mm/px · z∈[-25,+260]mm · 4 of 116 slices shown]
[im 1/116]
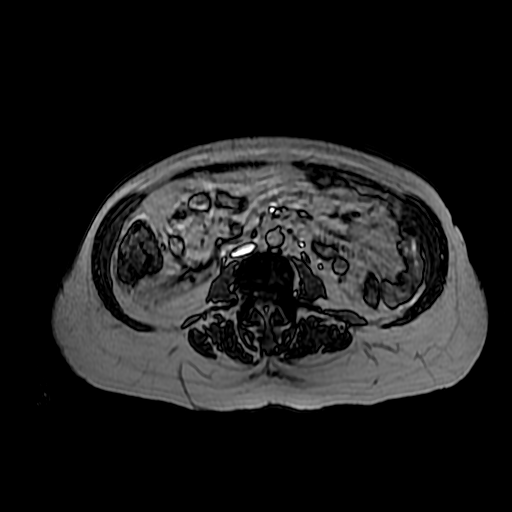
[im 39/116]
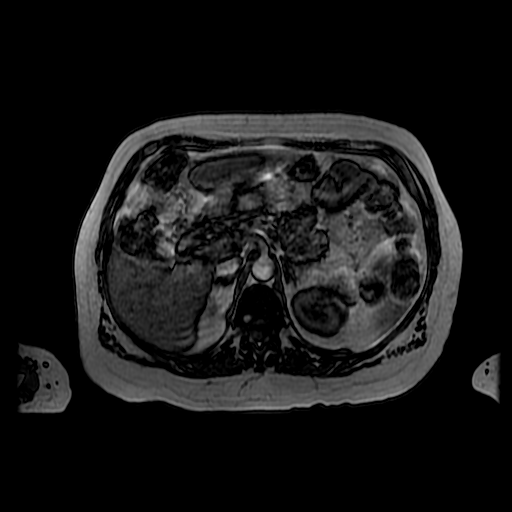
[im 77/116]
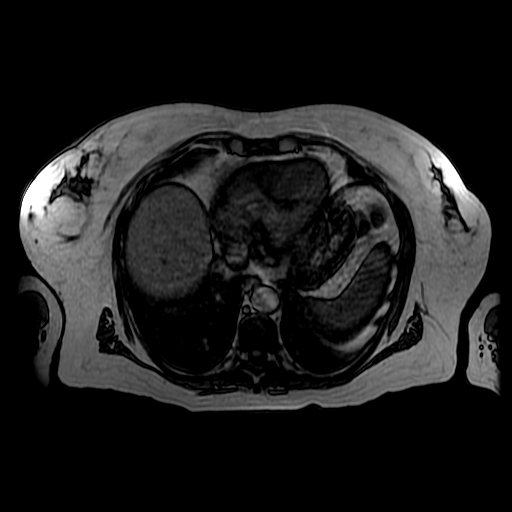
[im 116/116]
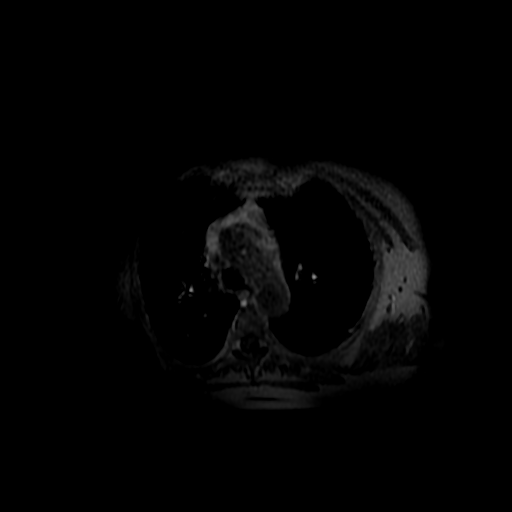

[Series 6: T2 · axial · 5.0mm · 0.88mm/px · z∈[-15,+260]mm · 2 of 56 slices shown (1 of 2)]
[im 1/56]
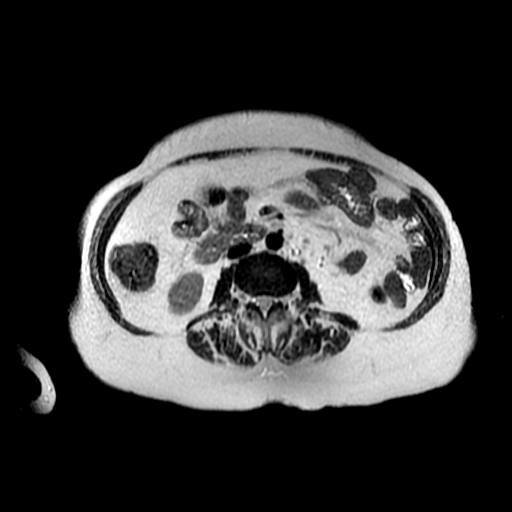
[im 56/56]
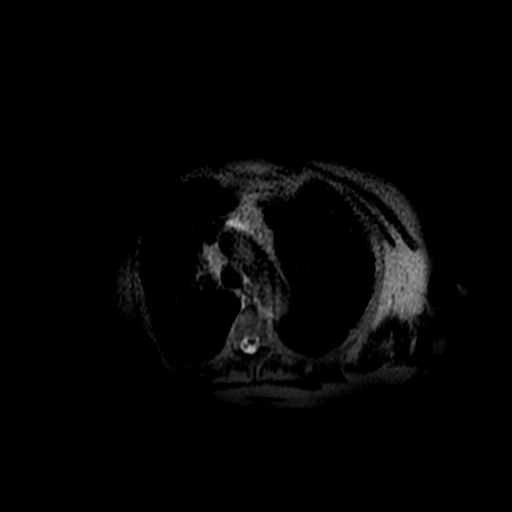

[Series 7: T2 · coronal · 5.0mm · 0.90mm/px · 2 of 52 slices shown (2 of 2)]
[im 1/52]
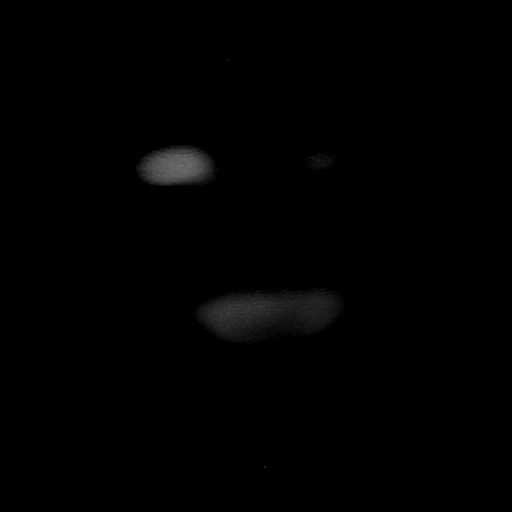
[im 52/52]
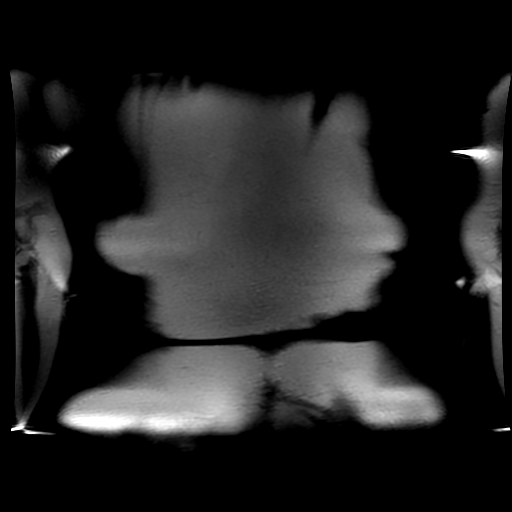

[Series 8: bSSFP · axial · 5.0mm · 0.88mm/px · z∈[-25,+260]mm · 2 of 58 slices shown]
[im 1/58]
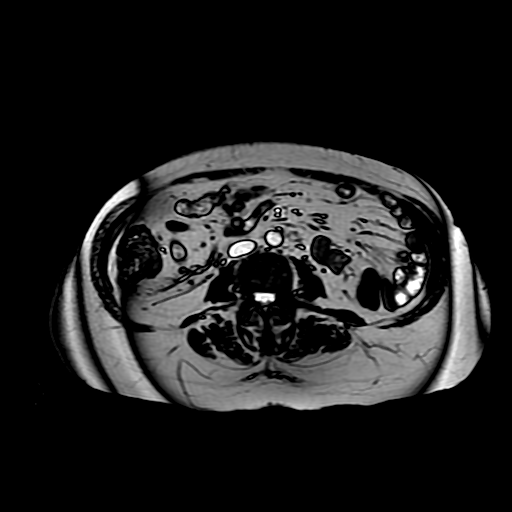
[im 58/58]
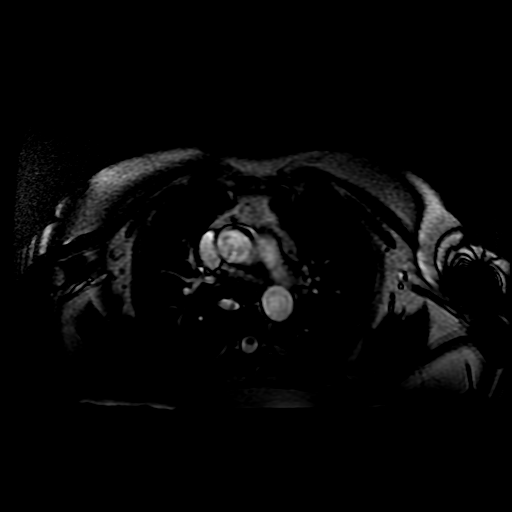

[Series 400: DWI · axial · 6.0mm · 1.72mm/px · 1 of 42 slices shown (1 of 2)]
[im 1/42]
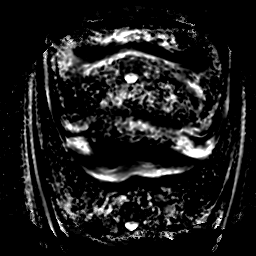

[Series 401: DWI · axial · 6.0mm · 1.72mm/px · 1 of 42 slices shown (2 of 2)]
[im 1/42]
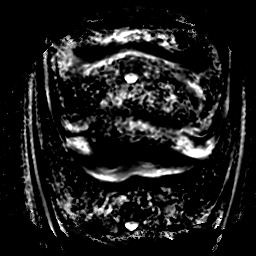

[Series 1000: T1 dynamic · axial · 5.4mm · 0.78mm/px · z∈[-8,+227]mm · 3 of 88 slices shown (1 of 3)]
[im 1/88]
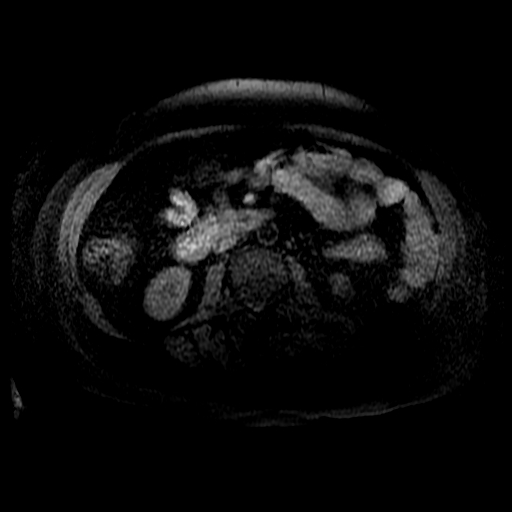
[im 44/88]
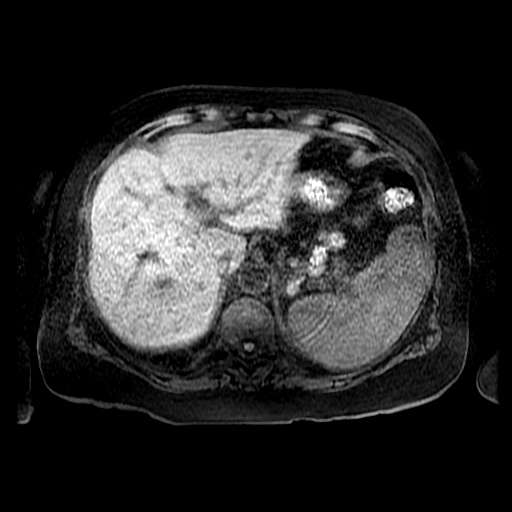
[im 88/88]
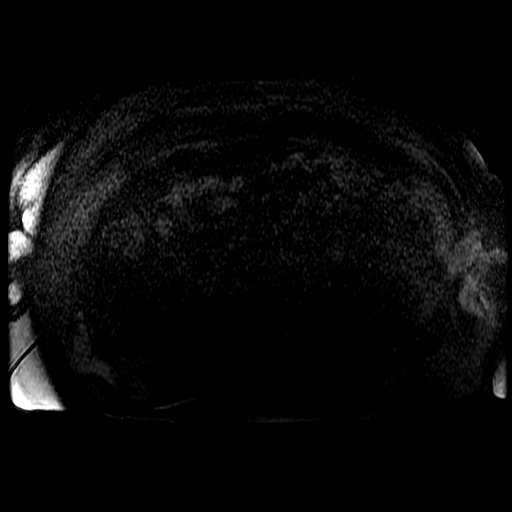

[Series 1001: T1 dynamic · axial · 5.4mm · 0.78mm/px · z∈[-8,+227]mm · 3 of 88 slices shown (2 of 3)]
[im 1/88]
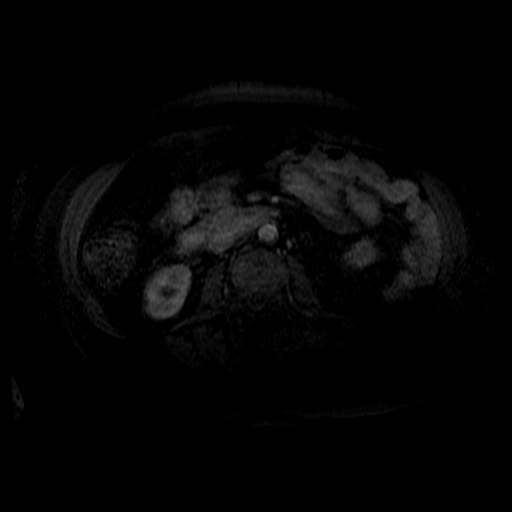
[im 44/88]
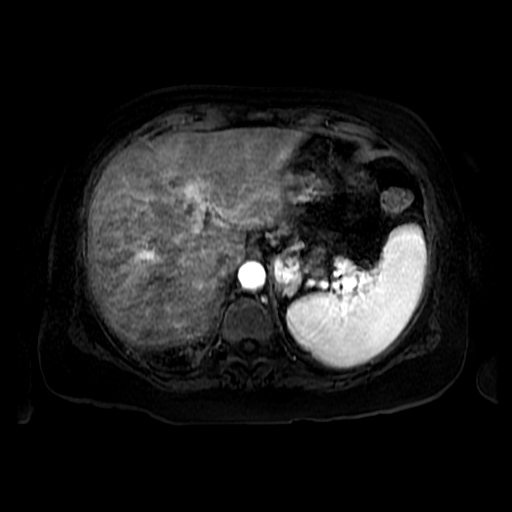
[im 88/88]
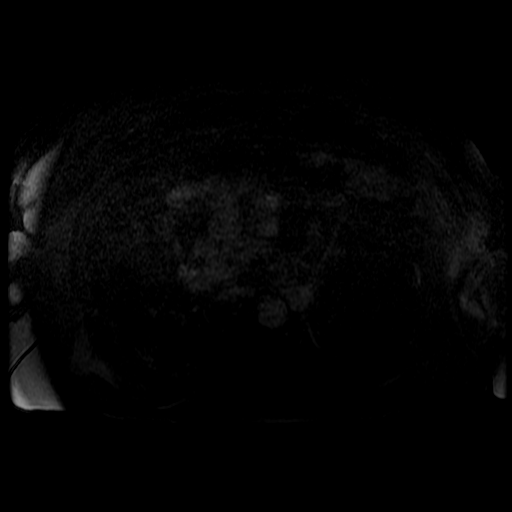

[Series 1002: T1 dynamic · axial · 5.4mm · 0.78mm/px · 1 of 88 slices shown (3 of 3)]
[im 1/88]
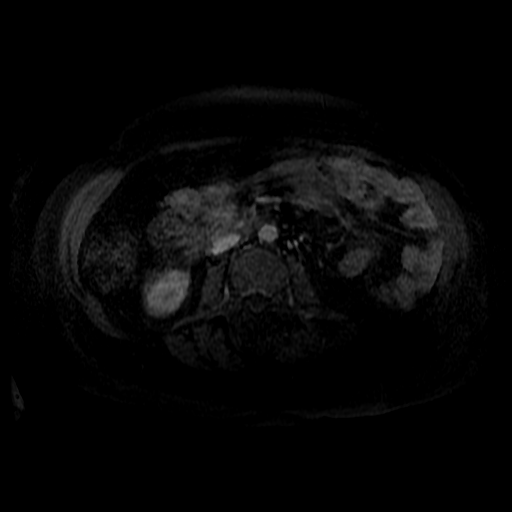

[22 of 48 positions shown; findings below may reference images not displayed]

FINDINGS: Motion degraded images.

Lower chest:  Mild patchy opacities in the bilateral lung bases.

Hepatobiliary: Liver is notable for a subtle nodular hepatic contour
(for example, series 6/ image 42), corresponding to known cirrhosis.
No suspicious/enhancing hepatic lesions. No hepatic steatosis.

Status post cholecystectomy. No intrahepatic or extrahepatic ductal
dilatation.

Pancreas: Within normal limits.

Spleen: Within normal limits.

Adrenals/Urinary Tract: Adrenal glands within normal limits.

Kidneys within normal limits.  No hydronephrosis.

Stomach/Bowel: Stomach is within normal limits.

Visualized bowel is unremarkable.

Vascular/Lymphatic: No evidence of abdominal aortic aneurysm.

Small periportal lymph nodes measuring up to 15 mm short axis
(series 3/image 40), likely reactive.

Other: No abdominal ascites.

Musculoskeletal: No focal osseous lesions.
IMPRESSION: Mildly nodular hepatic contour, corresponding to known cirrhosis.

No findings to suggest hepatocellular carcinoma.

Status post cholecystectomy.

## 2017-11-15 ENCOUNTER — Other Ambulatory Visit: Payer: Self-pay | Admitting: Gastroenterology

## 2017-11-16 DIAGNOSIS — G4733 Obstructive sleep apnea (adult) (pediatric): Secondary | ICD-10-CM | POA: Diagnosis not present

## 2017-11-16 DIAGNOSIS — J84112 Idiopathic pulmonary fibrosis: Secondary | ICD-10-CM | POA: Diagnosis not present

## 2017-11-16 DIAGNOSIS — J4520 Mild intermittent asthma, uncomplicated: Secondary | ICD-10-CM | POA: Diagnosis not present

## 2017-11-16 DIAGNOSIS — G2581 Restless legs syndrome: Secondary | ICD-10-CM | POA: Diagnosis not present

## 2017-11-18 DIAGNOSIS — Z9189 Other specified personal risk factors, not elsewhere classified: Secondary | ICD-10-CM | POA: Diagnosis not present

## 2017-11-22 DIAGNOSIS — M810 Age-related osteoporosis without current pathological fracture: Secondary | ICD-10-CM | POA: Diagnosis not present

## 2017-11-22 DIAGNOSIS — Z1231 Encounter for screening mammogram for malignant neoplasm of breast: Secondary | ICD-10-CM | POA: Diagnosis not present

## 2017-11-22 DIAGNOSIS — M8588 Other specified disorders of bone density and structure, other site: Secondary | ICD-10-CM | POA: Diagnosis not present

## 2017-11-22 DIAGNOSIS — M8589 Other specified disorders of bone density and structure, multiple sites: Secondary | ICD-10-CM | POA: Diagnosis not present

## 2017-12-06 ENCOUNTER — Ambulatory Visit: Payer: Medicare Other | Admitting: Gastroenterology

## 2018-01-07 ENCOUNTER — Other Ambulatory Visit: Payer: Self-pay | Admitting: Gastroenterology

## 2018-02-21 DIAGNOSIS — G4733 Obstructive sleep apnea (adult) (pediatric): Secondary | ICD-10-CM | POA: Diagnosis not present

## 2018-02-23 DIAGNOSIS — G2581 Restless legs syndrome: Secondary | ICD-10-CM | POA: Diagnosis not present

## 2018-02-23 DIAGNOSIS — R5383 Other fatigue: Secondary | ICD-10-CM | POA: Diagnosis not present

## 2018-02-23 DIAGNOSIS — J4520 Mild intermittent asthma, uncomplicated: Secondary | ICD-10-CM | POA: Diagnosis not present

## 2018-02-23 DIAGNOSIS — G4733 Obstructive sleep apnea (adult) (pediatric): Secondary | ICD-10-CM | POA: Diagnosis not present

## 2018-02-23 DIAGNOSIS — J84112 Idiopathic pulmonary fibrosis: Secondary | ICD-10-CM | POA: Diagnosis not present

## 2018-03-01 ENCOUNTER — Ambulatory Visit (INDEPENDENT_AMBULATORY_CARE_PROVIDER_SITE_OTHER): Payer: Medicare Other | Admitting: Gastroenterology

## 2018-03-01 ENCOUNTER — Other Ambulatory Visit (INDEPENDENT_AMBULATORY_CARE_PROVIDER_SITE_OTHER): Payer: Medicare Other

## 2018-03-01 ENCOUNTER — Encounter (INDEPENDENT_AMBULATORY_CARE_PROVIDER_SITE_OTHER): Payer: Self-pay

## 2018-03-01 ENCOUNTER — Encounter: Payer: Self-pay | Admitting: Gastroenterology

## 2018-03-01 VITALS — BP 96/60 | HR 76 | Ht 63.0 in | Wt 140.4 lb

## 2018-03-01 DIAGNOSIS — D508 Other iron deficiency anemias: Secondary | ICD-10-CM | POA: Diagnosis not present

## 2018-03-01 DIAGNOSIS — K219 Gastro-esophageal reflux disease without esophagitis: Secondary | ICD-10-CM

## 2018-03-01 DIAGNOSIS — R772 Abnormality of alphafetoprotein: Secondary | ICD-10-CM

## 2018-03-01 DIAGNOSIS — J8410 Pulmonary fibrosis, unspecified: Secondary | ICD-10-CM | POA: Diagnosis not present

## 2018-03-01 DIAGNOSIS — R079 Chest pain, unspecified: Secondary | ICD-10-CM | POA: Diagnosis not present

## 2018-03-01 DIAGNOSIS — K743 Primary biliary cirrhosis: Secondary | ICD-10-CM

## 2018-03-01 LAB — CBC WITH DIFFERENTIAL/PLATELET
BASOS ABS: 0 10*3/uL (ref 0.0–0.1)
Basophils Relative: 0.6 % (ref 0.0–3.0)
Eosinophils Absolute: 0.1 10*3/uL (ref 0.0–0.7)
Eosinophils Relative: 1.3 % (ref 0.0–5.0)
HCT: 39.9 % (ref 36.0–46.0)
Hemoglobin: 13 g/dL (ref 12.0–15.0)
LYMPHS ABS: 1.7 10*3/uL (ref 0.7–4.0)
Lymphocytes Relative: 24.8 % (ref 12.0–46.0)
MCHC: 32.6 g/dL (ref 30.0–36.0)
MCV: 83.8 fl (ref 78.0–100.0)
MONO ABS: 0.5 10*3/uL (ref 0.1–1.0)
MONOS PCT: 6.8 % (ref 3.0–12.0)
NEUTROS ABS: 4.6 10*3/uL (ref 1.4–7.7)
NEUTROS PCT: 66.5 % (ref 43.0–77.0)
PLATELETS: 236 10*3/uL (ref 150.0–400.0)
RBC: 4.76 Mil/uL (ref 3.87–5.11)
RDW: 16.2 % — ABNORMAL HIGH (ref 11.5–15.5)
WBC: 7 10*3/uL (ref 4.0–10.5)

## 2018-03-01 LAB — COMPREHENSIVE METABOLIC PANEL
ALBUMIN: 4.1 g/dL (ref 3.5–5.2)
ALK PHOS: 120 U/L — AB (ref 39–117)
ALT: 14 U/L (ref 0–35)
AST: 20 U/L (ref 0–37)
BUN: 14 mg/dL (ref 6–23)
CALCIUM: 11 mg/dL — AB (ref 8.4–10.5)
CO2: 28 mEq/L (ref 19–32)
Chloride: 104 mEq/L (ref 96–112)
Creatinine, Ser: 0.99 mg/dL (ref 0.40–1.20)
GFR: 57.96 mL/min — AB (ref >60.00)
Glucose, Bld: 97 mg/dL (ref 70–99)
POTASSIUM: 4.1 meq/L (ref 3.5–5.1)
Sodium: 138 mEq/L (ref 135–145)
Total Bilirubin: 0.4 mg/dL (ref 0.2–1.2)
Total Protein: 7.7 g/dL (ref 6.0–8.3)

## 2018-03-01 LAB — PROTIME-INR
INR: 1 ratio (ref 0.8–1.0)
PROTHROMBIN TIME: 12.1 s (ref 9.6–13.1)

## 2018-03-01 LAB — FERRITIN: Ferritin: 11.5 ng/mL (ref 10.0–291.0)

## 2018-03-01 LAB — FOLATE: Folate: 11.8 ng/mL (ref >5.9)

## 2018-03-01 LAB — VITAMIN B12: Vitamin B-12: 213 pg/mL (ref 211–911)

## 2018-03-01 MED ORDER — PANTOPRAZOLE SODIUM 40 MG PO TBEC: 40.0000 mg | DELAYED_RELEASE_TABLET | Freq: Every day | ORAL | 3 refills | Status: DC

## 2018-03-01 NOTE — Progress Notes (Signed)
Sherry Wang    [id number]    [birth year]  Primary Care Physician:Penner, Olin Hauser, MD  Referring Physician: Greig Right, MD [address] [place],  [address]  Chief complaint: Primary biliary cirrhosis, GERD, substernal chest pain  HPI: 77 year old female with history of primary biliary cirrhosis here for follow-up visit. Last seen in office March 2018. She was started on Nintedanib for pulmonary fibrosis and is doing overall well .  Diagnosed with obstructive sleep apnea, did not tolerate CPAP but after she was informed by her pulmonologist how severe her sleep apnea was patient is willing to try it again.  Complains of intermittent heartburn and she is mostly concerned about substernal chest pain which comes on with exertion.  Pulmonary symptoms are stable.  Denies any symptoms or chest pain at rest she is scheduled to see her primary care physician and undergo cardiac stress test.  She is currently not on PPI, taking Zantac as needed.  Denies any dysphagia, odynophagia, nausea, vomiting, abdominal pain, melena or bright red blood per rectum.  No weight loss or loss of appetite.  EGD February 02, 2017: No esophageal varices, portal hypertensive gastropathy mild otherwise normal exam.  Colonoscopy by Dr. Melina Copa November 08, 2008 was normal, was recommended follow-up colonoscopy in 10 years for colorectal cancer screening  Outpatient Encounter Medications as of 03/01/2018  Medication Sig  . amitriptyline (ELAVIL) 10 MG tablet Take 10-20 mg by mouth at bedtime.   . Azelastine HCl 0.15 % SOLN Place 2 sprays into the nose 2 (two) times daily.  . budesonide-formoterol (SYMBICORT) 160-4.5 MCG/ACT inhaler Inhale 2 puffs into the lungs every evening.   . calcium carbonate (TUMS - DOSED IN MG ELEMENTAL CALCIUM) 500 MG chewable tablet Chew 1 tablet by mouth daily.  . Cholecalciferol (VITAMIN D3) 2000 UNITS TABS Take 1 tablet by mouth daily.  . cyanocobalamin 1000 MCG  tablet Take 1,000 mcg by mouth every evening.  . fluticasone (FLONASE) 50 MCG/ACT nasal spray Place 2 sprays into both nostrils every evening.   Marland Kitchen ipratropium-albuterol (DUONEB) 0.5-2.5 (3) MG/3ML SOLN Take 3 mLs by nebulization every 6 (six) hours as needed.  . montelukast (SINGULAIR) 10 MG tablet Take 10 mg by mouth daily.  . Multiple Vitamin (MULTIVITAMIN) capsule Take 1 capsule by mouth daily before supper.   . Nintedanib (OFEV) 150 MG CAPS Take 150 mg by mouth 2 (two) times daily.  . ranitidine (ZANTAC) 150 MG tablet TAKE 1 TABLET BY MOUTH TWICE DAILY  . sertraline (ZOLOFT) 100 MG tablet Take 200 mg by mouth at bedtime. Pt takes 2 tablets, by mouth, every day  . spironolactone (ALDACTONE) 25 MG tablet TAKE 1 TABLET BY MOUTH ONCE DAILY  . [DISCONTINUED] ursodiol (ACTIGALL) 250 MG tablet TAKE 2 TABLETS BY MOUTH ONCE DAILY  . nitroGLYCERIN (NITROSTAT) 0.4 MG SL tablet Place 0.4 mg under the tongue every 5 (five) minutes as needed for chest pain.   . pantoprazole (PROTONIX) 40 MG tablet Take 1 tablet (40 mg total) by mouth daily.  . [DISCONTINUED] esomeprazole (NEXIUM) 40 MG capsule Take 1 capsule (40 mg total) by mouth daily before breakfast. (Patient taking differently: Take 40 mg by mouth every evening. )  . [DISCONTINUED] zolpidem (AMBIEN) 5 MG tablet Take 5 mg by mouth at bedtime as needed for sleep.   No facility-administered encounter medications on file as of 03/01/2018.     Allergies as of 03/01/2018 - Review Complete 09/29/2017  Allergen Reaction  Noted  . Paxil [paroxetine hcl] Itching 08/06/2014  . Tamiflu [oseltamivir phosphate]  08/06/2014    Past Medical History:  Diagnosis Date  . Anemia   . Anemia, iron deficiency 11/28/2014  . Anxiety   . Asthma   . AVM (arteriovenous malformation) of small bowel, acquired ([place])   . Biliary cirrhosis ([place])   . Depression   . Family history of adverse reaction to anesthesia    sister had hysteria after anesthesia  . Gallstones   .  GERD (gastroesophageal reflux disease)   . GI bleeding   . Headache   . History of home oxygen therapy    2 liters at hs only  . Hyperlipemia   . Pneumonia    last 2016  . 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 Pulmonary fibrosis ([place])   . Tingling    left foot sing back surgery     Past Surgical History:  Procedure Laterality Date  . CHOLECYSTECTOMY    . ESOPHAGOGASTRODUODENOSCOPY (EGD) WITH PROPOFOL N/A 02/02/2017   Procedure: ESOPHAGOGASTRODUODENOSCOPY (EGD) WITH PROPOFOL;  Surgeon: Mauri Pole, MD;  Location: [facility];  Service: Endoscopy;  Laterality: N/A;  . LAMINECTOMY  age 57  . SHOULDER SURGERY Left   . TONSILLECTOMY     as a child    Family History  Problem Relation Age of Onset  . Asthma Father   . Leukemia Father        died age 5  . Cervical cancer Paternal Grandmother   . Alzheimer's disease Mother   . Heart disease Maternal Uncle        x2  . Colon polyps Paternal Aunt   . Stroke Maternal Aunt   . Colon cancer Neg Hx   . Esophageal cancer Neg Hx   . Gallbladder disease Neg Hx     Social History   Socioeconomic History  . Marital status: Widowed    Spouse name: Not on file  . Number of children: 1  . Years of education: Not on file  . Highest education level: Not on file  Occupational History  . Occupation: Retired Aeronautical engineer  . Financial resource strain: Not on file  . Food insecurity:    Worry: Not on file    Inability: Not on file  . Transportation needs:    Medical: Not on file    Non-medical: Not on file  Tobacco Use  . Smoking status: Former Smoker    Packs/day: 2.00    Years: 20.00    Pack years: 40.00    Types: Cigarettes    Last attempt to quit: 08/18/1983    Years since quitting: 34.5  . Smokeless tobacco: Never Used  Substance and Sexual Activity  . Alcohol use: No    Alcohol/week: 0.0 oz  . Drug use: No  . Sexual activity: Not on file  Lifestyle  . Physical activity:    Days per week: Not on  file    Minutes per session: Not on file  . Stress: Not on file  Relationships  . Social connections:    Talks on phone: Not on file    Gets together: Not on file    Attends religious service: Not on file    Active member of club or organization: Not on file    Attends meetings of clubs or organizations: Not on file    Relationship status: Not on file  . Intimate partner violence:    Fear of current or ex partner:  Not on file    Emotionally abused: Not on file    Physically abused: Not on file    Forced sexual activity: Not on file  Other Topics Concern  . Not on file  Social History Narrative  . Not on file      Review of systems: Review of Systems  Constitutional: Negative for fever and chills.  HENT: Negative.   Eyes: Negative for blurred vision.  Respiratory: Negative for cough, shortness of breath and wheezing.   Cardiovascular: Negative for  palpitations. Positive for chest pain. Gastrointestinal: as per HPI Genitourinary: Negative for dysuria, urgency, frequency and hematuria.  Musculoskeletal: Negative for myalgias, back pain and joint pain.  Skin: Negative for itching and rash.  Neurological: Negative for dizziness, tremors, focal weakness, seizures and loss of consciousness.  Endo/Heme/Allergies: Positive for seasonal allergies.  Psychiatric/Behavioral: Negative for depression, suicidal ideas and hallucinations.  All other systems reviewed and are negative.   Physical Exam: There were no vitals filed for this visit. There is no height or weight on file to calculate BMI. Gen:      No acute distress HEENT:  EOMI, sclera anicteric Neck:     No masses; no thyromegaly Lungs:    Clear to auscultation bilaterally; normal respiratory effort CV:         Regular rate and rhythm; no murmurs Abd:      + bowel sounds; soft, non-tender; no palpable masses, no distension Ext:    No edema; adequate peripheral perfusion Skin:      Warm and dry; no rash Neuro: alert and  oriented x 3 Psych: normal mood and affect  Data Reviewed:  Reviewed labs, radiology imaging, old records and pertinent past GI work up   Assessment and Plan/Recommendations:  77 year old female with primary biliary cirrhosis and chronic GERD here for follow-up visit PBC: Stable symptoms Follow-up CBC, CMP, B12, folate, PT/INR and ferritin  Due for [facility] screening: Schedule for abdominal ultrasound and AFP AFP slightly elevated at 23 May 2016 and abdominal ultrasound negative for any liver lesions November 2017  EGD June 2018- for esophageal or gastric varices  Colorectal cancer screening average risk due March 2020  GERD: Has breakthrough heartburn on Zantac daily Advised patient to start Protonix 40 mg daily, 30 minutes before breakfast and continue Zantac at bedtime as needed Discussed antireflux measures  Substernal atypical chest pain, worse with exertion cannot exclude angina Advised patient to follow-up with PMD and cardiac stress test to be scheduled through PMD  Return in 6 months or sooner if needed  Greater than 50% of the time used for counseling as well as treatment plan and follow-up. She had multiple questions which were answered to her satisfaction  K. Denzil Magnuson , MD [phone]    CC: Greig Right, MD

## 2018-03-01 NOTE — Patient Instructions (Signed)
Go to the basement for labs today  You have been scheduled for an abdominal ultrasound at [facility] (1st floor of hospital) on 7/23 at 9am. Please arrive 15 minutes prior to your appointment for registration. Make certain not to have anything to eat or drink after midnight prior to your appointment. Should you need to reschedule your appointment, please contact radiology at [phone]. This test typically takes about 30 minutes to perform.  We will send protonix to your pharmacy  Continue Zantac daily at bedtime  If you are age 34 or older, your body mass index should be between 23-30. Your Body mass index is 24.87 kg/m. If this is out of the aforementioned range listed, please consider follow up with your Primary Care Provider.  If you are age 48 or younger, your body mass index should be between 19-25. Your Body mass index is 24.87 kg/m. If this is out of the aformentioned range listed, please consider follow up with your Primary Care Provider.

## 2018-03-02 ENCOUNTER — Other Ambulatory Visit: Payer: Self-pay | Admitting: Gastroenterology

## 2018-03-02 LAB — AFP TUMOR MARKER: AFP-Tumor Marker: 6.6 ng/mL — ABNORMAL HIGH

## 2018-03-03 ENCOUNTER — Other Ambulatory Visit: Payer: Self-pay

## 2018-03-03 ENCOUNTER — Encounter: Payer: Self-pay | Admitting: Gastroenterology

## 2018-03-03 DIAGNOSIS — D508 Other iron deficiency anemias: Secondary | ICD-10-CM

## 2018-03-03 DIAGNOSIS — K7469 Other cirrhosis of liver: Secondary | ICD-10-CM

## 2018-03-03 DIAGNOSIS — E538 Deficiency of other specified B group vitamins: Secondary | ICD-10-CM

## 2018-03-07 DIAGNOSIS — I499 Cardiac arrhythmia, unspecified: Secondary | ICD-10-CM | POA: Diagnosis not present

## 2018-03-07 DIAGNOSIS — I950 Idiopathic hypotension: Secondary | ICD-10-CM | POA: Diagnosis not present

## 2018-03-07 DIAGNOSIS — R072 Precordial pain: Secondary | ICD-10-CM | POA: Diagnosis not present

## 2018-03-08 ENCOUNTER — Ambulatory Visit (HOSPITAL_COMMUNITY)
Admission: RE | Admit: 2018-03-08 | Discharge: 2018-03-08 | Disposition: A | Discharge status: Home / Self Care | Payer: Medicare Other | Source: Ambulatory Visit | Attending: Gastroenterology | Admitting: Gastroenterology

## 2018-03-08 DIAGNOSIS — K219 Gastro-esophageal reflux disease without esophagitis: Secondary | ICD-10-CM

## 2018-03-08 DIAGNOSIS — Z9049 Acquired absence of other specified parts of digestive tract: Secondary | ICD-10-CM | POA: Diagnosis not present

## 2018-03-08 DIAGNOSIS — K743 Primary biliary cirrhosis: Secondary | ICD-10-CM | POA: Diagnosis not present

## 2018-03-08 NOTE — Progress Notes (Signed)
Cardiology Office Note:    Date:  03/09/2018   ID:  Sherry Wang, DOB March 21, 1941, MRN [id number]  PCP:  Greig Right, MD  Cardiologist:  Shirlee More, MD   Referring MD: Greig Right, MD  ASSESSMENT:    1. Precordial chest pain   2. Junctional bradycardia   3. Obstructive sleep apnea    PLAN:    In order of problems listed above:  1. Her presentation sounds like stable exertional angina she has had a normal myocardial perfusion study in the last year and for further evaluation undergo cardiac CTA to confirm the presence of CAD and to guide severity especially fractional flow reserve.  She would be a high risk patient for revascularization however she does not have portal hypertension and her platelets and coagulation are normal. 2. She has had episodes of lightheadedness with cautery and brief junctional rhythm will employ a 14-day extended Holter monitor to evaluate her for bradycardia 3. 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  Concerns regarding medicines are outlined above.  Orders Placed This Encounter  Procedures  . CT CORONARY MORPH W/CTA COR W/SCORE W/CA W/CM &/OR WO/CM  . CT CORONARY FRACTIONAL FLOW RESERVE DATA PREP  . CT CORONARY FRACTIONAL FLOW RESERVE FLUID ANALYSIS  . LONG TERM MONITOR (3-14 DAYS)  . EKG 12-Lead   Meds ordered this encounter  Medications  . metoprolol tartrate (LOPRESSOR) 50 MG tablet    Sig: Take 1 tablet (50 mg total) by mouth once for 1 dose.    Dispense:  1 tablet    Refill:  0     Chief Complaint  Patient presents with  . New Patient (Initial Visit)  . Chest Pain    History of Present Illness:    Sherry Wang is a 77 y.o. female with biliary cirrhosis and pulmonary fibrosiswho is being  seen today for the evaluation of chest pain at the request of Greig Right, MD.   She was last [facility] at [facility] cardiology 11/26/2016.  She has a history of esophagitis possible Barrett's esophagus biliary cirrhosis asthma and idiopathic pulmonary fibrosis who was evaluated for chest pain she has a history of myocardial infarction had normal coronary arteriography in 2004 and a normal myocardial perfusion study EF 69% 07/06/2013.  She had both echocardiogram and myocardial perfusion study performed after that visit 12/15/2016 but I cannot visualize the reports in the epic system.  Her pulmonary fibrosis is improved and her biliary cirrhosis is stable.  She is having a pattern of more frequent exertional chest pain rating through to the back with physical effort relieved with rest and the episodes now happening daily.  She has had previous normal myocardial perfusion study.  She tells me that her reflux is under control and this is clearly different no associated dyspepsia or heartburn not pleuritic in nature no associated nausea or diaphoresis and the chest pain is moderate in intensity.  Reviewed options for further evaluation I do not think a repeat myocardial perfusion study would be helpful she had one 1 year ago and on has not refer directly to coronary angiography with her cirrhosis and increased risk of bleeding complications from PCI and stent and will pursue cardiac CTA to define the presence of CAD severity and guide treatment.  She has had  episodes of lightheadedness have bradycardia with a brief junctional rhythm on her last EKG but all feel comfortable putting her on a beta-blocker she is intolerant of nitroglycerin with headache oral nitrates are not a good choice I had like to avoid calcium channel blockers with her liver disease.  Her symptoms have not become unstable.  She has no known history of heart disease  Past Medical History:  Diagnosis Date  . Anemia   . Anemia, iron  deficiency 11/28/2014  . Anxiety   . Asthma   . AVM (arteriovenous malformation) of small bowel, acquired   . Biliary cirrhosis ([place])   . Cirrhosis ([place])   . Depression   . Family history of adverse reaction to anesthesia    sister had hysteria after anesthesia  . Fibromyalgia   . Gallstones   . GERD (gastroesophageal reflux disease)   . GI bleeding   . Headache   . History of home oxygen therapy    2 liters at hs only  . Hyperlipemia   . Non-Q wave non-ST elevation myocardial infarction (NSTEMI) ([place]) 2004  . Osteoporosis   . Pneumonia    last 2016  . Primary biliary cirrhosis ([place]) 11/28/2014  . Pulmonary fibrosis ([place])   . Sleep apnea    has had sleep study  . Tingling    left foot sing back surgery     Past Surgical History:  Procedure Laterality Date  . CARDIAC CATHETERIZATION  2004  . CHOLECYSTECTOMY    . COLONOSCOPY  11/08/2008  . ESOPHAGOGASTRODUODENOSCOPY (EGD) WITH PROPOFOL N/A 02/02/2017   Procedure: ESOPHAGOGASTRODUODENOSCOPY (EGD) WITH PROPOFOL;  Surgeon: Mauri Pole, MD;  Location: [facility] ENDOSCOPY;  Service: Endoscopy;  Laterality: N/A;  . LAMINECTOMY  age 73  . SHOULDER SURGERY Left   . TOE SURGERY Right   . TONSILLECTOMY     as a child  . WRIST FRACTURE SURGERY Left     Current Medications: Current Meds  Medication Sig  . acetaminophen (TYLENOL) 325 MG tablet Take 650 mg by mouth every 6 (six) hours as needed.  Marland Kitchen albuterol (PROVENTIL HFA;VENTOLIN HFA) 108 (90 Base) MCG/ACT inhaler Inhale into the lungs every 6 (six) hours as needed for wheezing or shortness of breath.  Marland Kitchen alendronate (FOSAMAX) 70 MG tablet Take 70 mg by mouth once a week. Take with a full glass of water on an empty stomach.  Marland Kitchen amitriptyline (ELAVIL) 10 MG tablet Take 10-20 mg by mouth at bedtime.   Marland Kitchen azelastine (ASTELIN) 0.1 % nasal spray Place 2 sprays into both nostrils 2 (two) times daily.  . budesonide-formoterol (SYMBICORT) 160-4.5 MCG/ACT inhaler Inhale 2 puffs into the lungs  2 (two) times daily.   . calcium carbonate (TUMS - DOSED IN MG ELEMENTAL CALCIUM) 500 MG chewable tablet Chew 1 tablet by mouth daily.  . Cholecalciferol (VITAMIN D) 2000 units tablet Take 1 tablet by mouth daily.  . cyanocobalamin 1000 MCG tablet Take 1,000 mcg by mouth every evening.  . fluticasone (FLONASE) 50 MCG/ACT nasal spray Place 2 sprays into both nostrils every evening.   Marland Kitchen ipratropium-albuterol (DUONEB) 0.5-2.5 (3) MG/3ML SOLN Take 3 mLs by nebulization every 6 (six) hours as needed.  . montelukast (SINGULAIR) 10 MG tablet Take 10 mg by mouth daily.  . Multiple Vitamin (MULTIVITAMIN) capsule Take 1 capsule by mouth daily before supper.   . Nintedanib (OFEV) 150 MG CAPS Take 150 mg by mouth 2 (two) times daily.  . ondansetron (ZOFRAN) 4 MG tablet Take 4 mg by mouth  every 8 (eight) hours as needed for nausea or vomiting.  . ranitidine (ZANTAC) 150 MG tablet TAKE 1 TABLET BY MOUTH TWICE DAILY (Patient taking differently: TAKE 1 TABLET BY MOUTH DAILY)  . rosuvastatin (CRESTOR) 20 MG tablet Take 20 mg by mouth daily.  . sertraline (ZOLOFT) 100 MG tablet Take 200 mg by mouth at bedtime. Pt takes 2 tablets, by mouth, every day  . spironolactone (ALDACTONE) 25 MG tablet TAKE 1 TABLET BY MOUTH ONCE DAILY  . ursodiol (ACTIGALL) 250 MG tablet TAKE 2 TABLETS BY MOUTH ONCE DAILY     Allergies:   Paxil [paroxetine hcl]; Tamiflu [oseltamivir phosphate]; and Oseltamivir phosphate   Social History   Socioeconomic History  . Marital status: Widowed    Spouse name: Not on file  . Number of children: 1  . Years of education: Not on file  . Highest education level: Not on file  Occupational History  . Occupation: Retired Aeronautical engineer  . Financial resource strain: Not on file  . Food insecurity:    Worry: Not on file    Inability: Not on file  . Transportation needs:    Medical: Not on file    Non-medical: Not on file  Tobacco Use  . Smoking status: Former Smoker    Packs/day: 2.00      Years: 20.00    Pack years: 40.00    Types: Cigarettes    Last attempt to quit: 08/18/1983    Years since quitting: 34.5  . Smokeless tobacco: Never Used  Substance and Sexual Activity  . Alcohol use: No    Alcohol/week: 0.0 oz  . Drug use: No  . Sexual activity: Not on file  Lifestyle  . Physical activity:    Days per week: Not on file    Minutes per session: Not on file  . Stress: Not on file  Relationships  . Social connections:    Talks on phone: Not on file    Gets together: Not on file    Attends religious service: Not on file    Active member of club or organization: Not on file    Attends meetings of clubs or organizations: Not on file    Relationship status: Not on file  Other Topics Concern  . Not on file  Social History Narrative  . Not on file     Family History: The patient's family history includes Alzheimer's disease in her mother; Asthma in her father; Cervical cancer in her paternal grandmother; Colon polyps in her paternal aunt; Diabetes type II in her father and sister; Heart disease in her maternal uncle; Hypertension in her brother and mother; Leukemia in her father; Obesity in her sister; Sleep apnea in her sister; Stroke in her maternal aunt and sister. There is no history of Colon cancer, Esophageal cancer, or Gallbladder disease.  ROS:   Review of Systems  Constitution: Positive for weight loss.   Please see the history of present illness.    All other systems reviewed and are negative.  EKGs/Labs/Other Studies Reviewed:    The following studies were reviewed today:  EKG 03/07/18: [place] , APC's, alternates with junctional at 40-45 BPM  EKG:  EKG is  ordered today.  The ekg ordered today demonstrates sinus rhythm and is normal  Recent Labs: 03/01/2018: ALT 14; BUN 14; Creatinine, Ser 0.99; Hemoglobin 13.0; Platelets 236.0; Potassium 4.1; Sodium 138  Recent Lipid Panel No results found for: CHOL, TRIG, HDL, CHOLHDL, VLDL, LDLCALC,  LDLDIRECT  Physical Exam:    VS:  BP 106/68 (BP Location: Left Arm, Patient Position: Sitting, Cuff Size: Normal)   Pulse 72   Ht 5\' 3"  (1.6 m)   Wt 144 lb (65.3 kg)   SpO2 98%   BMI 25.51 kg/m     Wt Readings from Last 3 Encounters:  03/09/18 144 lb (65.3 kg)  03/01/18 140 lb 6.4 oz (63.7 kg)  02/02/17 170 lb (77.1 kg)     GEN: She does not appear chronically ill or debilitated there is no icterus well nourished, well developed in no acute distress HEENT: Normal NECK: No JVD; No carotid bruits LYMPHATICS: No lymphadenopathy CARDIAC: RRR, no murmurs, rubs, gallops RESPIRATORY:  Clear to auscultation without rales, wheezing or rhonchi  ABDOMEN: Soft, non-tender, non-distended MUSCULOSKELETAL:  No edema; No deformity  SKIN: Warm and dry NEUROLOGIC:  Alert and oriented x 3 PSYCHIATRIC:  Normal affect     Signed, Shirlee More, MD  03/09/2018 11:50 AM    [place]

## 2018-03-09 ENCOUNTER — Encounter: Payer: Self-pay | Admitting: *Deleted

## 2018-03-09 ENCOUNTER — Encounter: Payer: Self-pay | Admitting: Cardiology

## 2018-03-09 ENCOUNTER — Ambulatory Visit (INDEPENDENT_AMBULATORY_CARE_PROVIDER_SITE_OTHER): Payer: Medicare Other | Admitting: Cardiology

## 2018-03-09 VITALS — BP 106/68 | HR 72 | Ht 63.0 in | Wt 144.0 lb

## 2018-03-09 DIAGNOSIS — R001 Bradycardia, unspecified: Secondary | ICD-10-CM | POA: Diagnosis not present

## 2018-03-09 DIAGNOSIS — R072 Precordial pain: Secondary | ICD-10-CM | POA: Diagnosis not present

## 2018-03-09 DIAGNOSIS — G4733 Obstructive sleep apnea (adult) (pediatric): Secondary | ICD-10-CM | POA: Insufficient documentation

## 2018-03-09 DIAGNOSIS — I959 Hypotension, unspecified: Secondary | ICD-10-CM | POA: Insufficient documentation

## 2018-03-09 MED ORDER — METOPROLOL TARTRATE 50 MG PO TABS: 50.0000 mg | ORAL_TABLET | Freq: Once | ORAL | 0 refills | Status: DC

## 2018-03-09 NOTE — Patient Instructions (Addendum)
Medication Instructions:  Your physician recommends that you continue on your current medications as directed. Please refer to the Current Medication list given to you today.    Labwork: None.  Testing/Procedures: Your physician has recommended that you wear a holter monitor. Holter monitors are medical devices that record the heart's electrical activity. Doctors most often use these monitors to diagnose arrhythmias. Arrhythmias are problems with the speed or rhythm of the heartbeat. The monitor is a small, portable device. You can wear one while you do your normal daily activities. This is usually used to diagnose what is causing palpitations/syncope (passing out). Wear for 14 days.  Your physician has requested that you have cardiac CT. Cardiac computed tomography (CT) is a painless test that uses an x-ray machine to take clear, detailed pictures of your heart. For further information please visit [employer]. Please follow instruction sheet as given.   Please arrive at the [facility] main entrance of [facility] at xx:xx AM (30-45 minutes prior to test start time)  [facility] [place], [place] [address] [phone]  Proceed to the [facility] Radiology Department (First Floor).  Please follow these instructions carefully (unless otherwise directed):  On the Night Before the Test: . Drink plenty of water. . Do not consume any caffeinated/decaffeinated beverages or chocolate 12 hours prior to your test. . Do not take any antihistamines 12 hours prior to your test.    On the Day of the Test: . Drink plenty of water. Do not drink any water within one hour of the test. . Do not eat any food 4 hours prior to the test. . You may take your regular medications prior to the test. . IF NOT ON A BETA BLOCKER - Take 50 mg of lopressor (metoprolol) one hour before the test.   After the Test: . Drink plenty of water. . After receiving IV  contrast, you may experience a mild flushed feeling. This is normal. . On occasion, you may experience a mild rash up to 24 hours after the test. This is not dangerous. If this occurs, you can take Benadryl 25 mg and increase your fluid intake. . If you experience trouble breathing, this can be serious. If it is severe call 911 IMMEDIATELY. If it is mild, please call our office.    Follow-Up: Your physician recommends that you schedule a follow-up appointment in: 6 weeks.   Any Other Special Instructions Will Be Listed Below (If Applicable).     If you need a refill on your cardiac medications before your next appointment, please call your pharmacy.   Holter Monitoring A Holter monitor is a small device that is used to detect abnormal heart rhythms. It clips to your clothing and is connected by wires to flat, sticky disks (electrodes) that attach to your chest. It is worn continuously for 24-48 hours. Follow these instructions at home:  Wear your Holter monitor at all times, even while exercising and sleeping, for as long as directed by your health care provider.  Make sure that the Holter monitor is safely clipped to your clothing or close to your body as recommended by your health care provider.  Do not get the monitor or wires wet.  Do not put body lotion or moisturizer on your chest.  Keep your skin clean.  Keep a diary of your daily activities, such as walking and doing chores. If you feel that your heartbeat is abnormal or that your heart is fluttering or  skipping a beat: ? Record what you are doing when it happens. ? Record what time of day the symptoms occur.  Return your Holter monitor as directed by your health care provider.  Keep all follow-up visits as directed by your health care provider. This is important. Get help right away if:  You feel lightheaded or you faint.  You have trouble breathing.  You feel pain in your chest, upper arm, or jaw.  You feel  sick to your stomach and your skin is pale, cool, or damp.  You heartbeat feels unusual or abnormal. This information is not intended to replace advice given to you by your health care provider. Make sure you discuss any questions you have with your health care provider. Document Released: 05/01/2004 Document Revised: 01/09/2016 Document Reviewed: 03/12/2014 Elsevier Interactive Patient Education  2018 [employer].    Cardiac CT Angiogram A cardiac CT angiogram is a procedure to look at the heart and the area around the heart. It may be done to help find the cause of chest pains or other symptoms of heart disease. During this procedure, a large X-ray machine, called a CT scanner, takes detailed pictures of the heart and the surrounding area after a dye (contrast material) has been injected into blood vessels in the area. The procedure is also sometimes called a coronary CT angiogram, coronary artery scanning, or CTA. A cardiac CT angiogram allows the health care provider to see how well blood is flowing to and from the heart. The health care provider will be able to see if there are any problems, such as:  Blockage or narrowing of the coronary arteries in the heart.  Fluid around the heart.  Signs of weakness or disease in the muscles, valves, and tissues of the heart.  Tell a health care provider about:  Any allergies you have. This is especially important if you have had a previous allergic reaction to contrast dye.  All medicines you are taking, including vitamins, herbs, eye drops, creams, and over-the-counter medicines.  Any blood disorders you have.  Any surgeries you have had.  Any medical conditions you have.  Whether you are pregnant or may be pregnant.  Any anxiety disorders, chronic pain, or other conditions you have that may increase your stress or prevent you from lying still. What are the risks? Generally, this is a safe procedure. However, problems may occur,  including:  Bleeding.  Infection.  Allergic reactions to medicines or dyes.  Damage to other structures or organs.  Kidney damage from the dye or contrast that is used.  Increased risk of cancer from radiation exposure. This risk is low. Talk with your health care provider about: ? The risks and benefits of testing. ? How you can receive the lowest dose of radiation.  What happens before the procedure?  Wear comfortable clothing and remove any jewelry, glasses, dentures, and hearing aids.  Follow instructions from your health care provider about eating and drinking. This may include: ? For 12 hours before the test - avoid caffeine. This includes tea, coffee, soda, energy drinks, and diet pills. Drink plenty of water or other fluids that do not have caffeine in them. Being well-hydrated can prevent complications. ? For 4-6 hours before the test - stop eating and drinking. The contrast dye can cause nausea, but this is less likely if your stomach is empty.  Ask your health care provider about changing or stopping your regular medicines. This is especially important if you are taking diabetes  medicines, blood thinners, or medicines to treat erectile dysfunction. What happens during the procedure?  Hair on your chest may need to be removed so that small sticky patches called electrodes can be placed on your chest. These will transmit information that helps to monitor your heart during the test.  An IV tube will be inserted into one of your veins.  You might be given a medicine to control your heart rate during the test. This will help to ensure that good images are obtained.  You will be asked to lie on an exam table. This table will slide in and out of the CT machine during the procedure.  Contrast dye will be injected into the IV tube. You might feel warm, or you may get a metallic taste in your mouth.  You will be given a medicine (nitroglycerin) to relax (dilate) the arteries in  your heart.  The table that you are lying on will move into the CT machine tunnel for the scan.  The person running the machine will give you instructions while the scans are being done. You may be asked to: ? Keep your arms above your head. ? Hold your breath. ? Stay very still, even if the table is moving.  When the scanning is complete, you will be moved out of the machine.  The IV tube will be removed. The procedure may vary among health care providers and hospitals. What happens after the procedure?  You might feel warm, or you may get a metallic taste in your mouth from the contrast dye.  You may have a headache from the nitroglycerin.  After the procedure, drink water or other fluids to wash (flush) the contrast material out of your body.  Contact a health care provider if you have any symptoms of allergy to the contrast. These symptoms include: ? Shortness of breath. ? Rash or hives. ? A racing heartbeat.  Most people can return to their normal activities right after the procedure. Ask your health care provider what activities are safe for you.  It is up to you to get the results of your procedure. Ask your health care provider, or the department that is doing the procedure, when your results will be ready. Summary  A cardiac CT angiogram is a procedure to look at the heart and the area around the heart. It may be done to help find the cause of chest pains or other symptoms of heart disease.  During this procedure, a large X-ray machine, called a CT scanner, takes detailed pictures of the heart and the surrounding area after a dye (contrast material) has been injected into blood vessels in the area.  Ask your health care provider about changing or stopping your regular medicines before the procedure. This is especially important if you are taking diabetes medicines, blood thinners, or medicines to treat erectile dysfunction.  After the procedure, drink water or other  fluids to wash (flush) the contrast material out of your body. This information is not intended to replace advice given to you by your health care provider. Make sure you discuss any questions you have with your health care provider. Document Released: 07/16/2008 Document Revised: 06/22/2016 Document Reviewed: 06/22/2016 Elsevier Interactive Patient Education  2017 [employer].  Metoprolol tablets What is this medicine? METOPROLOL (me TOE proe lole) is a beta-blocker. Beta-blockers reduce the workload on the heart and help it to beat more regularly. This medicine is used to treat high blood pressure and to prevent chest pain.  It is also used to after a heart attack and to prevent an additional heart attack from occurring. This medicine may be used for other purposes; ask your health care provider or pharmacist if you have questions. COMMON BRAND NAME(S): Lopressor What should I tell my health care provider before I take this medicine? They need to know if you have any of these conditions: -diabetes -heart or vessel disease like slow heart rate, worsening heart failure, heart block, sick sinus syndrome or Raynaud's disease -kidney disease -liver disease -lung or breathing disease, like asthma or emphysema -pheochromocytoma -thyroid disease -an unusual or allergic reaction to metoprolol, other beta-blockers, medicines, foods, dyes, or preservatives -pregnant or trying to get pregnant -breast-feeding How should I use this medicine? Take this medicine by mouth with a drink of water. Follow the directions on the prescription label. Take this medicine immediately after meals. Take your doses at regular intervals. Do not take more medicine than directed. Do not stop taking this medicine suddenly. This could lead to serious heart-related effects. Talk to your pediatrician regarding the use of this medicine in children. Special care may be needed. Overdosage: If you think you have taken too much of  this medicine contact a poison control center or emergency room at once. NOTE: This medicine is only for you. Do not share this medicine with others. What if I miss a dose? If you miss a dose, take it as soon as you can. If it is almost time for your next dose, take only that dose. Do not take double or extra doses. What may interact with this medicine? This medicine may interact with the following medications: -certain medicines for blood pressure, heart disease, irregular heart beat -certain medicines for depression like monoamine oxidase (MAO) inhibitors, fluoxetine, or paroxetine -clonidine -dobutamine -epinephrine -isoproterenol -reserpine This list may not describe all possible interactions. Give your health care provider a list of all the medicines, herbs, non-prescription drugs, or dietary supplements you use. Also tell them if you smoke, drink alcohol, or use illegal drugs. Some items may interact with your medicine. What should I watch for while using this medicine? Visit your doctor or health care professional for regular check ups. Contact your doctor right away if your symptoms worsen. Check your blood pressure and pulse rate regularly. Ask your health care professional what your blood pressure and pulse rate should be, and when you should contact them. You may get drowsy or dizzy. Do not drive, use machinery, or do anything that needs mental alertness until you know how this medicine affects you. Do not sit or stand up quickly, especially if you are an older patient. This reduces the risk of dizzy or fainting spells. Contact your doctor if these symptoms continue. Alcohol may interfere with the effect of this medicine. Avoid alcoholic drinks. What side effects may I notice from receiving this medicine? Side effects that you should report to your doctor or health care professional as soon as possible: -allergic reactions like skin rash, itching or hives -cold or numb hands or  feet -depression -difficulty breathing -faint -fever with sore throat -irregular heartbeat, chest pain -rapid weight gain -swollen legs or ankles Side effects that usually do not require medical attention (report to your doctor or health care professional if they continue or are bothersome): -anxiety or nervousness -change in sex drive or performance -dry skin -headache -nightmares or trouble sleeping -short term memory loss -stomach upset or diarrhea -unusually tired This list may not describe all possible side effects.  Call your doctor for medical advice about side effects. You may report side effects to FDA at 1-800-FDA-1088. Where should I keep my medicine? Keep out of the reach of children. Store at room temperature between 15 and 30 degrees C (59 and 86 degrees F). Throw away any unused medicine after the expiration date. NOTE: This sheet is a summary. It may not cover all possible information. If you have questions about this medicine, talk to your doctor, pharmacist, or health care provider.  2018 Elsevier/Gold Standard (2013-04-07 14:40:36)

## 2018-03-14 ENCOUNTER — Ambulatory Visit: Payer: Medicare Other

## 2018-04-02 ENCOUNTER — Other Ambulatory Visit: Payer: Self-pay | Admitting: Gastroenterology

## 2018-04-06 ENCOUNTER — Telehealth: Payer: Self-pay | Admitting: Emergency Medicine

## 2018-04-06 DIAGNOSIS — R072 Precordial pain: Secondary | ICD-10-CM

## 2018-04-06 NOTE — Telephone Encounter (Signed)
Patient advised to have labs drawn on 04/20/18 before ct. Patient verbally understands.

## 2018-04-20 DIAGNOSIS — R072 Precordial pain: Secondary | ICD-10-CM | POA: Diagnosis not present

## 2018-04-21 LAB — BASIC METABOLIC PANEL
BUN / CREAT RATIO: 15 (ref 12–28)
BUN: 15 mg/dL (ref 8–27)
CO2: 22 mmol/L (ref 20–29)
CREATININE: 0.98 mg/dL (ref 0.57–1.00)
Calcium: 10.2 mg/dL (ref 8.7–10.3)
Chloride: 105 mmol/L (ref 96–106)
GFR, EST AFRICAN AMERICAN: 65 mL/min/{1.73_m2} (ref >59)
GFR, EST NON AFRICAN AMERICAN: 56 mL/min/{1.73_m2} — AB (ref >59)
Glucose: 82 mg/dL (ref 65–99)
POTASSIUM: 4.1 mmol/L (ref 3.5–5.2)
SODIUM: 142 mmol/L (ref 134–144)

## 2018-04-25 ENCOUNTER — Ambulatory Visit (HOSPITAL_COMMUNITY): Admission: RE | Admit: 2018-04-25 | Payer: Medicare Other | Source: Ambulatory Visit

## 2018-05-31 DIAGNOSIS — J84112 Idiopathic pulmonary fibrosis: Secondary | ICD-10-CM | POA: Diagnosis not present

## 2018-05-31 DIAGNOSIS — G2581 Restless legs syndrome: Secondary | ICD-10-CM | POA: Diagnosis not present

## 2018-05-31 DIAGNOSIS — J310 Chronic rhinitis: Secondary | ICD-10-CM | POA: Diagnosis not present

## 2018-05-31 DIAGNOSIS — J4520 Mild intermittent asthma, uncomplicated: Secondary | ICD-10-CM | POA: Diagnosis not present

## 2018-05-31 DIAGNOSIS — G4733 Obstructive sleep apnea (adult) (pediatric): Secondary | ICD-10-CM | POA: Diagnosis not present

## 2018-06-02 ENCOUNTER — Ambulatory Visit (HOSPITAL_COMMUNITY)
Admission: RE | Admit: 2018-06-02 | Discharge: 2018-06-02 | Disposition: A | Discharge status: Home / Self Care | Payer: Medicare Other | Source: Ambulatory Visit | Attending: Cardiology | Admitting: Cardiology

## 2018-06-02 ENCOUNTER — Encounter (HOSPITAL_COMMUNITY): Payer: Self-pay

## 2018-06-02 ENCOUNTER — Ambulatory Visit (HOSPITAL_COMMUNITY): Payer: Medicare Other

## 2018-06-02 DIAGNOSIS — R072 Precordial pain: Secondary | ICD-10-CM

## 2018-06-02 MED ORDER — METOPROLOL TARTRATE 5 MG/5ML IV SOLN
5.0000 mg | INTRAVENOUS | Status: DC
  Filled 2018-06-02: qty 5

## 2018-06-02 MED ORDER — NITROGLYCERIN 0.4 MG SL SUBL
0.8000 mg | SUBLINGUAL_TABLET | Freq: Once | SUBLINGUAL | Status: DC
  Filled 2018-06-02: qty 25

## 2018-06-10 ENCOUNTER — Telehealth: Payer: Self-pay | Admitting: *Deleted

## 2018-06-10 MED ORDER — PANTOPRAZOLE SODIUM 40 MG PO TBEC: 40.0000 mg | DELAYED_RELEASE_TABLET | Freq: Every day | ORAL | 3 refills | Status: DC

## 2018-06-10 NOTE — Telephone Encounter (Signed)
Pantoprazole sent to [facility] per fax request per pharmacy

## 2018-06-23 DIAGNOSIS — R531 Weakness: Secondary | ICD-10-CM | POA: Diagnosis not present

## 2018-06-23 DIAGNOSIS — R1013 Epigastric pain: Secondary | ICD-10-CM | POA: Diagnosis not present

## 2018-06-23 DIAGNOSIS — R0789 Other chest pain: Secondary | ICD-10-CM | POA: Diagnosis not present

## 2018-06-23 DIAGNOSIS — K2900 Acute gastritis without bleeding: Secondary | ICD-10-CM | POA: Diagnosis not present

## 2018-06-23 DIAGNOSIS — R079 Chest pain, unspecified: Secondary | ICD-10-CM | POA: Diagnosis not present

## 2018-06-23 DIAGNOSIS — R42 Dizziness and giddiness: Secondary | ICD-10-CM | POA: Diagnosis not present

## 2018-06-23 DIAGNOSIS — R072 Precordial pain: Secondary | ICD-10-CM | POA: Diagnosis not present

## 2018-06-27 ENCOUNTER — Telehealth: Payer: Self-pay

## 2018-06-27 NOTE — Telephone Encounter (Signed)
-----   Message from Mattie Marlin, RN sent at 06/27/2018 12:32 PM EST -----   ----- Message ----- From: Richardo Priest, MD Sent: 06/27/2018  12:29 PM EST To: Mattie Marlin, RN  Should see me in office ----- Message ----- From: Mattie Marlin, RN Sent: 06/27/2018  11:30 AM EST To: Richardo Priest, MD  ?? Please review ----- Message ----- From: Jenean Lindau, MD Sent: 06/27/2018  10:59 AM EST To: Mattie Marlin, RN  Reviewed per chart.  I am not sure why this came to me.  Patient sees Dr. Monday ----- Message ----- From: Mattie Marlin, RN Sent: 06/22/2018  10:59 AM EST To: Jenean Lindau, MD  Has not had stress test since 12/15/2016.  ----- Message ----- From: Jenean Lindau, MD Sent: 06/22/2018  10:27 AM EST To: Mattie Marlin, RN  Dr. Vic Ripper notes is the patient has had a normal MPI which is myocardial perfusion imaging.  Kindly investigate ----- Message ----- From: Mattie Marlin, RN Sent: 06/22/2018  10:00 AM EST To: Jenean Lindau, MD    ----- Message ----- From: Jenean Lindau, MD Sent: 06/22/2018   9:08 AM EST To: Mattie Marlin, RN  When did she have stress test? Results? ----- Message ----- From: Mattie Marlin, RN Sent: 06/21/2018   8:15 AM EST To: Jenean Lindau, MD  Please advise. ----- Message ----- From: Richardo Priest, MD Sent: 06/02/2018   4:21 PM EST To: Mattie Marlin, RN  Ask Dr R what he wants to do when he returns, has had a normal MPI ----- Message ----- From: Josue Hector, MD Sent: 06/02/2018   2:26 PM EDT To: Richardo Priest, MD  Cardiac CT cancelled HR way too variable with frequent PACl HR;s varied from junctional 40 to sinus at 95 bpm Suggest f/u myovue

## 2018-06-27 NOTE — Telephone Encounter (Signed)
Patient aware of appointment with Dr Bettina Gavia on 06-28-18 to discuss further testing.  Patient agrees to plan and verbalized understanding.

## 2018-06-28 ENCOUNTER — Ambulatory Visit: Payer: Medicare Other | Admitting: Cardiology

## 2018-06-28 NOTE — Progress Notes (Deleted)
Cardiology Office Note:    Date:  06/28/2018   ID:  Sherry Wang, DOB Jul 18, 1941, MRN [id number]  PCP:  Greig Right, MD  Cardiologist:  Shirlee More, MD    Referring MD: Greig Right, MD    ASSESSMENT:    No diagnosis found. PLAN:    In order of problems listed above:  1. ***   Next appointment: ***   Medication Adjustments/Labs and Tests Ordered: Current medicines are reviewed at length with the patient today.  Concerns regarding medicines are outlined above.  No orders of the defined types were placed in this encounter.  No orders of the defined types were placed in this encounter.   No chief complaint on file.   History of Present Illness:    Sherry Wang is a 77 y.o. female with a hx of biliary cirrhosis and pulmonary fibrosis who was  last seen 03/09/18.  She is previously seen at [facility] cardiology.  She has a background history of esophagitis possible Barrett's esophagus asthma biliary cirrhosis and idiopathic pulmonary fibrosis she has a history of myocardial infarction with normal coronary arteriography in 2004 and has had normal coronary studies including echocardiogram and myocardial perfusion study performed in 2018.  When she was seen by me in the office she was having stable exertional angina and was referred for cardiac CTA.  Her EKG shows sinus arrhythmia and she had known to much heart rate variability to allow for gating of her images.  Compliance with diet, lifestyle and medications: *** Past Medical History:  Diagnosis Date  . Anemia   . Anemia, iron deficiency 11/28/2014  . Anxiety   . Asthma   . AVM (arteriovenous malformation) of small bowel, acquired   . Biliary cirrhosis ([place])   . Cirrhosis ([place])   . Depression   . Family history of adverse reaction to anesthesia    sister had hysteria after anesthesia  . Fibromyalgia   . Gallstones   . GERD (gastroesophageal reflux disease)   . GI bleeding   . Headache   . History of home  oxygen therapy    2 liters at hs only  . Hyperlipemia   . Non-Q wave non-ST elevation myocardial infarction (NSTEMI) ([place]) 2004  . Osteoporosis   . Pneumonia    last 2016  . Primary biliary cirrhosis ([place]) 11/28/2014  . Pulmonary fibrosis ([place])   . Sleep apnea    has had sleep study  . Tingling    left foot sing back surgery     Past Surgical History:  Procedure Laterality Date  . CARDIAC CATHETERIZATION  2004  . CHOLECYSTECTOMY    . COLONOSCOPY  11/08/2008  . ESOPHAGOGASTRODUODENOSCOPY (EGD) WITH PROPOFOL N/A 02/02/2017   Procedure: ESOPHAGOGASTRODUODENOSCOPY (EGD) WITH PROPOFOL;  Surgeon: Mauri Pole, MD;  Location: [facility] ENDOSCOPY;  Service: Endoscopy;  Laterality: N/A;  . LAMINECTOMY  age 58  . SHOULDER SURGERY Left   . TOE SURGERY Right   . TONSILLECTOMY     as a child  . WRIST FRACTURE SURGERY Left     Current Medications: No outpatient medications have been marked as taking for the 06/28/18 encounter (Appointment) with Richardo Priest, MD.     Allergies:   Paxil [paroxetine hcl]; Tamiflu [oseltamivir phosphate]; and Oseltamivir phosphate   Social History   Socioeconomic History  . Marital status: Widowed    Spouse name: Not on file  . Number of children: 1  . Years of education: Not on file  . Highest education level:  Not on file  Occupational History  . Occupation: Retired Aeronautical engineer  . Financial resource strain: Not on file  . Food insecurity:    Worry: Not on file    Inability: Not on file  . Transportation needs:    Medical: Not on file    Non-medical: Not on file  Tobacco Use  . Smoking status: Former Smoker    Packs/day: 2.00    Years: 20.00    Pack years: 40.00    Types: Cigarettes    Last attempt to quit: 08/18/1983    Years since quitting: 34.8  . Smokeless tobacco: Never Used  Substance and Sexual Activity  . Alcohol use: No    Alcohol/week: 0.0 standard drinks  . Drug use: No  . Sexual activity: Not on file  Lifestyle  .  Physical activity:    Days per week: Not on file    Minutes per session: Not on file  . Stress: Not on file  Relationships  . Social connections:    Talks on phone: Not on file    Gets together: Not on file    Attends religious service: Not on file    Active member of club or organization: Not on file    Attends meetings of clubs or organizations: Not on file    Relationship status: Not on file  Other Topics Concern  . Not on file  Social History Narrative  . Not on file     Family History: The patient's ***family history includes Alzheimer's disease in her mother; Asthma in her father; Cervical cancer in her paternal grandmother; Colon polyps in her paternal aunt; Diabetes type II in her father and sister; Heart disease in her maternal uncle; Hypertension in her brother and mother; Leukemia in her father; Obesity in her sister; Sleep apnea in her sister; Stroke in her maternal aunt and sister. There is no history of Colon cancer, Esophageal cancer, or Gallbladder disease. ROS:   Please see the history of present illness.    All other systems reviewed and are negative.  EKGs/Labs/Other Studies Reviewed:    The following studies were reviewed today:  EKG:  EKG ordered today.  The ekg ordered today demonstrates ***  Recent Labs: 03/01/2018: ALT 14; Hemoglobin 13.0; Platelets 236.0 04/20/2018: BUN 15; Creatinine, Ser 0.98; Potassium 4.1; Sodium 142  Recent Lipid Panel No results found for: CHOL, TRIG, HDL, CHOLHDL, VLDL, LDLCALC, LDLDIRECT  Physical Exam:    VS:  There were no vitals taken for this visit.    Wt Readings from Last 3 Encounters:  03/09/18 144 lb (65.3 kg)  03/01/18 140 lb 6.4 oz (63.7 kg)  02/02/17 170 lb (77.1 kg)     GEN: *** Well nourished, well developed in no acute distress HEENT: Normal NECK: No JVD; No carotid bruits LYMPHATICS: No lymphadenopathy CARDIAC: ***RRR, no murmurs, rubs, gallops RESPIRATORY:  Clear to auscultation without rales, wheezing  or rhonchi  ABDOMEN: Soft, non-tender, non-distended MUSCULOSKELETAL:  No edema; No deformity  SKIN: Warm and dry NEUROLOGIC:  Alert and oriented x 3 PSYCHIATRIC:  Normal affect    Signed, Shirlee More, MD  06/28/2018 7:58 AM    [place]

## 2018-08-01 DIAGNOSIS — K219 Gastro-esophageal reflux disease without esophagitis: Secondary | ICD-10-CM | POA: Diagnosis not present

## 2018-08-01 NOTE — Progress Notes (Signed)
Cardiology Office Note:    Date:  08/03/2018   ID:  Sherry Wang, DOB 07-29-1941, MRN [id number]  PCP:  Greig Right, MD  Cardiologist:  Shirlee More, MD    Referring MD: Greig Right, MD    ASSESSMENT:    1. Precordial chest pain   2. Junctional bradycardia   3. 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 Primary biliary cirrhosis (HCC)    PLAN:    In order of problems listed above:  1. Atypical she has had normal invasive and noninvasive testing and neither of [place] feel compelled to repeat coronary angiography at this time. 2. Asymptomatic checks 2-week ambulatory ZIO monitor for significant bradycardia 3. Stable managed by pulmonary 4. Stable managed by GI   Next appointment: 6 months or sooner if abnormality on her heart rhythm monitor.   Medication Adjustments/Labs and Tests Ordered: Current medicines are reviewed at length with the patient today.  Concerns regarding medicines are outlined above.  Orders Placed This Encounter  Procedures  . LONG TERM MONITOR (3-14 DAYS)   Meds ordered this encounter  Medications  . spironolactone (ALDACTONE) 25 MG tablet    Sig: Take 1 tablet (25 mg total) by mouth daily as needed.    Dispense:  30 tablet    Refill:  3    Please consider 90 day supplies to promote better adherence    Chief Complaint  Patient presents with  . Chest Pain  . Hypotension  . Bradycardia    History of Present Illness:    Sherry Wang is a 77 y.o. female with a hx of pulmonary fibrosis, biliary cirrhosis Barrett's esophagus chest pain normal coronary arteriography in 2004 normal myocardial perfusion study 2014 last seen 03/09/2018 with chest pain.  Cardiac CTA was ordered but not performed because of heart rate variability. Compliance with diet, lifestyle and medications: Yes She tells me she has a [facility] emergency room 06/23/2018 with hypotension and bradycardia although she was asymptomatic she  is a very intelligent woman in the emergency room records she is she was there for chest pain.  In the emergency room there was no bradycardia heart rate 62 and no hypotension blood pressure is 150/70 testing was performed including a normal CBC normal BMP troponin was normal she had normal liver function which is known with biliary cirrhosis EKG was performed twice which was normal and she was discharged from the hospital.  She takes spironolactone for edema associated liver disease she has not had any recently and takes a try cyclic antidepressant that can cause orthostatic hypotension.  We decided today should only take the diuretic as needed and after further episodes would need to stop her T CAD that she takes for sleep.  She has intermittent episodes of nonexertional nonanginal chest pain unfortunately cannot have a cardiac CTA with heart rate variability and neither of [place] feel that she needs to repeat a coronary angiogram at this time.  For further evaluation of her bradycardia and previous junctional rhythm she agrees to wear 2 weeks extended ZIO monitor to assess for significant arrhythmia. Past Medical History:  Diagnosis Date  . Anemia   . Anemia, iron deficiency 11/28/2014  . Anxiety   . Asthma   . AVM (arteriovenous malformation) of small bowel, acquired   . Biliary cirrhosis ([place])   . Cirrhosis ([place])   . Depression   . Family history of adverse reaction to anesthesia    sister had hysteria after anesthesia  .  Fibromyalgia   . Gallstones   . GERD (gastroesophageal reflux disease)   . GI bleeding   . Headache   . History of home oxygen therapy    2 liters at hs only  . Hyperlipemia   . Non-Q wave non-ST elevation myocardial infarction (NSTEMI) ([place]) 2004  . Osteoporosis   . Pneumonia    last 2016  . Primary biliary cirrhosis ([place]) 11/28/2014  . Pulmonary fibrosis ([place])   . Sleep apnea    has had sleep study  . Tingling    left foot sing back surgery     Past Surgical  History:  Procedure Laterality Date  . CARDIAC CATHETERIZATION  2004  . CHOLECYSTECTOMY    . COLONOSCOPY  11/08/2008  . ESOPHAGOGASTRODUODENOSCOPY (EGD) WITH PROPOFOL N/A 02/02/2017   Procedure: ESOPHAGOGASTRODUODENOSCOPY (EGD) WITH PROPOFOL;  Surgeon: Mauri Pole, MD;  Location: [facility];  Service: Endoscopy;  Laterality: N/A;  . LAMINECTOMY  age 89  . SHOULDER SURGERY Left   . TOE SURGERY Right   . TONSILLECTOMY     as a child  . WRIST FRACTURE SURGERY Left     Current Medications: Current Meds  Medication Sig  . acetaminophen (TYLENOL) 325 MG tablet Take 650 mg by mouth every 6 (six) hours as needed.  Marland Kitchen albuterol (PROVENTIL HFA;VENTOLIN HFA) 108 (90 Base) MCG/ACT inhaler Inhale into the lungs every 6 (six) hours as needed for wheezing or shortness of breath.  Marland Kitchen amitriptyline (ELAVIL) 10 MG tablet Take 10-20 mg by mouth at bedtime.   Marland Kitchen azelastine (ASTELIN) 0.1 % nasal spray Place 2 sprays into both nostrils 2 (two) times daily.  . budesonide-formoterol (SYMBICORT) 160-4.5 MCG/ACT inhaler Inhale 2 puffs into the lungs 2 (two) times daily.   . calcium carbonate (TUMS - DOSED IN MG ELEMENTAL CALCIUM) 500 MG chewable tablet Chew 1 tablet by mouth daily.  . Cholecalciferol (VITAMIN D) 2000 units tablet Take 1 tablet by mouth daily.  . cyanocobalamin 1000 MCG tablet Take 1,000 mcg by mouth every evening.  . dicyclomine (BENTYL) 10 MG capsule Take 10 mg by mouth daily.  . fluticasone (FLONASE) 50 MCG/ACT nasal spray Place 2 sprays into both nostrils every evening.   Marland Kitchen ipratropium-albuterol (DUONEB) 0.5-2.5 (3) MG/3ML SOLN Take 3 mLs by nebulization every 6 (six) hours as needed.  . montelukast (SINGULAIR) 10 MG tablet Take 10 mg by mouth daily.  . Multiple Vitamin (MULTIVITAMIN) capsule Take 1 capsule by mouth daily before supper.   . Nintedanib (OFEV) 150 MG CAPS Take 150 mg by mouth 2 (two) times daily.  . nitroGLYCERIN (NITROSTAT) 0.4 MG SL tablet Place 0.4 mg under the  tongue every 5 (five) minutes as needed for chest pain.   Marland Kitchen omeprazole (PRILOSEC) 20 MG capsule Take 20 mg by mouth daily.  . pantoprazole (PROTONIX) 40 MG tablet Take 1 tablet (40 mg total) by mouth daily.  . rosuvastatin (CRESTOR) 20 MG tablet Take 20 mg by mouth daily.  . sertraline (ZOLOFT) 100 MG tablet Take 200 mg by mouth at bedtime. Pt takes 2 tablets, by mouth, every day  . spironolactone (ALDACTONE) 25 MG tablet Take 1 tablet (25 mg total) by mouth daily as needed.  . [DISCONTINUED] spironolactone (ALDACTONE) 25 MG tablet TAKE 1 TABLET BY MOUTH ONCE DAILY     Allergies:   Paxil [paroxetine hcl]; Tamiflu [oseltamivir phosphate]; and Oseltamivir phosphate   Social History   Socioeconomic History  . Marital status: Widowed    Spouse name: Not on file  .  Number of children: 1  . Years of education: Not on file  . Highest education level: Not on file  Occupational History  . Occupation: Retired Aeronautical engineer  . Financial resource strain: Not on file  . Food insecurity:    Worry: Not on file    Inability: Not on file  . Transportation needs:    Medical: Not on file    Non-medical: Not on file  Tobacco Use  . Smoking status: Former Smoker    Packs/day: 2.00    Years: 20.00    Pack years: 40.00    Types: Cigarettes    Last attempt to quit: 08/18/1983    Years since quitting: 34.9  . Smokeless tobacco: Never Used  Substance and Sexual Activity  . Alcohol use: No    Alcohol/week: 0.0 standard drinks  . Drug use: No  . Sexual activity: Not on file  Lifestyle  . Physical activity:    Days per week: Not on file    Minutes per session: Not on file  . Stress: Not on file  Relationships  . Social connections:    Talks on phone: Not on file    Gets together: Not on file    Attends religious service: Not on file    Active member of club or organization: Not on file    Attends meetings of clubs or organizations: Not on file    Relationship status: Not on file  Other  Topics Concern  . Not on file  Social History Narrative  . Not on file     Family History: The patient's family history includes Alzheimer's disease in her mother; Asthma in her father; Cervical cancer in her paternal grandmother; Colon polyps in her paternal aunt; Diabetes type II in her father and sister; Heart disease in her maternal uncle; Hypertension in her brother and mother; Leukemia in her father; Obesity in her sister; Sleep apnea in her sister; Stroke in her maternal aunt and sister. There is no history of Colon cancer, Esophageal cancer, or Gallbladder disease. ROS:   Please see the history of present illness.    All other systems reviewed and are negative.  EKGs/Labs/Other Studies Reviewed:    The following studies were reviewed today:  EKG: Done at [facility] ED that I independently reviewed but shows sinus rhythm and normal  Recent Labs: 03/01/2018: ALT 14; Hemoglobin 13.0; Platelets 236.0 04/20/2018: BUN 15; Creatinine, Ser 0.98; Potassium 4.1; Sodium 142  Recent Lipid Panel No results found for: CHOL, TRIG, HDL, CHOLHDL, VLDL, LDLCALC, LDLDIRECT  Physical Exam:    VS:  BP 114/64 (BP Location: Right Arm, Patient Position: Sitting, Cuff Size: Normal)   Pulse 84   Ht 5\' 3"  (1.6 m)   Wt 150 lb 4 oz (68.2 kg)   SpO2 97%   BMI 26.62 kg/m     Wt Readings from Last 3 Encounters:  08/03/18 150 lb 4 oz (68.2 kg)  03/09/18 144 lb (65.3 kg)  03/01/18 140 lb 6.4 oz (63.7 kg)     GEN:  Well nourished, well developed in no acute distress HEENT: Normal NECK: No JVD; No carotid bruits LYMPHATICS: No lymphadenopathy CARDIAC: RRR, no murmurs, rubs, gallops RESPIRATORY:  Clear to auscultation without rales, wheezing or rhonchi  ABDOMEN: Soft, non-tender, non-distended MUSCULOSKELETAL:  No edema; No deformity  SKIN: Warm and dry NEUROLOGIC:  Alert and oriented x 3 PSYCHIATRIC:  Normal affect    Signed, Shirlee More, MD  08/03/2018 1:33 PM  [facility]

## 2018-08-03 ENCOUNTER — Encounter: Payer: Self-pay | Admitting: Cardiology

## 2018-08-03 ENCOUNTER — Ambulatory Visit (INDEPENDENT_AMBULATORY_CARE_PROVIDER_SITE_OTHER): Payer: Medicare Other | Admitting: Cardiology

## 2018-08-03 VITALS — BP 114/64 | HR 84 | Ht 63.0 in | Wt 150.2 lb

## 2018-08-03 DIAGNOSIS — R072 Precordial pain: Secondary | ICD-10-CM | POA: Diagnosis not present

## 2018-08-03 DIAGNOSIS — R001 Bradycardia, unspecified: Secondary | ICD-10-CM

## 2018-08-03 DIAGNOSIS — J8410 Pulmonary fibrosis, unspecified: Secondary | ICD-10-CM

## 2018-08-03 DIAGNOSIS — K743 Primary biliary cirrhosis: Secondary | ICD-10-CM | POA: Diagnosis not present

## 2018-08-03 MED ORDER — SPIRONOLACTONE 25 MG PO TABS: 25.0000 mg | ORAL_TABLET | Freq: Every day | ORAL | 3 refills | Status: DC | PRN

## 2018-08-03 NOTE — Patient Instructions (Addendum)
Medication Instructions:  Your physician has recommended you make the following change in your medication:   Change: the way you are taking spironolactone to 25 mg daily as needed.   If you need a refill on your cardiac medications before your next appointment, please call your pharmacy.   Lab work: None.  If you have labs (blood work) drawn today and your tests are completely normal, you will receive your results only by: Marland Kitchen MyChart Message (if you have MyChart) OR . A paper copy in the mail If you have any lab test that is abnormal or we need to change your treatment, we will call you to review the results.  Testing/Procedures: Your physician has recommended that you wear a holter monitor. Holter monitors are medical devices that record the heart's electrical activity. Doctors most often use these monitors to diagnose arrhythmias. Arrhythmias are problems with the speed or rhythm of the heartbeat. The monitor is a small, portable device. You can wear one while you do your normal daily activities. This is usually used to diagnose what is causing palpitations/syncope (passing out). Wear for 14 days.     Follow-Up: At [facility], you and your health needs are our priority.  As part of our continuing mission to provide you with exceptional heart care, we have created designated Provider Care Teams.  These Care Teams include your primary Cardiologist (physician) and Advanced Practice Providers (APPs -  Physician Assistants and Nurse Practitioners) who all work together to provide you with the care you need, when you need it. You will need a follow up appointment in 6 months.  Please call our office 2 months in advance to schedule this appointment.  You may see No primary care provider on file. or another member of our [employer] Provider Team in [place]: Jenne Campus, MD . Jyl Heinz, MD  Any Other Special Instructions Will Be Listed Below (If Applicable).   Holter  Monitoring A Holter monitor is a small device that is used to detect abnormal heart rhythms. It clips to your clothing and is connected by wires to flat, sticky disks (electrodes) that attach to your chest. It is worn continuously for 24-48 hours. Follow these instructions at home:  Wear your Holter monitor at all times, even while exercising and sleeping, for as long as directed by your health care provider.  Make sure that the Holter monitor is safely clipped to your clothing or close to your body as recommended by your health care provider.  Do not get the monitor or wires wet.  Do not put body lotion or moisturizer on your chest.  Keep your skin clean.  Keep a diary of your daily activities, such as walking and doing chores. If you feel that your heartbeat is abnormal or that your heart is fluttering or skipping a beat: ? Record what you are doing when it happens. ? Record what time of day the symptoms occur.  Return your Holter monitor as directed by your health care provider.  Keep all follow-up visits as directed by your health care provider. This is important. Get help right away if:  You feel lightheaded or you faint.  You have trouble breathing.  You feel pain in your chest, upper arm, or jaw.  You feel sick to your stomach and your skin is pale, cool, or damp.  You heartbeat feels unusual or abnormal. This information is not intended to replace advice given to you by your health care provider. Make sure you discuss  any questions you have with your health care provider. Document Released: 05/01/2004 Document Revised: 01/09/2016 Document Reviewed: 03/12/2014 Elsevier Interactive Patient Education  [employer].

## 2018-08-08 ENCOUNTER — Other Ambulatory Visit: Payer: Self-pay | Admitting: Cardiology

## 2018-08-08 DIAGNOSIS — R001 Bradycardia, unspecified: Secondary | ICD-10-CM

## 2018-08-08 DIAGNOSIS — R072 Precordial pain: Secondary | ICD-10-CM

## 2018-08-22 ENCOUNTER — Ambulatory Visit (INDEPENDENT_AMBULATORY_CARE_PROVIDER_SITE_OTHER): Payer: Medicare Other

## 2018-08-22 DIAGNOSIS — R001 Bradycardia, unspecified: Secondary | ICD-10-CM | POA: Diagnosis not present

## 2018-08-22 DIAGNOSIS — R072 Precordial pain: Secondary | ICD-10-CM

## 2018-08-31 ENCOUNTER — Other Ambulatory Visit: Payer: Self-pay

## 2018-09-09 DIAGNOSIS — R001 Bradycardia, unspecified: Secondary | ICD-10-CM | POA: Diagnosis not present

## 2018-09-28 DIAGNOSIS — G4733 Obstructive sleep apnea (adult) (pediatric): Secondary | ICD-10-CM | POA: Diagnosis not present

## 2018-09-28 DIAGNOSIS — K219 Gastro-esophageal reflux disease without esophagitis: Secondary | ICD-10-CM | POA: Diagnosis not present

## 2018-09-28 DIAGNOSIS — J84113 Idiopathic non-specific interstitial pneumonitis: Secondary | ICD-10-CM | POA: Diagnosis not present

## 2018-09-28 DIAGNOSIS — Z1211 Encounter for screening for malignant neoplasm of colon: Secondary | ICD-10-CM | POA: Diagnosis not present

## 2018-09-28 DIAGNOSIS — Z6828 Body mass index (BMI) 28.0-28.9, adult: Secondary | ICD-10-CM | POA: Diagnosis not present

## 2018-09-28 DIAGNOSIS — Z23 Encounter for immunization: Secondary | ICD-10-CM | POA: Diagnosis not present

## 2018-09-28 DIAGNOSIS — Z79899 Other long term (current) drug therapy: Secondary | ICD-10-CM | POA: Diagnosis not present

## 2018-09-28 DIAGNOSIS — D649 Anemia, unspecified: Secondary | ICD-10-CM | POA: Diagnosis not present

## 2018-09-28 DIAGNOSIS — E7800 Pure hypercholesterolemia, unspecified: Secondary | ICD-10-CM | POA: Diagnosis not present

## 2018-09-28 DIAGNOSIS — E663 Overweight: Secondary | ICD-10-CM | POA: Diagnosis not present

## 2018-09-28 DIAGNOSIS — Z0000 Encounter for general adult medical examination without abnormal findings: Secondary | ICD-10-CM | POA: Diagnosis not present

## 2018-09-28 DIAGNOSIS — M810 Age-related osteoporosis without current pathological fracture: Secondary | ICD-10-CM | POA: Diagnosis not present

## 2018-09-30 ENCOUNTER — Encounter: Payer: Self-pay | Admitting: Gastroenterology

## 2018-10-05 ENCOUNTER — Other Ambulatory Visit (INDEPENDENT_AMBULATORY_CARE_PROVIDER_SITE_OTHER): Payer: Medicare Other

## 2018-10-05 DIAGNOSIS — D508 Other iron deficiency anemias: Secondary | ICD-10-CM

## 2018-10-05 DIAGNOSIS — E538 Deficiency of other specified B group vitamins: Secondary | ICD-10-CM

## 2018-10-05 DIAGNOSIS — K7469 Other cirrhosis of liver: Secondary | ICD-10-CM

## 2018-10-05 LAB — FERRITIN: FERRITIN: 10.6 ng/mL (ref 10.0–291.0)

## 2018-10-05 LAB — VITAMIN B12: Vitamin B-12: >1525 pg/mL — ABNORMAL HIGH (ref 211–911)

## 2018-10-05 LAB — FOLATE: Folate: 9 ng/mL (ref >5.9)

## 2018-10-21 ENCOUNTER — Telehealth: Payer: Self-pay | Admitting: *Deleted

## 2018-10-21 NOTE — Telephone Encounter (Signed)
What day do you have for hospital in April?

## 2018-10-21 NOTE — Telephone Encounter (Signed)
I called and spoke with the patient. She states she dose use Oxygen at night at home. She will need colonoscopy at hospital per [place] protocol. Patient is aware. Thank you, Robbin pv

## 2018-10-21 NOTE — Telephone Encounter (Signed)
Schedule office visit, next available. Thanks

## 2018-10-21 NOTE — Telephone Encounter (Signed)
Spoke with the patient and scheduled her. Also cancelled the pre-visit and [place] appointments.

## 2018-11-15 ENCOUNTER — Encounter: Payer: Medicare Other | Admitting: Gastroenterology

## 2018-11-16 ENCOUNTER — Telehealth: Payer: Self-pay | Admitting: *Deleted

## 2018-11-16 NOTE — Telephone Encounter (Signed)
Patient wants to reschedule appointment after COVID19 is not interested in doing [facility]

## 2018-11-22 DIAGNOSIS — G2581 Restless legs syndrome: Secondary | ICD-10-CM | POA: Diagnosis not present

## 2018-11-22 DIAGNOSIS — G4733 Obstructive sleep apnea (adult) (pediatric): Secondary | ICD-10-CM | POA: Diagnosis not present

## 2018-11-22 DIAGNOSIS — J4520 Mild intermittent asthma, uncomplicated: Secondary | ICD-10-CM | POA: Diagnosis not present

## 2018-11-22 DIAGNOSIS — J84112 Idiopathic pulmonary fibrosis: Secondary | ICD-10-CM | POA: Diagnosis not present

## 2018-11-22 DIAGNOSIS — J310 Chronic rhinitis: Secondary | ICD-10-CM | POA: Diagnosis not present

## 2018-11-23 ENCOUNTER — Ambulatory Visit: Payer: Medicare Other | Admitting: Gastroenterology

## 2018-12-08 DIAGNOSIS — I209 Angina pectoris, unspecified: Secondary | ICD-10-CM | POA: Diagnosis not present

## 2018-12-08 DIAGNOSIS — R0602 Shortness of breath: Secondary | ICD-10-CM | POA: Diagnosis not present

## 2018-12-09 DIAGNOSIS — R0602 Shortness of breath: Secondary | ICD-10-CM | POA: Diagnosis not present

## 2018-12-15 DIAGNOSIS — M79602 Pain in left arm: Secondary | ICD-10-CM | POA: Diagnosis not present

## 2018-12-15 DIAGNOSIS — J8410 Pulmonary fibrosis, unspecified: Secondary | ICD-10-CM | POA: Diagnosis not present

## 2018-12-15 DIAGNOSIS — I10 Essential (primary) hypertension: Secondary | ICD-10-CM | POA: Diagnosis not present

## 2018-12-15 DIAGNOSIS — R0789 Other chest pain: Secondary | ICD-10-CM | POA: Diagnosis not present

## 2018-12-15 DIAGNOSIS — M542 Cervicalgia: Secondary | ICD-10-CM | POA: Diagnosis not present

## 2018-12-15 DIAGNOSIS — Z79899 Other long term (current) drug therapy: Secondary | ICD-10-CM | POA: Diagnosis not present

## 2018-12-15 DIAGNOSIS — R079 Chest pain, unspecified: Secondary | ICD-10-CM | POA: Diagnosis not present

## 2018-12-15 DIAGNOSIS — I5181 Takotsubo syndrome: Secondary | ICD-10-CM | POA: Diagnosis not present

## 2018-12-15 DIAGNOSIS — I252 Old myocardial infarction: Secondary | ICD-10-CM | POA: Diagnosis not present

## 2018-12-15 DIAGNOSIS — Z9981 Dependence on supplemental oxygen: Secondary | ICD-10-CM | POA: Diagnosis not present

## 2018-12-15 DIAGNOSIS — R002 Palpitations: Secondary | ICD-10-CM | POA: Diagnosis not present

## 2018-12-15 DIAGNOSIS — R0602 Shortness of breath: Secondary | ICD-10-CM | POA: Diagnosis not present

## 2018-12-15 DIAGNOSIS — R918 Other nonspecific abnormal finding of lung field: Secondary | ICD-10-CM | POA: Diagnosis not present

## 2018-12-16 DIAGNOSIS — I5181 Takotsubo syndrome: Secondary | ICD-10-CM | POA: Diagnosis not present

## 2018-12-16 DIAGNOSIS — I10 Essential (primary) hypertension: Secondary | ICD-10-CM | POA: Diagnosis not present

## 2018-12-16 DIAGNOSIS — R079 Chest pain, unspecified: Secondary | ICD-10-CM | POA: Diagnosis not present

## 2018-12-16 DIAGNOSIS — R0602 Shortness of breath: Secondary | ICD-10-CM | POA: Diagnosis not present

## 2018-12-26 DIAGNOSIS — Z6828 Body mass index (BMI) 28.0-28.9, adult: Secondary | ICD-10-CM | POA: Diagnosis not present

## 2018-12-26 DIAGNOSIS — R0600 Dyspnea, unspecified: Secondary | ICD-10-CM | POA: Diagnosis not present

## 2018-12-26 DIAGNOSIS — E663 Overweight: Secondary | ICD-10-CM | POA: Diagnosis not present

## 2018-12-26 DIAGNOSIS — R072 Precordial pain: Secondary | ICD-10-CM | POA: Diagnosis not present

## 2018-12-26 DIAGNOSIS — K219 Gastro-esophageal reflux disease without esophagitis: Secondary | ICD-10-CM | POA: Diagnosis not present

## 2019-01-13 ENCOUNTER — Ambulatory Visit: Payer: Medicare Other | Admitting: Gastroenterology

## 2019-01-18 ENCOUNTER — Telehealth: Payer: Self-pay | Admitting: *Deleted

## 2019-01-18 NOTE — Telephone Encounter (Signed)
Covid-19 screening questions  Have you traveled in the last 14 days?   If yes where?  Do you now or have you had a fever in the last 14 days?  Do you have any respiratory symptoms of shortness of breath or cough now or in the last 14 days?  Do you have any family members or close contacts with diagnosed or suspected Covid-19 in the past 14 days?  Have you been tested for Covid-19 and found to be positive?

## 2019-01-19 ENCOUNTER — Ambulatory Visit (INDEPENDENT_AMBULATORY_CARE_PROVIDER_SITE_OTHER): Payer: Medicare Other | Admitting: Gastroenterology

## 2019-01-19 ENCOUNTER — Encounter: Payer: Self-pay | Admitting: Gastroenterology

## 2019-01-19 ENCOUNTER — Telehealth: Payer: Self-pay | Admitting: *Deleted

## 2019-01-19 ENCOUNTER — Other Ambulatory Visit: Payer: Self-pay

## 2019-01-19 VITALS — BP 110/58 | HR 60 | Temp 97.8°F | Ht 62.75 in | Wt 152.5 lb

## 2019-01-19 DIAGNOSIS — K7460 Unspecified cirrhosis of liver: Secondary | ICD-10-CM

## 2019-01-19 DIAGNOSIS — R0789 Other chest pain: Secondary | ICD-10-CM

## 2019-01-19 DIAGNOSIS — K219 Gastro-esophageal reflux disease without esophagitis: Secondary | ICD-10-CM | POA: Diagnosis not present

## 2019-01-19 MED ORDER — NA SULFATE-K SULFATE-MG SULF 17.5-3.13-1.6 GM/177ML PO SOLN: 1.0000 | Freq: Once | ORAL | 0 refills | Status: AC

## 2019-01-19 MED ORDER — SUCRALFATE 1 GM/10ML PO SUSP: 1.0000 g | Freq: Four times a day (QID) | ORAL | 1 refills | Status: DC

## 2019-01-19 NOTE — Progress Notes (Signed)
Sherry Wang    [id number]    01-Jul-1941  Primary Care Physician:Penner, Olin Hauser, MD  Referring Physician: Greig Right, MD [address] [place], [place] [address]   Chief complaint: Cirrhosis  HPI: 78 yr F with history of primary biliary cirrhosis, compensated, pulmonary fibrosis on home oxygen Complains of retrosternal chest pain radiating to the back intermittently and also worsening heartburn. Was recently started on Fosamax.  Taking it with water in the morning but has been laying down after taking the tablet. No dysphagia but has intermittent odynophagia  No melena or rectal bleeding.  EGD February 02, 2017 showed normal esophagus, portal hypertensive gastropathy and normal duodenum.  No varices  Colonoscopy November 08, 2008: Normal with 10-year recall  Outpatient Encounter Medications as of 01/19/2019  Medication Sig  . acetaminophen (TYLENOL) 325 MG tablet Take 650 mg by mouth every 6 (six) hours as needed.  Marland Kitchen albuterol (PROVENTIL HFA;VENTOLIN HFA) 108 (90 Base) MCG/ACT inhaler Inhale into the lungs every 6 (six) hours as needed for wheezing or shortness of breath.  Marland Kitchen alendronate (FOSAMAX) 70 MG tablet TAKE 1 TABLET BY MOUTH ONCE A WEEK IN THE MORNING AT LEAST 30 MINUTES BEFORE FIRST FOOD BEVERAGE OR MEDICATION OF THE DAY  . amitriptyline (ELAVIL) 10 MG tablet Take 10-20 mg by mouth at bedtime.   Marland Kitchen azelastine (ASTELIN) 0.1 % nasal spray Place 2 sprays into both nostrils 2 (two) times daily.  . budesonide-formoterol (SYMBICORT) 160-4.5 MCG/ACT inhaler Inhale 2 puffs into the lungs 2 (two) times daily.   . calcium carbonate (TUMS - DOSED IN MG ELEMENTAL CALCIUM) 500 MG chewable tablet Chew 1 tablet by mouth daily.  . Cholecalciferol (VITAMIN D) 2000 units tablet Take 1 tablet by mouth daily.  . cyanocobalamin 1000 MCG tablet Take 1,000 mcg by mouth every evening.  . dicyclomine (BENTYL) 10 MG capsule Take 10 mg by mouth daily.  . fluticasone (FLONASE) 50  MCG/ACT nasal spray Place 2 sprays into both nostrils every evening.   Marland Kitchen ipratropium-albuterol (DUONEB) 0.5-2.5 (3) MG/3ML SOLN Take 3 mLs by nebulization every 6 (six) hours as needed.  . Multiple Vitamin (MULTIVITAMIN) capsule Take 1 capsule by mouth daily before supper.   . Nintedanib (OFEV) 150 MG CAPS Take 150 mg by mouth 2 (two) times daily.  . nitroGLYCERIN (NITROSTAT) 0.4 MG SL tablet Place 0.4 mg under the tongue every 5 (five) minutes as needed for chest pain.   Marland Kitchen omeprazole (PRILOSEC) 20 MG capsule Take 20 mg by mouth daily.  . OXYGEN Inhale into the lungs at bedtime. 2 liters at bedtime  . rosuvastatin (CRESTOR) 20 MG tablet Take 20 mg by mouth daily.  . sertraline (ZOLOFT) 100 MG tablet Take 200 mg by mouth at bedtime. Pt takes 2 tablets, by mouth, every day  . [DISCONTINUED] montelukast (SINGULAIR) 10 MG tablet Take 10 mg by mouth daily.  . [DISCONTINUED] pantoprazole (PROTONIX) 40 MG tablet Take 1 tablet (40 mg total) by mouth daily. (Patient not taking: Reported on 01/19/2019)  . [DISCONTINUED] spironolactone (ALDACTONE) 25 MG tablet Take 1 tablet (25 mg total) by mouth daily as needed.   No facility-administered encounter medications on file as of 01/19/2019.     Allergies as of 01/19/2019 - Review Complete 01/19/2019  Allergen Reaction Noted  . Paxil [paroxetine hcl] Itching 08/06/2014  . Tamiflu [oseltamivir phosphate] Nausea And Vomiting 08/06/2014  . Oseltamivir phosphate Nausea Only 08/06/2014    Past Medical History:  Diagnosis Date  . Anemia   . Anemia, iron deficiency 11/28/2014  . Anxiety   . Asthma   . AVM (arteriovenous malformation) of small bowel, acquired   . Biliary cirrhosis ([place])   . Cirrhosis ([place])   . Depression   . Family history of adverse reaction to anesthesia    sister had hysteria after anesthesia  . Fibromyalgia   . Gallstones   . GERD (gastroesophageal reflux disease)   . GI bleeding   . Headache   . History of home oxygen therapy    2  liters at hs only  . Hyperlipemia   . Non-Q wave non-ST elevation myocardial infarction (NSTEMI) ([place]) 2004  . Osteoporosis   . Pneumonia    last 2016  . Primary biliary cirrhosis ([place]) 11/28/2014  . Pulmonary fibrosis ([place])   . Sleep apnea    has had sleep study  . Tingling    left foot sing back surgery     Past Surgical History:  Procedure Laterality Date  . CARDIAC CATHETERIZATION  2004  . CHOLECYSTECTOMY    . COLONOSCOPY  11/08/2008  . ESOPHAGOGASTRODUODENOSCOPY (EGD) WITH PROPOFOL N/A 02/02/2017   Procedure: ESOPHAGOGASTRODUODENOSCOPY (EGD) WITH PROPOFOL;  Surgeon: Mauri Pole, MD;  Location[facility];  Service: Endoscopy;  Laterality: N/A;  . LAMINECTOMY  age 78  . SHOULDER SURGERY Left   . TOE SURGERY Right   . TONSILLECTOMY     as a child  . WRIST FRACTURE SURGERY Left     Family History  Problem Relation Age of Onset  . Asthma Father   . Leukemia Father        died age 8  . Diabetes type II Father   . Cervical cancer Paternal Grandmother   . Alzheimer's disease Mother   . Hypertension Mother   . Heart disease Maternal Uncle        x2  . Colon polyps Paternal Aunt   . Stroke Maternal Aunt   . Stroke Sister   . Sleep apnea Sister   . Diabetes type II Sister   . Obesity Sister   . Hypertension Brother   . Colon cancer Neg Hx   . Esophageal cancer Neg Hx   . Gallbladder disease Neg Hx     Social History   Socioeconomic History  . Marital status: Widowed    Spouse name: Not on file  . Number of children: 1  . Years of education: Not on file  . Highest education level: Not on file  Occupational History  . Occupation: Retired Aeronautical engineer  . Financial resource strain: Not on file  . Food insecurity:    Worry: Not on file    Inability: Not on file  . Transportation needs:    Medical: Not on file    Non-medical: Not on file  Tobacco Use  . Smoking status: Former Smoker    Packs/day: 2.00    Years: 20.00    Pack years: 40.00     Types: Cigarettes    Last attempt to quit: 08/18/1983    Years since quitting: 35.4  . Smokeless tobacco: Never Used  Substance and Sexual Activity  . Alcohol use: No    Alcohol/week: 0.0 standard drinks  . Drug use: No  . Sexual activity: Not on file  Lifestyle  . Physical activity:    Days per week: Not on file    Minutes per session: Not on file  . Stress: Not on file  Relationships  . Social connections:    Talks on phone: Not on file    Gets together: Not on file    Attends religious service: Not on file    Active member of club or organization: Not on file    Attends meetings of clubs or organizations: Not on file    Relationship status: Not on file  . Intimate partner violence:    Fear of current or ex partner: Not on file    Emotionally abused: Not on file    Physically abused: Not on file    Forced sexual activity: Not on file  Other Topics Concern  . Not on file  Social History Narrative  . Not on file      Review of systems: Review of Systems  Constitutional: Negative for fever and chills. Lack of energy HENT: Positive for sinus problems Eyes: Negative for blurred vision.  Respiratory: Positive for cough, shortness of breath and wheezing.   Cardiovascular: Negative for chest pain and palpitations.  Gastrointestinal: as per HPI Genitourinary: Negative for dysuria, urgency, frequency and hematuria.  Musculoskeletal: Positive for myalgias, back pain and joint pain.  Skin: Negative for itching and rash.  Neurological: Negative for dizziness, tremors, focal weakness, seizures and loss of consciousness.  Endo/Heme/Allergies: Positive for seasonal allergies.  Psychiatric/Behavioral: Negative for depression, suicidal ideas and hallucinations.  All other systems reviewed and are negative.   Physical Exam: Vitals:   01/19/19 1012  BP: (!) 110/58  Pulse: 60  Temp: 97.8 F (36.6 C)   Body mass index is 27.23 kg/m. Gen:      No acute distress HEENT:  EOMI,  sclera anicteric Neck:     No masses; no thyromegaly Lungs:    Clear to auscultation bilaterally; normal respiratory effort CV:         Regular rate and rhythm; no murmurs Abd:      + bowel sounds; soft, non-tender; no palpable masses, no distension Ext:    No edema; adequate peripheral perfusion Skin:      Warm and dry; no rash Neuro: alert and oriented x 3 Psych: normal mood and affect  Data Reviewed:  Reviewed labs, radiology imaging, old records and pertinent past GI work up   Assessment and Plan/Recommendations:  78 year old female with primary biliary cirrhosis, compensated and pulmonary fibrosis on home oxygen Recently started on Fosamax Atypical chest pain and heartburn associated with odynophagia likely secondary to pill esophagitis She had negative cardiac work-up Continue omeprazole 20 mg daily Carafate suspension before meals and bedtime as needed  Schedule for EGD to further evaluate Also due for colorectal cancer screening, will schedule colonoscopy Due to her home oxygen requirement, will need to schedule the procedures at [facility] endoscopy unit  Due for [facility] screening: Schedule abdominal ultrasound  The risks and benefits as well as alternatives of endoscopic procedure(s) have been discussed and reviewed. All questions answered. The patient agrees to proceed.    Damaris Hippo , MD    CC: Greig Right, MD

## 2019-01-19 NOTE — Telephone Encounter (Signed)
Patient has Colon/Endo scheduled at [facility] on 02/21/2019 and will need COVID19 screening done before  Scheduled pt on 02/16/2019 for screening at [facility] drive thru testing site   Patient aware of date and time of COVID19 testing

## 2019-01-19 NOTE — Telephone Encounter (Signed)
Covid-19 screening questions  Have you traveled in the last 14 days? If yes where?  no  Do you now or have you had a fever in the last 14 days? no  Do you have any respiratory symptoms of shortness of breath or cough now or in the last 14 days? No    Do you have any family members or close contacts with diagnosed or suspected Covid-19 in the past 14 days? no  Have you been tested for Covid-19 and found to be positive? No

## 2019-01-19 NOTE — Patient Instructions (Addendum)
You have been scheduled for an abdominal ultrasound at [facility] Radiology (1st floor of hospital) on  at 01/25/2019 at [place]. Please arrive 15 minutes prior to your appointment for registration. Make certain not to have anything to eat or drink 6 hours prior to your appointment. Should you need to reschedule your appointment, please contact radiology at [phone]. This test typically takes about 30 minutes to perform.  You have been scheduled for an endoscopy and colonoscopy. Please follow the written instructions given to you at your visit today. Please pick up your prep supplies at the pharmacy within the next 1-3 days. If you use inhalers (even only as needed), please bring them with you on the day of your procedure.  [facility] 02/21/2019  At 11:15am    Follow up in 3 months   We will send Carafate to your pharmacy    Continue Omeprazole   Gastroesophageal Reflux Disease, Adult Gastroesophageal reflux (GER) happens when acid from the stomach flows up into the tube that connects the mouth and the stomach (esophagus). Normally, food travels down the esophagus and stays in the stomach to be digested. However, when a person has GER, food and stomach acid sometimes move back up into the esophagus. If this becomes a more serious problem, the person may be diagnosed with a disease called gastroesophageal reflux disease (GERD). GERD occurs when the reflux:  Happens often.  Causes frequent or severe symptoms.  Causes problems such as damage to the esophagus. When stomach acid comes in contact with the esophagus, the acid may cause soreness (inflammation) in the esophagus. Over time, GERD may create small holes (ulcers) in the lining of the esophagus. What are the causes? This condition is caused by a problem with the muscle between the esophagus and the stomach (lower esophageal sphincter, or LES). Normally, the LES muscle closes after food passes through the esophagus to the stomach.  When the LES is weakened or abnormal, it does not close properly, and that allows food and stomach acid to go back up into the esophagus. The LES can be weakened by certain dietary substances, medicines, and medical conditions, including:  Tobacco use.  Pregnancy.  Having a hiatal hernia.  Alcohol use.  Certain foods and beverages, such as coffee, chocolate, onions, and peppermint. What increases the risk? You are more likely to develop this condition if you:  Have an increased body weight.  Have a connective tissue disorder.  Use NSAID medicines. What are the signs or symptoms? Symptoms of this condition include:  Heartburn.  Difficult or painful swallowing.  The feeling of having a lump in the throat.  Abitter taste in the mouth.  Bad breath.  Having a large amount of saliva.  Having an upset or bloated stomach.  Belching.  Chest pain. Different conditions can cause chest pain. Make sure you see your health care provider if you experience chest pain.  Shortness of breath or wheezing.  Ongoing (chronic) cough or a night-time cough.  Wearing away of tooth enamel.  Weight loss. How is this diagnosed? Your health care provider will take a medical history and perform a physical exam. To determine if you have mild or severe GERD, your health care provider may also monitor how you respond to treatment. You may also have tests, including:  A test to examine your stomach and esophagus with a small camera (endoscopy).  A test thatmeasures the acidity level in your esophagus.  A test thatmeasures how much pressure is on your  esophagus.  A barium swallow or modified barium swallow test to show the shape, size, and functioning of your esophagus. How is this treated? The goal of treatment is to help relieve your symptoms and to prevent complications. Treatment for this condition may vary depending on how severe your symptoms are. Your health care provider may  recommend:  Changes to your diet.  Medicine.  Surgery. Follow these instructions at home: Eating and drinking   Follow a diet as recommended by your health care provider. This may involve avoiding foods and drinks such as: ? Coffee and tea (with or without caffeine). ? Drinks that containalcohol. ? Energy drinks and sports drinks. ? Carbonated drinks or sodas. ? Chocolate and cocoa. ? Peppermint and mint flavorings. ? Garlic and onions. ? Horseradish. ? Spicy and acidic foods, including peppers, chili powder, curry powder, vinegar, hot sauces, and barbecue sauce. ? Citrus fruit juices and citrus fruits, such as oranges, lemons, and limes. ? Tomato-based foods, such as red sauce, chili, salsa, and pizza with red sauce. ? Fried and fatty foods, such as donuts, french fries, potato chips, and high-fat dressings. ? High-fat meats, such as hot dogs and fatty cuts of red and white meats, such as rib eye steak, sausage, ham, and bacon. ? High-fat dairy items, such as whole milk, butter, and cream cheese.  Eat small, frequent meals instead of large meals.  Avoid drinking large amounts of liquid with your meals.  Avoid eating meals during the 2-3 hours before bedtime.  Avoid lying down right after you eat.  Do not exercise right after you eat. Lifestyle   Do not use any products that contain nicotine or tobacco, such as cigarettes, e-cigarettes, and chewing tobacco. If you need help quitting, ask your health care provider.  Try to reduce your stress by using methods such as yoga or meditation. If you need help reducing stress, ask your health care provider.  If you are overweight, reduce your weight to an amount that is healthy for you. Ask your health care provider for guidance about a safe weight loss goal. General instructions  Pay attention to any changes in your symptoms.  Take over-the-counter and prescription medicines only as told by your health care provider. Do not  take aspirin, ibuprofen, or other NSAIDs unless your health care provider told you to do so.  Wear loose-fitting clothing. Do not wear anything tight around your waist that causes pressure on your abdomen.  Raise (elevate) the head of your bed about 6 inches (15 cm).  Avoid bending over if this makes your symptoms worse.  Keep all follow-up visits as told by your health care provider. This is important. Contact a health care provider if:  You have: ? New symptoms. ? Unexplained weight loss. ? Difficulty swallowing or it hurts to swallow. ? Wheezing or a persistent cough. ? A hoarse voice.  Your symptoms do not improve with treatment. Get help right away if you:  Have pain in your arms, neck, jaw, teeth, or back.  Feel sweaty, dizzy, or light-headed.  Have chest pain or shortness of breath.  Vomit and your vomit looks like blood or coffee grounds.  Faint.  Have stool that is bloody or black.  Cannot swallow, drink, or eat. Summary  Gastroesophageal reflux happens when acid from the stomach flows up into the esophagus. GERD is a disease in which the reflux happens often, causes frequent or severe symptoms, or causes problems such as damage to the esophagus.  Treatment for  this condition may vary depending on how severe your symptoms are. Your health care provider may recommend diet and lifestyle changes, medicine, or surgery.  Contact a health care provider if you have new or worsening symptoms.  Take over-the-counter and prescription medicines only as told by your health care provider. Do not take aspirin, ibuprofen, or other NSAIDs unless your health care provider told you to do so.  Keep all follow-up visits as told by your health care provider. This is important. This information is not intended to replace advice given to you by your health care provider. Make sure you discuss any questions you have with your health care provider. Document Released: 05/13/2005 Document  Revised: 02/09/2018 Document Reviewed: 02/09/2018 Elsevier Interactive Patient Education  2019 [employer].  I appreciate the  opportunity to care for you  Thank You   Harl Bowie , MD

## 2019-01-25 ENCOUNTER — Encounter: Payer: Self-pay | Admitting: Gastroenterology

## 2019-01-25 ENCOUNTER — Ambulatory Visit (HOSPITAL_COMMUNITY): Admission: RE | Admit: 2019-01-25 | Payer: Medicare Other | Source: Ambulatory Visit

## 2019-02-14 DIAGNOSIS — R0602 Shortness of breath: Secondary | ICD-10-CM | POA: Diagnosis not present

## 2019-02-14 DIAGNOSIS — R0789 Other chest pain: Secondary | ICD-10-CM | POA: Diagnosis not present

## 2019-02-14 DIAGNOSIS — I2510 Atherosclerotic heart disease of native coronary artery without angina pectoris: Secondary | ICD-10-CM | POA: Diagnosis not present

## 2019-02-14 DIAGNOSIS — R0600 Dyspnea, unspecified: Secondary | ICD-10-CM

## 2019-02-14 HISTORY — DX: Dyspnea, unspecified: R06.00

## 2019-02-16 ENCOUNTER — Other Ambulatory Visit (HOSPITAL_COMMUNITY)
Admission: RE | Admit: 2019-02-16 | Discharge: 2019-02-16 | Disposition: A | Discharge status: Home / Self Care | Payer: Medicare Other | Source: Ambulatory Visit | Attending: Gastroenterology | Admitting: Gastroenterology

## 2019-02-16 DIAGNOSIS — Z1159 Encounter for screening for other viral diseases: Secondary | ICD-10-CM | POA: Diagnosis not present

## 2019-02-16 DIAGNOSIS — Z01812 Encounter for preprocedural laboratory examination: Secondary | ICD-10-CM | POA: Insufficient documentation

## 2019-02-16 LAB — SARS CORONAVIRUS 2 (TAT 6-24 HRS): SARS Coronavirus 2: NEGATIVE

## 2019-02-20 ENCOUNTER — Encounter (HOSPITAL_COMMUNITY): Payer: Self-pay

## 2019-02-20 ENCOUNTER — Telehealth: Payer: Self-pay | Admitting: Gastroenterology

## 2019-02-20 ENCOUNTER — Other Ambulatory Visit: Payer: Self-pay

## 2019-02-20 ENCOUNTER — Other Ambulatory Visit: Payer: Self-pay | Admitting: *Deleted

## 2019-02-20 DIAGNOSIS — J84112 Idiopathic pulmonary fibrosis: Secondary | ICD-10-CM | POA: Diagnosis not present

## 2019-02-20 DIAGNOSIS — K219 Gastro-esophageal reflux disease without esophagitis: Secondary | ICD-10-CM | POA: Diagnosis not present

## 2019-02-20 DIAGNOSIS — Z6828 Body mass index (BMI) 28.0-28.9, adult: Secondary | ICD-10-CM | POA: Diagnosis not present

## 2019-02-20 DIAGNOSIS — E663 Overweight: Secondary | ICD-10-CM | POA: Diagnosis not present

## 2019-02-20 DIAGNOSIS — E7800 Pure hypercholesterolemia, unspecified: Secondary | ICD-10-CM | POA: Diagnosis not present

## 2019-02-20 DIAGNOSIS — M810 Age-related osteoporosis without current pathological fracture: Secondary | ICD-10-CM | POA: Diagnosis not present

## 2019-02-20 DIAGNOSIS — R0600 Dyspnea, unspecified: Secondary | ICD-10-CM | POA: Diagnosis not present

## 2019-02-20 MED ORDER — SUPREP BOWEL PREP KIT 17.5-3.13-1.6 GM/177ML PO SOLN: 1.0000 | Freq: Once | ORAL | 0 refills | Status: AC

## 2019-02-20 NOTE — Progress Notes (Signed)
SPOKE W/  Sherry Wang     SCREENING SYMPTOMS OF COVID 19:   COUGH--NO  RUNNY NOSE--- NO  SORE THROAT---NO  NASAL CONGESTION----NO  SNEEZING----NO  SHORTNESS OF BREATH---NO  DIFFICULTY BREATHING---NO  TEMP >100.0 -----  UNEXPLAINED BODY ACHES------NO  CHILLS -------- NO  HEADACHES ---------NO  LOSS OF SMELL/ TASTE --------NO    HAVE YOU OR ANY FAMILY MEMBER TRAVELLED PAST 14 DAYS OUT OF THE   COUNTY--- Lives in [place]----NO COUNTRY----NO  HAVE YOU OR ANY FAMILY MEMBER BEEN EXPOSED TO ANYONE WITH COVID 19? NO

## 2019-02-20 NOTE — Progress Notes (Signed)
No answer when attempted to reach pt via telephone, was unable to leave voicemail.

## 2019-02-20 NOTE — Progress Notes (Signed)
Prep suprep sent to [facility] in [place]

## 2019-02-20 NOTE — Telephone Encounter (Signed)
Called patient to inform Suprep sent into pharmacy

## 2019-02-21 ENCOUNTER — Ambulatory Visit (HOSPITAL_COMMUNITY): Payer: Medicare Other | Admitting: Anesthesiology

## 2019-02-21 ENCOUNTER — Encounter (HOSPITAL_COMMUNITY)
Admission: RE | Disposition: A | Discharge status: Home / Self Care | Payer: Self-pay | Source: Home / Self Care | Attending: Gastroenterology

## 2019-02-21 ENCOUNTER — Ambulatory Visit (HOSPITAL_COMMUNITY)
Admission: RE | Admit: 2019-02-21 | Discharge: 2019-02-21 | Disposition: A | Discharge status: Home / Self Care | Payer: Medicare Other | Attending: Gastroenterology | Admitting: Gastroenterology

## 2019-02-21 ENCOUNTER — Encounter (HOSPITAL_COMMUNITY): Payer: Self-pay | Admitting: *Deleted

## 2019-02-21 DIAGNOSIS — M810 Age-related osteoporosis without current pathological fracture: Secondary | ICD-10-CM | POA: Diagnosis not present

## 2019-02-21 DIAGNOSIS — K7460 Unspecified cirrhosis of liver: Secondary | ICD-10-CM | POA: Insufficient documentation

## 2019-02-21 DIAGNOSIS — Z7983 Long term (current) use of bisphosphonates: Secondary | ICD-10-CM | POA: Insufficient documentation

## 2019-02-21 DIAGNOSIS — Z79899 Other long term (current) drug therapy: Secondary | ICD-10-CM | POA: Diagnosis not present

## 2019-02-21 DIAGNOSIS — F329 Major depressive disorder, single episode, unspecified: Secondary | ICD-10-CM | POA: Diagnosis not present

## 2019-02-21 DIAGNOSIS — F419 Anxiety disorder, unspecified: Secondary | ICD-10-CM | POA: Diagnosis not present

## 2019-02-21 DIAGNOSIS — G4730 Sleep apnea, unspecified: Secondary | ICD-10-CM | POA: Insufficient documentation

## 2019-02-21 DIAGNOSIS — J45909 Unspecified asthma, uncomplicated: Secondary | ICD-10-CM | POA: Diagnosis not present

## 2019-02-21 DIAGNOSIS — R1310 Dysphagia, unspecified: Secondary | ICD-10-CM

## 2019-02-21 DIAGNOSIS — K229 Disease of esophagus, unspecified: Secondary | ICD-10-CM | POA: Diagnosis not present

## 2019-02-21 DIAGNOSIS — Z1211 Encounter for screening for malignant neoplasm of colon: Secondary | ICD-10-CM

## 2019-02-21 DIAGNOSIS — K648 Other hemorrhoids: Secondary | ICD-10-CM | POA: Insufficient documentation

## 2019-02-21 DIAGNOSIS — R0789 Other chest pain: Secondary | ICD-10-CM

## 2019-02-21 DIAGNOSIS — E785 Hyperlipidemia, unspecified: Secondary | ICD-10-CM | POA: Diagnosis not present

## 2019-02-21 DIAGNOSIS — K219 Gastro-esophageal reflux disease without esophagitis: Secondary | ICD-10-CM | POA: Diagnosis not present

## 2019-02-21 DIAGNOSIS — K2970 Gastritis, unspecified, without bleeding: Secondary | ICD-10-CM | POA: Diagnosis not present

## 2019-02-21 DIAGNOSIS — J8410 Pulmonary fibrosis, unspecified: Secondary | ICD-10-CM | POA: Insufficient documentation

## 2019-02-21 DIAGNOSIS — Q438 Other specified congenital malformations of intestine: Secondary | ICD-10-CM | POA: Insufficient documentation

## 2019-02-21 HISTORY — DX: Hypotension, unspecified: I95.9

## 2019-02-21 HISTORY — DX: Bradycardia, unspecified: R00.1

## 2019-02-21 HISTORY — DX: Presence of spectacles and contact lenses: Z97.3

## 2019-02-21 HISTORY — PX: ESOPHAGOGASTRODUODENOSCOPY (EGD) WITH PROPOFOL: SHX5813

## 2019-02-21 HISTORY — DX: Tachycardia, unspecified: R00.0

## 2019-02-21 HISTORY — DX: Presence of dental prosthetic device (complete) (partial): Z97.2

## 2019-02-21 HISTORY — PX: COLONOSCOPY WITH PROPOFOL: SHX5780

## 2019-02-21 HISTORY — DX: Personal history of other diseases of the nervous system and sense organs: Z86.69

## 2019-02-21 SURGERY — COLONOSCOPY WITH PROPOFOL
Anesthesia: Monitor Anesthesia Care

## 2019-02-21 MED ORDER — LIDOCAINE 2% (20 MG/ML) 5 ML SYRINGE
INTRAMUSCULAR | Status: DC | PRN
  Administered 2019-02-21: 50 mg via INTRAVENOUS

## 2019-02-21 MED ORDER — PROPOFOL 500 MG/50ML IV EMUL
INTRAVENOUS | Status: DC | PRN
  Administered 2019-02-21: 100 ug/kg/min via INTRAVENOUS

## 2019-02-21 MED ORDER — SODIUM CHLORIDE 0.9 % IV SOLN: INTRAVENOUS | Status: DC

## 2019-02-21 MED ORDER — LACTATED RINGERS IV SOLN
INTRAVENOUS | Status: DC
  Administered 2019-02-21: 11:00:00 1000 mL via INTRAVENOUS

## 2019-02-21 MED ORDER — PROPOFOL 10 MG/ML IV BOLUS
INTRAVENOUS | Status: AC
  Filled 2019-02-21: qty 40

## 2019-02-21 MED ORDER — FLUCONAZOLE 100 MG PO TABS: 100.0000 mg | ORAL_TABLET | Freq: Every day | ORAL | 0 refills | Status: AC

## 2019-02-21 MED ORDER — PROPOFOL 10 MG/ML IV BOLUS
INTRAVENOUS | Status: DC | PRN
  Administered 2019-02-21 (×3): 20 mg via INTRAVENOUS

## 2019-02-21 MED ORDER — EPHEDRINE SULFATE-NACL 50-0.9 MG/10ML-% IV SOSY
PREFILLED_SYRINGE | INTRAVENOUS | Status: DC | PRN
  Administered 2019-02-21: 15 mg via INTRAVENOUS
  Administered 2019-02-21: 10 mg via INTRAVENOUS
  Administered 2019-02-21: 15 mg via INTRAVENOUS

## 2019-02-21 MED ORDER — PROPOFOL 10 MG/ML IV BOLUS
INTRAVENOUS | Status: AC
  Filled 2019-02-21: qty 20

## 2019-02-21 SURGICAL SUPPLY — 25 items

## 2019-02-21 NOTE — H&P (Signed)
[facility] History and Physical   Primary Care Physician:  Greig Right, MD   Reason for Procedure:   Cirrhosis, esophageal varices screening and colorectal cancer screening  Plan:    EGD and Colonoscopy     HPI: Sherry Wang is a 78 y.o. female here for EGD and colonoscopy. Denies any nausea, vomiting, abdominal pain, melena or bright red blood per rectum   The risks and benefits as well as alternatives of endoscopic procedure(s) have been discussed and reviewed. All questions answered. The patient agrees to proceed.    Past Medical History:  Diagnosis Date  . Anemia   . Anemia, iron deficiency 11/28/2014  . Anxiety   . Asthma   . AVM (arteriovenous malformation) of small bowel, acquired   . Biliary cirrhosis ([place])   . Cirrhosis ([place])   . Depression   . Dyspnea 02/14/2019  . Family history of adverse reaction to anesthesia    sister had hysteria after anesthesia  . Fast heart beat    occ  . Fibromyalgia   . Gallstones   . GERD (gastroesophageal reflux disease)   . GI bleeding   . Headache   . History of cataract   . History of home oxygen therapy    2 liters at hs only  . Hyperlipemia   . Junctional bradycardia   . Low blood pressure   . Non-Q wave non-ST elevation myocardial infarction (NSTEMI) ([place]) 2004  . Osteoporosis   . Pneumonia    last 2016  . 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 Pulmonary fibrosis ([place])   . Sleep apnea    has had sleep study  . Tingling    left foot sing back surgery   . Wears glasses    Reading  . Wears partial dentures    lower    Past Surgical History:  Procedure Laterality Date  . CARDIAC CATHETERIZATION  2004  . CATARACT EXTRACTION, BILATERAL    . CHOLECYSTECTOMY    . COLONOSCOPY  11/08/2008  . ESOPHAGOGASTRODUODENOSCOPY (EGD) WITH PROPOFOL N/A 02/02/2017   Procedure: ESOPHAGOGASTRODUODENOSCOPY (EGD) WITH PROPOFOL;  Surgeon: Mauri Pole, MD;  Location: [facility];  Service: Endoscopy;   Laterality: N/A;  . LAMINECTOMY  age 13  . SHOULDER SURGERY Left   . TOE SURGERY Right   . TONSILLECTOMY     as a child  . WRIST FRACTURE SURGERY Left     Prior to Admission medications   Medication Sig Start Date End Date Taking? Authorizing Provider  acetaminophen (TYLENOL) 325 MG tablet Take 650 mg by mouth every 6 (six) hours as needed for moderate pain or headache.    Yes [provider]  albuterol (PROVENTIL HFA;VENTOLIN HFA) 108 (90 Base) MCG/ACT inhaler Inhale 2 puffs into the lungs every 6 (six) hours as needed for wheezing or shortness of breath.    Yes [provider]  amitriptyline (ELAVIL) 10 MG tablet Take 10-20 mg by mouth at bedtime.    Yes [provider]  azelastine (ASTELIN) 0.1 % nasal spray Place 2 sprays into both nostrils 2 (two) times daily. 03/02/18  Yes [provider]  budesonide-formoterol (SYMBICORT) 160-4.5 MCG/ACT inhaler Inhale 2 puffs into the lungs 2 (two) times daily.    Yes [provider]  calcium carbonate (TUMS - DOSED IN MG ELEMENTAL CALCIUM) 500 MG chewable tablet Chew 2 tablets by mouth daily as needed for indigestion or heartburn.    Yes [provider]  cholecalciferol (VITAMIN D3) 25 MCG (1000 UT)  tablet Take 2,000 Units by mouth daily.   Yes [provider]  cyanocobalamin 1000 MCG tablet Take 1,000 mcg by mouth every evening.   Yes [provider]  dicyclomine (BENTYL) 10 MG capsule Take 10 mg by mouth daily.   Yes [provider]  fluticasone (FLONASE) 50 MCG/ACT nasal spray Place 2 sprays into both nostrils every evening.    Yes [provider]  ipratropium-albuterol (DUONEB) 0.5-2.5 (3) MG/3ML SOLN Take 3 mLs by nebulization every 6 (six) hours as needed.   Yes [provider]  Nintedanib (OFEV) 150 MG CAPS Take 150 mg by mouth 2 (two) times daily.   Yes [provider]  omeprazole (PRILOSEC) 20 MG capsule Take 20 mg by mouth daily.   Yes  [provider]  OXYGEN Inhale into the lungs at bedtime. 2 liters at bedtime   Yes [provider]  rosuvastatin (CRESTOR) 20 MG tablet Take 20 mg by mouth at bedtime.  02/16/18  Yes [provider]  sertraline (ZOLOFT) 100 MG tablet Take 200 mg by mouth at bedtime.    Yes [provider]  alendronate (FOSAMAX) 70 MG tablet Take 70 mg by mouth once a week.  09/28/18   [provider]  nitroGLYCERIN (NITROSTAT) 0.4 MG SL tablet Place 0.4 mg under the tongue every 5 (five) minutes as needed for chest pain.  11/26/16 08/03/22  [provider]  sucralfate (CARAFATE) 1 GM/10ML suspension Take 10 mLs (1 g total) by mouth 4 (four) times daily. Patient not taking: Reported on 02/13/2019 01/19/19   Mauri Pole, MD    Current Facility-Administered Medications  Medication Dose Route Frequency Provider Last Rate Last Dose  . 0.9 %  sodium chloride infusion   Intravenous Continuous Carmilla Granville V, MD      . lactated ringers infusion   Intravenous Continuous Mauri Pole, MD        Allergies as of 01/19/2019 - Review Complete 01/19/2019  Allergen Reaction Noted  . Paxil [paroxetine hcl] Itching 08/06/2014  . Tamiflu [oseltamivir phosphate] Nausea And Vomiting 08/06/2014  . Oseltamivir phosphate Nausea Only 08/06/2014    Family History  Problem Relation Age of Onset  . Asthma Father   . Leukemia Father        died age 7  . Diabetes type II Father   . Cervical cancer Paternal Grandmother   . Alzheimer's disease Mother   . Hypertension Mother   . Heart disease Maternal Uncle        x2  . Colon polyps Paternal Aunt   . Stroke Maternal Aunt   . Stroke Sister   . Sleep apnea Sister   . Diabetes type II Sister   . Obesity Sister   . Hypertension Brother   . Colon cancer Neg Hx   . Esophageal cancer Neg Hx   . Gallbladder disease Neg Hx     Social History   Socioeconomic History  . Marital status: Widowed    Spouse name:  Not on file  . Number of children: 1  . Years of education: Not on file  . Highest education level: Not on file  Occupational History  . Occupation: Retired Aeronautical engineer  . Financial resource strain: Not on file  . Food insecurity    Worry: Not on file    Inability: Not on file  . Transportation needs    Medical: Not on file    Non-medical: Not on file  Tobacco Use  .  Smoking status: Former Smoker    Packs/day: 2.00    Years: 20.00    Pack years: 40.00    Types: Cigarettes    Quit date: 08/18/1983    Years since quitting: 35.5  . Smokeless tobacco: Never Used  Substance and Sexual Activity  . Alcohol use: Not Currently    Alcohol/week: 0.0 standard drinks  . Drug use: No  . Sexual activity: Not on file  Lifestyle  . Physical activity    Days per week: Not on file    Minutes per session: Not on file  . Stress: Not on file  Relationships  . Social Herbalist on phone: Not on file    Gets together: Not on file    Attends religious service: Not on file    Active member of club or organization: Not on file    Attends meetings of clubs or organizations: Not on file    Relationship status: Not on file  . Intimate partner violence    Fear of current or ex partner: Not on file    Emotionally abused: Not on file    Physically abused: Not on file    Forced sexual activity: Not on file  Other Topics Concern  . Not on file  Social History Narrative  . Not on file    Review of Systems:  All other review of systems negative except as mentioned in the HPI.  Physical Exam: Vital signs in last 24 hours:     General:   Alert,  Well-developed, well-nourished, pleasant and cooperative in NAD Lungs:  Clear throughout to auscultation.   Heart:  Regular rate and rhythm; no murmurs, clicks, rubs,  or gallops. Abdomen:  Soft, nontender and nondistended. Normal bowel sounds.   Neuro/Psych:  Alert and cooperative. Normal mood and affect. A and O x 3   K. Denzil Magnuson , MD [phone]

## 2019-02-21 NOTE — Discharge Instructions (Signed)
Upper Endoscopy, Adult, Care After This sheet gives you information about how to care for yourself after your procedure. Your health care provider may also give you more specific instructions. If you have problems or questions, contact your health care provider. What can I expect after the procedure? After the procedure, it is common to have:  A sore throat.  Mild stomach pain or discomfort.  Bloating.  Nausea. Follow these instructions at home:   Follow instructions from your health care provider about what to eat or drink after your procedure.  Return to your normal activities as told by your health care provider. Ask your health care provider what activities are safe for you.  Take over-the-counter and prescription medicines only as told by your health care provider.  Do not drive for 24 hours if you were given a sedative during your procedure.  Keep all follow-up visits as told by your health care provider. This is important. Contact a health care provider if you have:  A sore throat that lasts longer than one day.  Trouble swallowing. Get help right away if:  You vomit blood or your vomit looks like coffee grounds.  You have: ? A fever. ? Bloody, black, or tarry stools. ? A severe sore throat or you cannot swallow. ? Difficulty breathing. ? Severe pain in your chest or abdomen. Summary  After the procedure, it is common to have a sore throat, mild stomach discomfort, bloating, and nausea.  Do not drive for 24 hours if you were given a sedative during the procedure.  Follow instructions from your health care provider about what to eat or drink after your procedure.  Return to your normal activities as told by your health care provider. This information is not intended to replace advice given to you by your health care provider. Make sure you discuss any questions you have with your health care provider. Document Released: 02/02/2012 Document Revised: 01/25/2018  Document Reviewed: 01/03/2018 Elsevier Patient Education  2020 [employer]. Colonoscopy, Adult, Care After This sheet gives you information about how to care for yourself after your procedure. Your health care provider may also give you more specific instructions. If you have problems or questions, contact your health care provider. What can I expect after the procedure? After the procedure, it is common to have:  A small amount of blood in your stool for 24 hours after the procedure.  Some gas.  Mild abdominal cramping or bloating. Follow these instructions at home: General instructions  For the first 24 hours after the procedure: ? Do not drive or use machinery. ? Do not sign important documents. ? Do not drink alcohol. ? Do your regular daily activities at a slower pace than normal. ? Eat soft, easy-to-digest foods.  Take over-the-counter or prescription medicines only as told by your health care provider. Relieving cramping and bloating   Try walking around when you have cramps or feel bloated.  Apply heat to your abdomen as told by your health care provider. Use a heat source that your health care provider recommends, such as a moist heat pack or a heating pad. ? Place a towel between your skin and the heat source. ? Leave the heat on for 20-30 minutes. ? Remove the heat if your skin turns bright red. This is especially important if you are unable to feel pain, heat, or cold. You may have a greater risk of getting burned. Eating and drinking   Drink enough fluid to keep your urine pale  yellow.  Resume your normal diet as instructed by your health care provider. Avoid heavy or fried foods that are hard to digest.  Avoid drinking alcohol for as long as instructed by your health care provider. Contact a health care provider if:  You have blood in your stool 2-3 days after the procedure. Get help right away if:  You have more than a small spotting of blood in your  stool.  You pass large blood clots in your stool.  Your abdomen is swollen.  You have nausea or vomiting.  You have a fever.  You have increasing abdominal pain that is not relieved with medicine. Summary  After the procedure, it is common to have a small amount of blood in your stool. You may also have mild abdominal cramping and bloating.  For the first 24 hours after the procedure, do not drive or use machinery, sign important documents, or drink alcohol.  Contact your health care provider if you have a lot of blood in your stool, nausea or vomiting, a fever, or increased abdominal pain. This information is not intended to replace advice given to you by your health care provider. Make sure you discuss any questions you have with your health care provider. Document Released: 03/17/2004 Document Revised: 05/26/2017 Document Reviewed: 10/15/2015 Elsevier Patient Education  2020 [employer].

## 2019-02-21 NOTE — Transfer of Care (Signed)
Immediate Anesthesia Transfer of Care Note  Patient: Sherry Wang  Procedure(s) Performed: COLONOSCOPY WITH PROPOFOL (N/A ) ESOPHAGOGASTRODUODENOSCOPY (EGD) WITH PROPOFOL (N/A )  Patient Location: PACU and Endoscopy Unit  Anesthesia Type:MAC  Level of Consciousness: awake, alert  and oriented  Airway & Oxygen Therapy: Patient Spontanous Breathing and Patient connected to nasal cannula oxygen  Post-op Assessment: Report given to RN and Post -op Vital signs reviewed and stable  Post vital signs: Reviewed and stable  Last Vitals:  Vitals Value Taken Time  BP    Temp    Pulse    Resp 14 02/21/19 1210  SpO2    Vitals shown include unvalidated device data.  Last Pain:  Vitals:   02/21/19 1042  TempSrc: Oral  PainSc: 0-No pain         Complications: No apparent anesthesia complications

## 2019-02-21 NOTE — Anesthesia Procedure Notes (Signed)
Procedure Name: MAC Date/Time: 02/21/2019 11:20 AM Performed by: Lollie Sails, CRNA Pre-anesthesia Checklist: Patient identified, Emergency Drugs available, Suction available, Patient being monitored and Timeout performed Oxygen Delivery Method: Simple face mask

## 2019-02-21 NOTE — Anesthesia Preprocedure Evaluation (Addendum)
Anesthesia Evaluation  Patient identified by MRN, date of birth, ID band Patient awake    Reviewed: Allergy & Precautions, NPO status , Patient's Chart, lab work & pertinent test results  Airway Mallampati: I  TM Distance: >3 FB Neck ROM: Full    Dental no notable dental hx. (+) Teeth Intact, Dental Advisory Given   Pulmonary shortness of breath and Long-Term Oxygen Therapy, asthma , sleep apnea (2L qhs) and Oxygen sleep apnea , former smoker,  H/o pulmonary fibrosis   Pulmonary exam normal breath sounds clear to auscultation       Cardiovascular + Past MI  Normal cardiovascular exam Rhythm:Regular Rate:Normal  Holter Monitor 2020 An extended ZIO monitor was performed for 14 days 0 hours to evaluate for bradycardia with previous documented junctional rhythm.  Predominant rhythm is sinus with minimum maximum and average heart rates of 36, 114 and 62 bpm.  Ventricular ectopy was rare with less than 1% PVCs.  Supraventricular ectopy was occasional with a burden of 2.7% with predominant isolated APCs and couplets.  There was a brief episode 16.4 seconds of atrial tachycardia with variable conduction with a rate of 98 bpm.  There are rare episodes of brief runs of atrial premature contractions but no episodes of atrial fibrillation or flutter.  Junctional rhythm was seen initiated by both sinus bradycardia and atrial premature contractions of the cycle length of 1.4 seconds.  There were no episodes of sinus node or high degree AV block.  There were 2 triggered episodes 1 of these was the junctional rhythm and the other associated with supraventricular and ventricular premature beats.   Conclusion, occasional supraventricular ectopy with a brief episode of atrial tachycardia as well as brief junctional rhythm initiated with sinus bradycardia and after atrial premature contractions.   Neuro/Psych  Headaches, PSYCHIATRIC  DISORDERS Anxiety Depression    GI/Hepatic GERD  Medicated,(+) Cirrhosis  (primary biliary cirrhosis)      ,   Endo/Other  negative endocrine ROS  Renal/GU negative Renal ROS  negative genitourinary   Musculoskeletal  (+) Arthritis ,   Abdominal   Peds  Hematology negative hematology ROS (+)   Anesthesia Other Findings EGD and colonoscopy for cirrhosis  Reproductive/Obstetrics                           Anesthesia Physical Anesthesia Plan  ASA: III  Anesthesia Plan: MAC   Post-op Pain Management:    Induction: Intravenous  PONV Risk Score and Plan: 2 and Propofol infusion and Treatment may vary due to age or medical condition  Airway Management Planned: Natural Airway and Nasal Cannula  Additional Equipment:   Intra-op Plan:   Post-operative Plan:   Informed Consent: I have reviewed the patients History and Physical, chart, labs and discussed the procedure including the risks, benefits and alternatives for the proposed anesthesia with the patient or authorized representative who has indicated his/her understanding and acceptance.     Dental advisory given  Plan Discussed with: CRNA  Anesthesia Plan Comments:         Anesthesia Quick Evaluation

## 2019-02-21 NOTE — Op Note (Signed)
[facility] Patient Name: Sherry Wang Procedure Date: 02/21/2019 MRN: [id number] Attending MD: Mauri Pole , MD Date of Birth: 1941-03-23 CSN: [id number] Age: 78 Admit Type: Outpatient Procedure:                Upper GI endoscopy Indications:              Odynophagia, atypical chest discomfort, cirrhosis                            esophageal varices screening. Providers:                Mauri Pole, MD, Cleda Daub, RN,                            William Dalton, Technician Referring MD:              Medicines:                Monitored Anesthesia Care Complications:            No immediate complications. Estimated Blood Loss:     Estimated blood loss: none. Procedure:                Pre-Anesthesia Assessment:                           - Prior to the procedure, a History and Physical                            was performed, and patient medications and                            allergies were reviewed. The patient's tolerance of                            previous anesthesia was also reviewed. The risks                            and benefits of the procedure and the sedation                            options and risks were discussed with the patient.                            All questions were answered, and informed consent                            was obtained. Prior Anticoagulants: The patient has                            taken no previous anticoagulant or antiplatelet                            agents. ASA Grade Assessment: IV - A patient with  severe systemic disease that is a constant threat                            to life. After reviewing the risks and benefits,                            the patient was deemed in satisfactory condition to                            undergo the procedure.                           After obtaining informed consent, the endoscope was                            passed under  direct vision. Throughout the                            procedure, the patient's blood pressure, pulse, and                            oxygen saturations were monitored continuously. The                            [id number]-[id number] ([id number]) Olympus gastroscope was                            introduced through the mouth, and advanced to the                            second part of duodenum. The upper GI endoscopy was                            accomplished without difficulty. The patient                            tolerated the procedure well. Scope In: Scope Out: Findings:      Patchy, white plaques were found in the upper third of the esophagus and       in the middle third of the esophagus. No esophageal varices.      Patchy mild inflammation characterized by congestion (edema) and       erythema was found in the gastric antrum. No gastric varices      The examined duodenum was normal. Impression:               - Esophageal plaques were found, consistent with                            candidiasis.                           - Gastritis.                           - Normal examined duodenum.                           -  No specimens collected. Moderate Sedation:      N/A Recommendation:           - Patient has a contact number available for                            emergencies. The signs and symptoms of potential                            delayed complications were discussed with the                            patient. Return to normal activities tomorrow.                            Written discharge instructions were provided to the                            patient.                           - Resume previous diet.                           - Continue present medications.                           - Diflucan (fluconazole) 100 mg PO daily for 7 days.                           - Continue Omeprazole                           - Follow an antireflux regimen indefinitely. Procedure  Code(s):        --- Professional ---                           [phone], Esophagogastroduodenoscopy, flexible,                            transoral; diagnostic, including collection of                            specimen(s) by brushing or washing, when performed                            (separate procedure) Diagnosis Code(s):        --- Professional ---                           K22.9, Disease of esophagus, unspecified                           K29.70, Gastritis, unspecified, without bleeding                           R13.10, Dysphagia, unspecified CPT copyright 2019 [employer]. All rights reserved. The codes  documented in this report are preliminary and upon coder review may  be revised to meet current compliance requirements. Mauri Pole, MD 02/21/2019 12:11:39 PM This report has been signed electronically. Number of Addenda: 0

## 2019-02-21 NOTE — Op Note (Signed)
[facility] Patient Name: Sherry Wang Procedure Date: 02/21/2019 MRN: [id number] Attending MD: Mauri Pole , MD Date of Birth: 02/12/1941 CSN: [id number] Age: 78 Admit Type: Outpatient Procedure:                Colonoscopy Indications:              Screening for colorectal malignant neoplasm Providers:                Mauri Pole, MD, Cleda Daub, RN,                            William Dalton, Technician Referring MD:              Medicines:                Monitored Anesthesia Care Complications:            No immediate complications. Estimated Blood Loss:     Estimated blood loss was minimal. Procedure:                Pre-Anesthesia Assessment:                           - Prior to the procedure, a History and Physical                            was performed, and patient medications and                            allergies were reviewed. The patient's tolerance of                            previous anesthesia was also reviewed. The risks                            and benefits of the procedure and the sedation                            options and risks were discussed with the patient.                            All questions were answered, and informed consent                            was obtained. Prior Anticoagulants: The patient has                            taken no previous anticoagulant or antiplatelet                            agents. ASA Grade Assessment: IV - A patient with                            severe systemic disease that is a constant threat  to life. After reviewing the risks and benefits,                            the patient was deemed in satisfactory condition to                            undergo the procedure.                           After obtaining informed consent, the colonoscope                            was passed under direct vision. Throughout the                            procedure,  the patient's blood pressure, pulse, and                            oxygen saturations were monitored continuously. The                            [id number]-[id number] ([id number]) Olympus pediatric colonscope                            was introduced through the anus and advanced to the                            the cecum, identified by appendiceal orifice and                            ileocecal valve. The colonoscopy was technically                            difficult and complex due to a redundant colon and                            a tortuous colon. Successful completion of the                            procedure was aided by applying abdominal pressure.                            The patient tolerated the procedure well. The                            quality of the bowel preparation was good. The                            ileocecal valve, appendiceal orifice, and rectum                            were photographed. Scope In: 11:31:42 AM Scope Out: 12:02:18 PM Scope Withdrawal Time: 0 hours 8 minutes 12 seconds  Total Procedure Duration: 0 hours  30 minutes 36 seconds  Findings:      The perianal and digital rectal examinations were normal.      Non-bleeding internal hemorrhoids were found during retroflexion. The       hemorrhoids were medium-sized.      The exam was otherwise without abnormality. Impression:               - Non-bleeding internal hemorrhoids.                           - The examination was otherwise normal.                           - No specimens collected. Moderate Sedation:      Not Applicable - Patient had care per Anesthesia. Recommendation:           - Patient has a contact number available for                            emergencies. The signs and symptoms of potential                            delayed complications were discussed with the                            patient. Return to normal activities tomorrow.                            Written discharge  instructions were provided to the                            patient.                           - Resume previous diet.                           - Continue present medications.                           - No repeat colonoscopy due to age.                           - Return to GI clinic (virtual visit) in 3 months. Procedure Code(s):        --- Professional ---                           J6811, Colorectal cancer screening; colonoscopy on                            individual not meeting criteria for high risk Diagnosis Code(s):        --- Professional ---                           Z12.11, Encounter for screening for malignant  neoplasm of colon                           K64.8, Other hemorrhoids CPT copyright 2019 American Medical Association. All rights reserved. The codes documented in this report are preliminary and upon coder review may  be revised to meet current compliance requirements. Mauri Pole, MD 02/21/2019 12:15:35 PM This report has been signed electronically. Number of Addenda: 0

## 2019-02-22 ENCOUNTER — Encounter: Payer: Medicare Other | Admitting: Gastroenterology

## 2019-02-22 NOTE — Anesthesia Postprocedure Evaluation (Signed)
Anesthesia Post Note  Patient: Sherry Wang  Procedure(s) Performed: COLONOSCOPY WITH PROPOFOL (N/A ) ESOPHAGOGASTRODUODENOSCOPY (EGD) WITH PROPOFOL (N/A )     Anesthesia Post Evaluation  Last Vitals:  Vitals:   02/21/19 1220 02/21/19 1225  BP: 128/87 112/66  Pulse: 71 72  Resp: 15 15  Temp:  (!) 36.3 C  SpO2: 98% 100%    Last Pain:  Vitals:   02/21/19 1225  TempSrc: Temporal  PainSc: 0-No pain   Pain Goal:                   Mylen Mangan L Nandi Tonnesen

## 2019-02-23 ENCOUNTER — Encounter (HOSPITAL_COMMUNITY): Payer: Self-pay | Admitting: Gastroenterology

## 2019-02-28 DIAGNOSIS — G2581 Restless legs syndrome: Secondary | ICD-10-CM | POA: Diagnosis not present

## 2019-02-28 DIAGNOSIS — J84112 Idiopathic pulmonary fibrosis: Secondary | ICD-10-CM | POA: Diagnosis not present

## 2019-02-28 DIAGNOSIS — G4733 Obstructive sleep apnea (adult) (pediatric): Secondary | ICD-10-CM | POA: Diagnosis not present

## 2019-02-28 DIAGNOSIS — J301 Allergic rhinitis due to pollen: Secondary | ICD-10-CM | POA: Diagnosis not present

## 2019-02-28 DIAGNOSIS — J4520 Mild intermittent asthma, uncomplicated: Secondary | ICD-10-CM | POA: Diagnosis not present

## 2019-03-06 DIAGNOSIS — R0602 Shortness of breath: Secondary | ICD-10-CM | POA: Diagnosis not present

## 2019-03-17 ENCOUNTER — Other Ambulatory Visit: Payer: Self-pay

## 2019-04-18 ENCOUNTER — Other Ambulatory Visit: Payer: Self-pay

## 2019-06-05 ENCOUNTER — Ambulatory Visit: Payer: Medicare Other | Admitting: Gastroenterology

## 2019-06-27 DIAGNOSIS — J84112 Idiopathic pulmonary fibrosis: Secondary | ICD-10-CM | POA: Diagnosis not present

## 2019-06-27 DIAGNOSIS — J310 Chronic rhinitis: Secondary | ICD-10-CM | POA: Diagnosis not present

## 2019-06-27 DIAGNOSIS — J4520 Mild intermittent asthma, uncomplicated: Secondary | ICD-10-CM | POA: Diagnosis not present

## 2019-06-27 DIAGNOSIS — G4733 Obstructive sleep apnea (adult) (pediatric): Secondary | ICD-10-CM | POA: Diagnosis not present

## 2019-06-27 DIAGNOSIS — G2581 Restless legs syndrome: Secondary | ICD-10-CM | POA: Diagnosis not present

## 2019-09-08 DIAGNOSIS — R0602 Shortness of breath: Secondary | ICD-10-CM | POA: Diagnosis not present

## 2019-09-08 DIAGNOSIS — R079 Chest pain, unspecified: Secondary | ICD-10-CM | POA: Diagnosis not present

## 2019-09-27 DIAGNOSIS — J310 Chronic rhinitis: Secondary | ICD-10-CM | POA: Diagnosis not present

## 2019-09-27 DIAGNOSIS — G4733 Obstructive sleep apnea (adult) (pediatric): Secondary | ICD-10-CM | POA: Diagnosis not present

## 2019-09-27 DIAGNOSIS — G2581 Restless legs syndrome: Secondary | ICD-10-CM | POA: Diagnosis not present

## 2019-09-27 DIAGNOSIS — J4520 Mild intermittent asthma, uncomplicated: Secondary | ICD-10-CM | POA: Diagnosis not present

## 2019-09-27 DIAGNOSIS — J8410 Pulmonary fibrosis, unspecified: Secondary | ICD-10-CM | POA: Diagnosis not present

## 2019-09-27 DIAGNOSIS — J84112 Idiopathic pulmonary fibrosis: Secondary | ICD-10-CM | POA: Diagnosis not present

## 2019-09-28 DIAGNOSIS — Z6828 Body mass index (BMI) 28.0-28.9, adult: Secondary | ICD-10-CM | POA: Diagnosis not present

## 2019-09-28 DIAGNOSIS — J84112 Idiopathic pulmonary fibrosis: Secondary | ICD-10-CM | POA: Diagnosis not present

## 2019-09-28 DIAGNOSIS — K219 Gastro-esophageal reflux disease without esophagitis: Secondary | ICD-10-CM | POA: Diagnosis not present

## 2019-09-28 DIAGNOSIS — E7800 Pure hypercholesterolemia, unspecified: Secondary | ICD-10-CM | POA: Diagnosis not present

## 2019-09-28 DIAGNOSIS — I499 Cardiac arrhythmia, unspecified: Secondary | ICD-10-CM | POA: Diagnosis not present

## 2019-09-28 DIAGNOSIS — E663 Overweight: Secondary | ICD-10-CM | POA: Diagnosis not present

## 2019-10-10 DIAGNOSIS — I499 Cardiac arrhythmia, unspecified: Secondary | ICD-10-CM | POA: Diagnosis not present

## 2019-10-11 DIAGNOSIS — I499 Cardiac arrhythmia, unspecified: Secondary | ICD-10-CM | POA: Diagnosis not present

## 2019-10-11 DIAGNOSIS — Z23 Encounter for immunization: Secondary | ICD-10-CM | POA: Diagnosis not present

## 2019-11-08 DIAGNOSIS — J84112 Idiopathic pulmonary fibrosis: Secondary | ICD-10-CM | POA: Diagnosis not present

## 2019-11-08 DIAGNOSIS — G2581 Restless legs syndrome: Secondary | ICD-10-CM | POA: Diagnosis not present

## 2019-11-08 DIAGNOSIS — G4733 Obstructive sleep apnea (adult) (pediatric): Secondary | ICD-10-CM | POA: Diagnosis not present

## 2019-11-08 DIAGNOSIS — Z23 Encounter for immunization: Secondary | ICD-10-CM | POA: Diagnosis not present

## 2019-11-08 DIAGNOSIS — J4520 Mild intermittent asthma, uncomplicated: Secondary | ICD-10-CM | POA: Diagnosis not present

## 2019-11-08 DIAGNOSIS — J310 Chronic rhinitis: Secondary | ICD-10-CM | POA: Diagnosis not present

## 2019-11-10 DIAGNOSIS — R945 Abnormal results of liver function studies: Secondary | ICD-10-CM | POA: Diagnosis not present

## 2019-12-19 DIAGNOSIS — J4520 Mild intermittent asthma, uncomplicated: Secondary | ICD-10-CM | POA: Diagnosis not present

## 2019-12-19 DIAGNOSIS — J310 Chronic rhinitis: Secondary | ICD-10-CM | POA: Diagnosis not present

## 2019-12-19 DIAGNOSIS — G4733 Obstructive sleep apnea (adult) (pediatric): Secondary | ICD-10-CM | POA: Diagnosis not present

## 2019-12-19 DIAGNOSIS — J84112 Idiopathic pulmonary fibrosis: Secondary | ICD-10-CM | POA: Diagnosis not present

## 2019-12-19 DIAGNOSIS — G2581 Restless legs syndrome: Secondary | ICD-10-CM | POA: Diagnosis not present

## 2019-12-25 DIAGNOSIS — R945 Abnormal results of liver function studies: Secondary | ICD-10-CM | POA: Diagnosis not present

## 2019-12-27 DIAGNOSIS — I700 Atherosclerosis of aorta: Secondary | ICD-10-CM | POA: Diagnosis not present

## 2019-12-27 DIAGNOSIS — J439 Emphysema, unspecified: Secondary | ICD-10-CM | POA: Diagnosis not present

## 2019-12-27 DIAGNOSIS — J84112 Idiopathic pulmonary fibrosis: Secondary | ICD-10-CM | POA: Diagnosis not present

## 2019-12-27 DIAGNOSIS — J849 Interstitial pulmonary disease, unspecified: Secondary | ICD-10-CM | POA: Diagnosis not present

## 2019-12-28 DIAGNOSIS — F3341 Major depressive disorder, recurrent, in partial remission: Secondary | ICD-10-CM | POA: Diagnosis not present

## 2019-12-28 DIAGNOSIS — J84112 Idiopathic pulmonary fibrosis: Secondary | ICD-10-CM | POA: Diagnosis not present

## 2019-12-28 DIAGNOSIS — M545 Low back pain: Secondary | ICD-10-CM | POA: Diagnosis not present

## 2019-12-28 DIAGNOSIS — K743 Primary biliary cirrhosis: Secondary | ICD-10-CM | POA: Diagnosis not present

## 2019-12-28 DIAGNOSIS — E7800 Pure hypercholesterolemia, unspecified: Secondary | ICD-10-CM | POA: Diagnosis not present

## 2019-12-28 DIAGNOSIS — K219 Gastro-esophageal reflux disease without esophagitis: Secondary | ICD-10-CM | POA: Diagnosis not present

## 2020-01-02 DIAGNOSIS — J84112 Idiopathic pulmonary fibrosis: Secondary | ICD-10-CM | POA: Diagnosis not present

## 2020-01-02 DIAGNOSIS — G4733 Obstructive sleep apnea (adult) (pediatric): Secondary | ICD-10-CM | POA: Diagnosis not present

## 2020-01-02 DIAGNOSIS — G2581 Restless legs syndrome: Secondary | ICD-10-CM | POA: Diagnosis not present

## 2020-01-02 DIAGNOSIS — J4520 Mild intermittent asthma, uncomplicated: Secondary | ICD-10-CM | POA: Diagnosis not present

## 2020-01-02 DIAGNOSIS — J310 Chronic rhinitis: Secondary | ICD-10-CM | POA: Diagnosis not present

## 2020-04-11 IMAGING — US US ABDOMEN COMPLETE
1 series · 14 of 25 positions shown · non-contrast
Comparison: Abdominal ultrasound June 24, 2016

CLINICAL DATA: Biliary cirrhosis, hepatocellular carcinoma
screening, previous cholecystectomy.

EXAM:
ABDOMEN ULTRASOUND COMPLETE

[Series 1: us abdomen complete · 0.22mm/px · 14 of 80 slices shown]
[im 1/80]
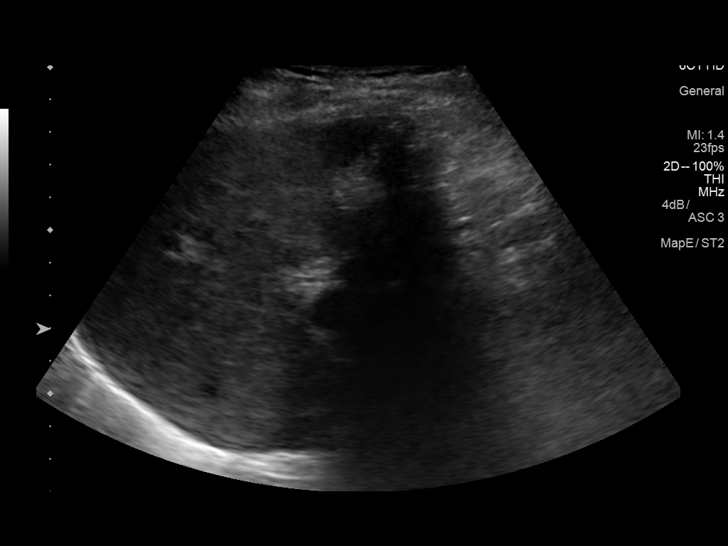
[im 7/80]
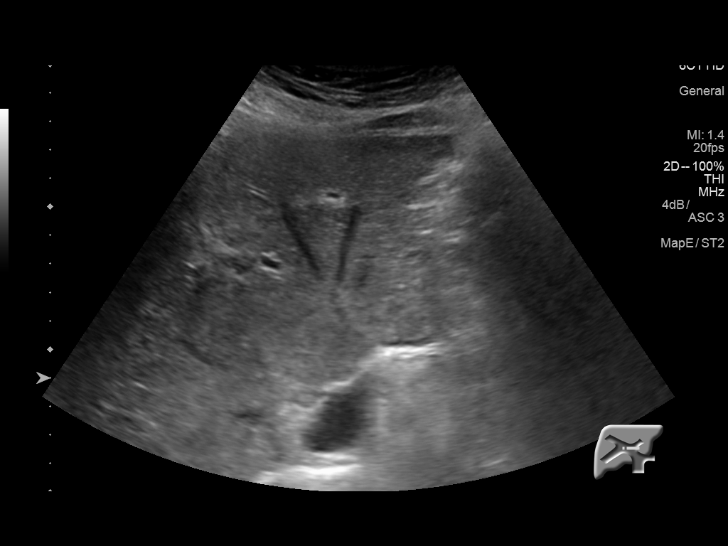
[im 14/80]
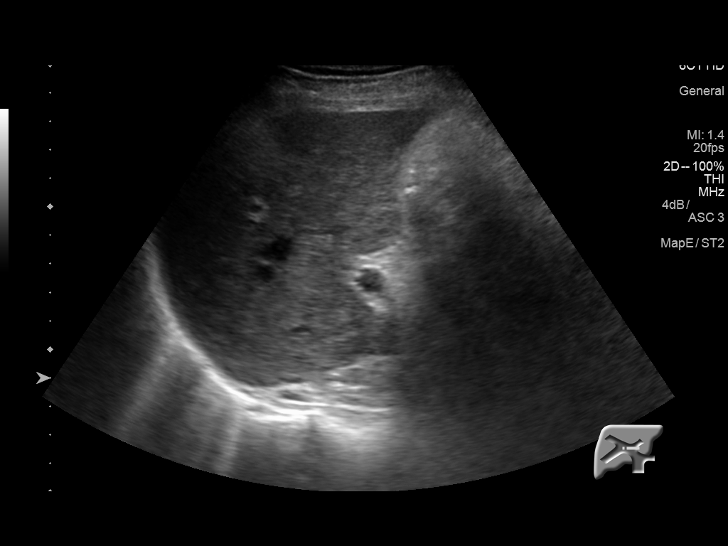
[im 20/80]
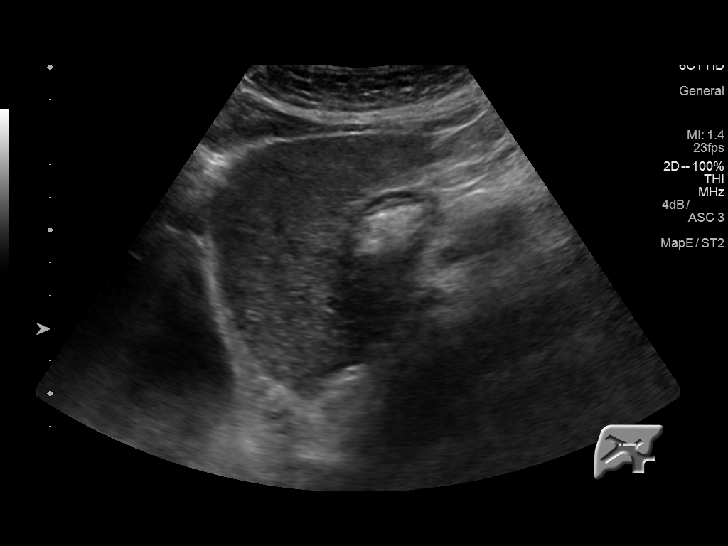
[im 27/80]
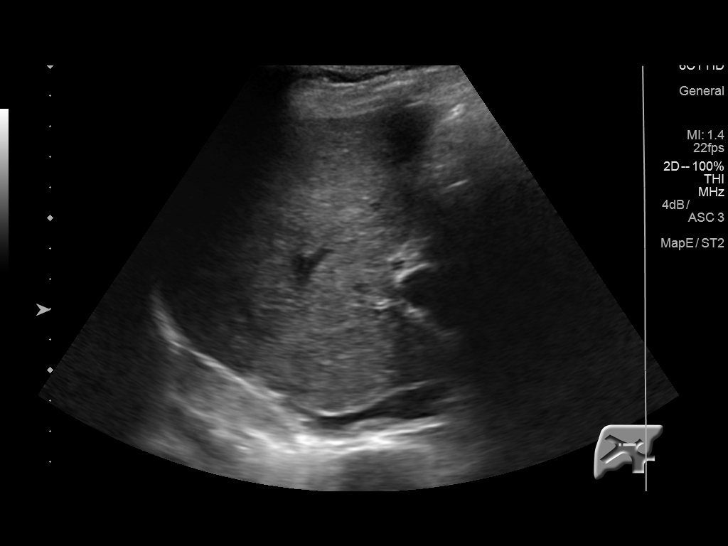
[im 30/80]
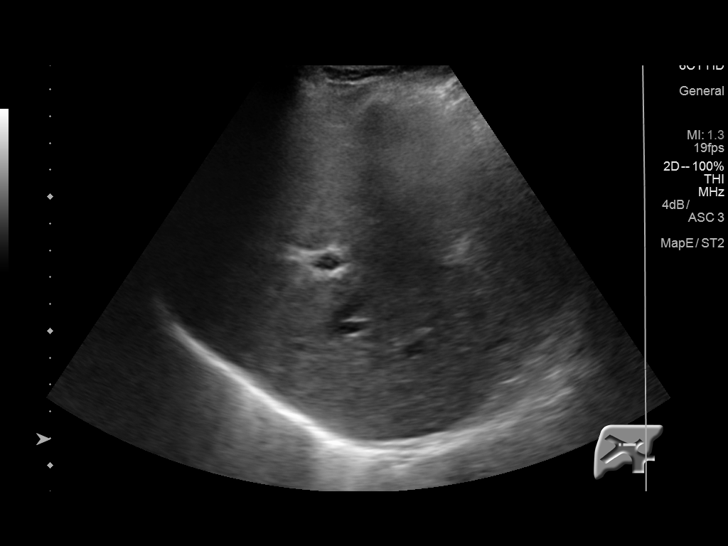
[im 37/80]
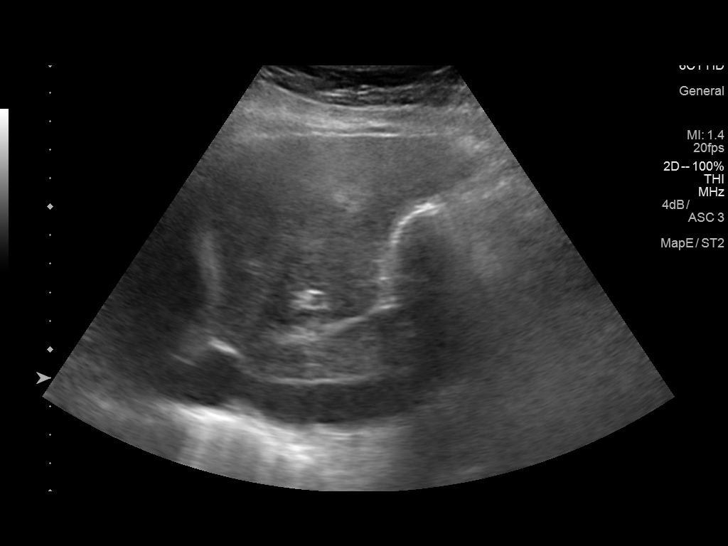
[im 43/80]
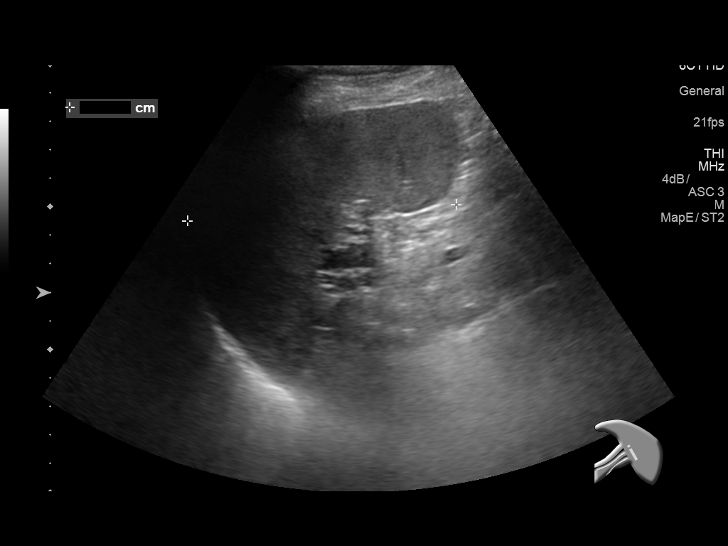
[im 50/80]
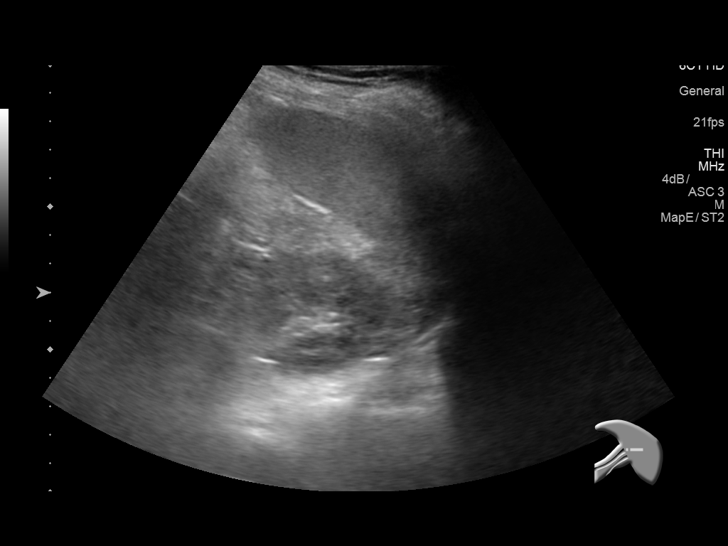
[im 53/80]
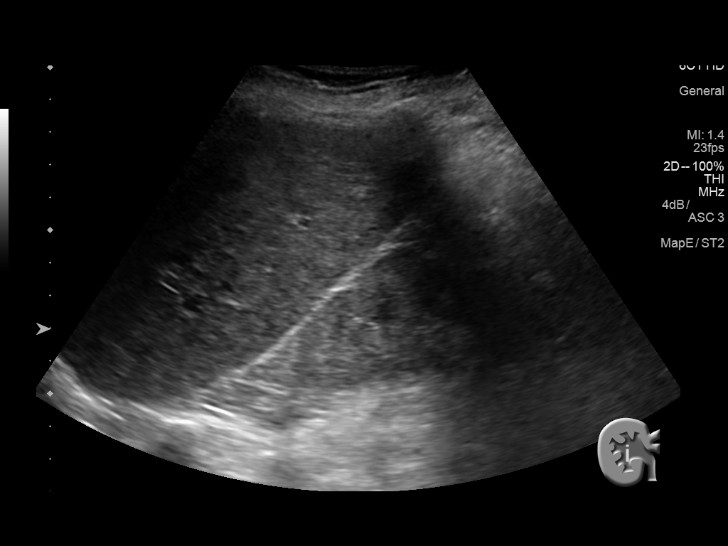
[im 60/80]
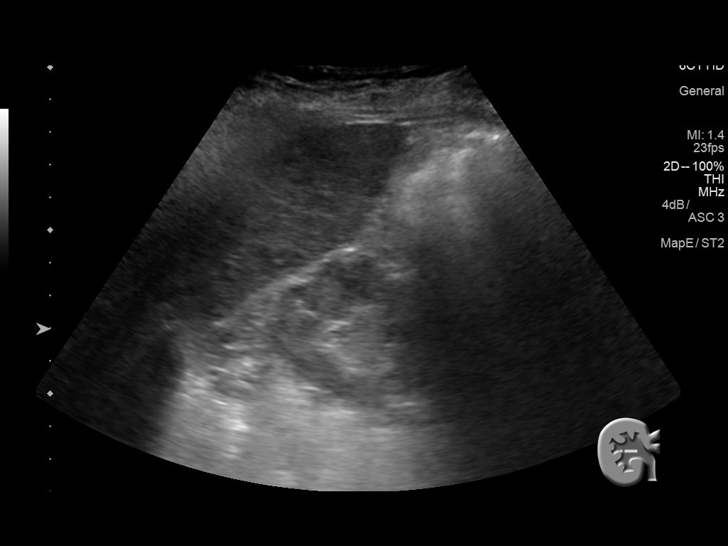
[im 66/80]
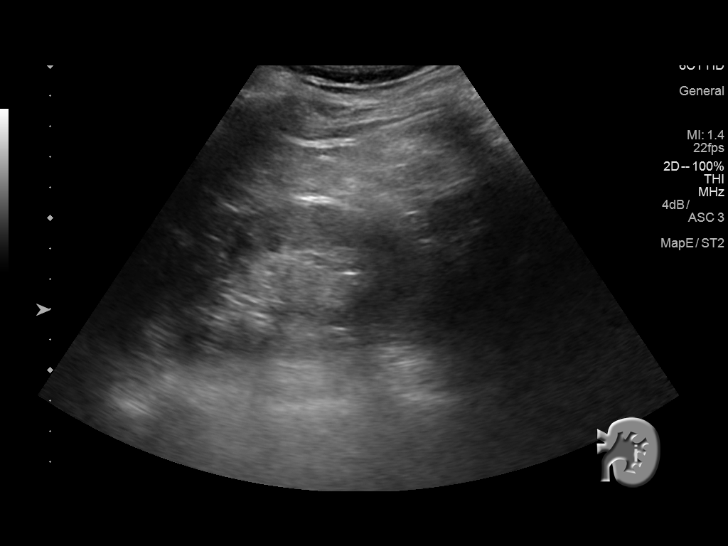
[im 73/80]
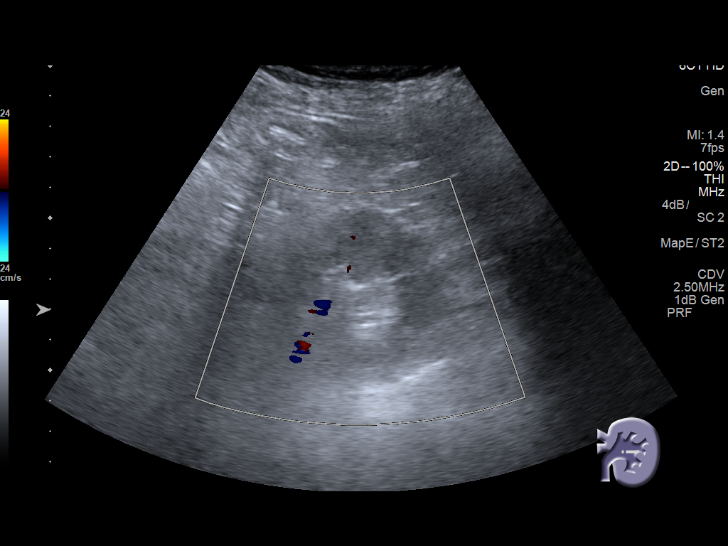
[im 80/80]
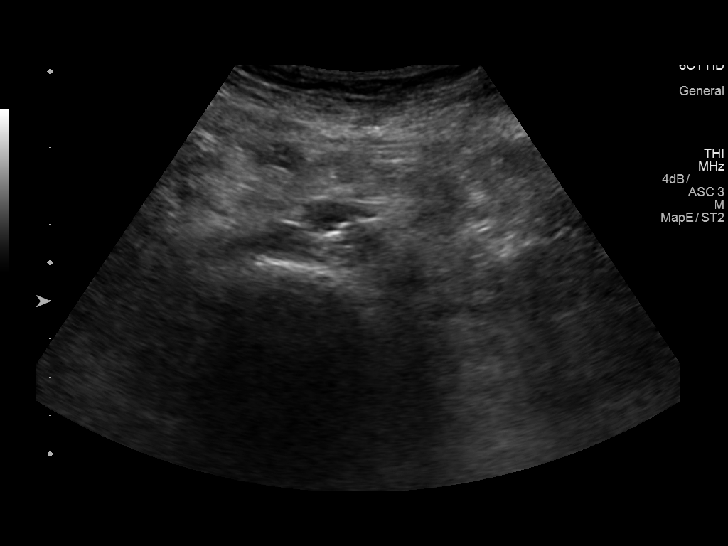

[14 of 25 positions shown; findings below may reference images not displayed]

FINDINGS: Gallbladder: The gallbladder is surgically absent.

Common bile duct: Diameter: 5 mm

Liver: The hepatic echotexture is heterogeneous. There is no
discrete mass or ductal dilation. The surface contour remains
smooth. Portal vein is patent on color Doppler imaging with normal
direction of blood flow towards the liver.

IVC: No abnormality visualized.

Pancreas: Visualized portion unremarkable.

Spleen: The spleen is normal in size and echotexture measuring
cm in length.

Right Kidney: Length: 9.8 cm. Echogenicity within normal limits. No
mass or hydronephrosis visualized.

Left Kidney: Length: 9.3 cm. Echogenicity within normal limits. No
mass or hydronephrosis visualized.

Abdominal aorta: No aneurysm visualized.

Other findings: None.
IMPRESSION: Previous cholecystectomy. Heterogeneous hepatic echotexture without
suspicious masses, surface contour irregularity, or intrahepatic
ductal dilation. No ascites.

No acute intra-abdominal abnormality is observed.

## 2020-04-28 DIAGNOSIS — E785 Hyperlipidemia, unspecified: Secondary | ICD-10-CM | POA: Diagnosis present

## 2020-04-28 DIAGNOSIS — K7460 Unspecified cirrhosis of liver: Secondary | ICD-10-CM | POA: Diagnosis present

## 2020-04-28 DIAGNOSIS — J439 Emphysema, unspecified: Secondary | ICD-10-CM | POA: Diagnosis not present

## 2020-04-28 DIAGNOSIS — R404 Transient alteration of awareness: Secondary | ICD-10-CM | POA: Diagnosis not present

## 2020-04-28 DIAGNOSIS — I868 Varicose veins of other specified sites: Secondary | ICD-10-CM | POA: Diagnosis present

## 2020-04-28 DIAGNOSIS — R410 Disorientation, unspecified: Secondary | ICD-10-CM | POA: Diagnosis not present

## 2020-04-28 DIAGNOSIS — J45901 Unspecified asthma with (acute) exacerbation: Secondary | ICD-10-CM | POA: Diagnosis not present

## 2020-04-28 DIAGNOSIS — B955 Unspecified streptococcus as the cause of diseases classified elsewhere: Secondary | ICD-10-CM | POA: Diagnosis present

## 2020-04-28 DIAGNOSIS — M1990 Unspecified osteoarthritis, unspecified site: Secondary | ICD-10-CM | POA: Diagnosis present

## 2020-04-28 DIAGNOSIS — J181 Lobar pneumonia, unspecified organism: Secondary | ICD-10-CM | POA: Diagnosis not present

## 2020-04-28 DIAGNOSIS — Z043 Encounter for examination and observation following other accident: Secondary | ICD-10-CM | POA: Diagnosis not present

## 2020-04-28 DIAGNOSIS — R531 Weakness: Secondary | ICD-10-CM | POA: Diagnosis not present

## 2020-04-28 DIAGNOSIS — E860 Dehydration: Secondary | ICD-10-CM | POA: Diagnosis not present

## 2020-04-28 DIAGNOSIS — R7881 Bacteremia: Secondary | ICD-10-CM | POA: Diagnosis not present

## 2020-04-28 DIAGNOSIS — F329 Major depressive disorder, single episode, unspecified: Secondary | ICD-10-CM | POA: Diagnosis present

## 2020-04-28 DIAGNOSIS — K219 Gastro-esophageal reflux disease without esophagitis: Secondary | ICD-10-CM | POA: Diagnosis present

## 2020-04-28 DIAGNOSIS — I10 Essential (primary) hypertension: Secondary | ICD-10-CM | POA: Diagnosis present

## 2020-04-28 DIAGNOSIS — F419 Anxiety disorder, unspecified: Secondary | ICD-10-CM | POA: Diagnosis present

## 2020-04-28 DIAGNOSIS — D508 Other iron deficiency anemias: Secondary | ICD-10-CM | POA: Diagnosis present

## 2020-04-28 DIAGNOSIS — J210 Acute bronchiolitis due to respiratory syncytial virus: Secondary | ICD-10-CM | POA: Diagnosis not present

## 2020-04-28 DIAGNOSIS — B974 Respiratory syncytial virus as the cause of diseases classified elsewhere: Secondary | ICD-10-CM | POA: Diagnosis present

## 2020-04-28 DIAGNOSIS — J8410 Pulmonary fibrosis, unspecified: Secondary | ICD-10-CM | POA: Diagnosis not present

## 2020-04-28 DIAGNOSIS — Z9981 Dependence on supplemental oxygen: Secondary | ICD-10-CM | POA: Diagnosis not present

## 2020-04-28 DIAGNOSIS — R42 Dizziness and giddiness: Secondary | ICD-10-CM | POA: Diagnosis not present

## 2020-04-28 DIAGNOSIS — R748 Abnormal levels of other serum enzymes: Secondary | ICD-10-CM | POA: Diagnosis present

## 2020-04-28 DIAGNOSIS — R0902 Hypoxemia: Secondary | ICD-10-CM | POA: Diagnosis not present

## 2020-04-28 DIAGNOSIS — J9611 Chronic respiratory failure with hypoxia: Secondary | ICD-10-CM | POA: Diagnosis not present

## 2020-04-28 DIAGNOSIS — R591 Generalized enlarged lymph nodes: Secondary | ICD-10-CM | POA: Diagnosis present

## 2020-04-28 DIAGNOSIS — I491 Atrial premature depolarization: Secondary | ICD-10-CM | POA: Diagnosis not present

## 2020-04-28 DIAGNOSIS — I4891 Unspecified atrial fibrillation: Secondary | ICD-10-CM | POA: Diagnosis not present

## 2020-04-28 DIAGNOSIS — J189 Pneumonia, unspecified organism: Secondary | ICD-10-CM | POA: Diagnosis not present

## 2020-04-28 DIAGNOSIS — R05 Cough: Secondary | ICD-10-CM | POA: Diagnosis not present

## 2020-05-01 DIAGNOSIS — Z79899 Other long term (current) drug therapy: Secondary | ICD-10-CM | POA: Diagnosis not present

## 2020-05-01 DIAGNOSIS — J9611 Chronic respiratory failure with hypoxia: Secondary | ICD-10-CM | POA: Diagnosis not present

## 2020-05-01 DIAGNOSIS — J8410 Pulmonary fibrosis, unspecified: Secondary | ICD-10-CM | POA: Diagnosis not present

## 2020-05-01 DIAGNOSIS — I252 Old myocardial infarction: Secondary | ICD-10-CM | POA: Diagnosis not present

## 2020-05-01 DIAGNOSIS — B955 Unspecified streptococcus as the cause of diseases classified elsewhere: Secondary | ICD-10-CM | POA: Diagnosis not present

## 2020-05-01 DIAGNOSIS — K219 Gastro-esophageal reflux disease without esophagitis: Secondary | ICD-10-CM | POA: Diagnosis not present

## 2020-05-01 DIAGNOSIS — Z792 Long term (current) use of antibiotics: Secondary | ICD-10-CM | POA: Diagnosis not present

## 2020-05-01 DIAGNOSIS — R531 Weakness: Secondary | ICD-10-CM | POA: Diagnosis not present

## 2020-05-01 DIAGNOSIS — F329 Major depressive disorder, single episode, unspecified: Secondary | ICD-10-CM | POA: Diagnosis not present

## 2020-05-01 DIAGNOSIS — K7460 Unspecified cirrhosis of liver: Secondary | ICD-10-CM | POA: Diagnosis not present

## 2020-05-01 DIAGNOSIS — I10 Essential (primary) hypertension: Secondary | ICD-10-CM | POA: Diagnosis not present

## 2020-05-01 DIAGNOSIS — D508 Other iron deficiency anemias: Secondary | ICD-10-CM | POA: Diagnosis not present

## 2020-05-01 DIAGNOSIS — J210 Acute bronchiolitis due to respiratory syncytial virus: Secondary | ICD-10-CM | POA: Diagnosis not present

## 2020-05-01 DIAGNOSIS — I4891 Unspecified atrial fibrillation: Secondary | ICD-10-CM | POA: Diagnosis not present

## 2020-05-01 DIAGNOSIS — J189 Pneumonia, unspecified organism: Secondary | ICD-10-CM | POA: Diagnosis not present

## 2020-05-01 DIAGNOSIS — M1990 Unspecified osteoarthritis, unspecified site: Secondary | ICD-10-CM | POA: Diagnosis not present

## 2020-05-01 DIAGNOSIS — J439 Emphysema, unspecified: Secondary | ICD-10-CM | POA: Diagnosis not present

## 2020-05-01 DIAGNOSIS — Z9981 Dependence on supplemental oxygen: Secondary | ICD-10-CM | POA: Diagnosis not present

## 2020-05-01 DIAGNOSIS — I868 Varicose veins of other specified sites: Secondary | ICD-10-CM | POA: Diagnosis not present

## 2020-05-01 DIAGNOSIS — R591 Generalized enlarged lymph nodes: Secondary | ICD-10-CM | POA: Diagnosis not present

## 2020-05-01 DIAGNOSIS — J45901 Unspecified asthma with (acute) exacerbation: Secondary | ICD-10-CM | POA: Diagnosis not present

## 2020-05-01 DIAGNOSIS — B974 Respiratory syncytial virus as the cause of diseases classified elsewhere: Secondary | ICD-10-CM | POA: Diagnosis not present

## 2020-05-01 DIAGNOSIS — E785 Hyperlipidemia, unspecified: Secondary | ICD-10-CM | POA: Diagnosis not present

## 2020-05-01 DIAGNOSIS — F419 Anxiety disorder, unspecified: Secondary | ICD-10-CM | POA: Diagnosis not present

## 2020-05-06 DIAGNOSIS — Z79899 Other long term (current) drug therapy: Secondary | ICD-10-CM | POA: Diagnosis not present

## 2020-05-06 DIAGNOSIS — D649 Anemia, unspecified: Secondary | ICD-10-CM | POA: Diagnosis not present

## 2020-05-06 DIAGNOSIS — Z6829 Body mass index (BMI) 29.0-29.9, adult: Secondary | ICD-10-CM | POA: Diagnosis not present

## 2020-05-06 DIAGNOSIS — R591 Generalized enlarged lymph nodes: Secondary | ICD-10-CM | POA: Diagnosis not present

## 2020-05-06 DIAGNOSIS — J189 Pneumonia, unspecified organism: Secondary | ICD-10-CM | POA: Diagnosis not present

## 2020-05-09 DIAGNOSIS — I4891 Unspecified atrial fibrillation: Secondary | ICD-10-CM | POA: Diagnosis not present

## 2020-05-09 DIAGNOSIS — D508 Other iron deficiency anemias: Secondary | ICD-10-CM | POA: Diagnosis not present

## 2020-05-09 DIAGNOSIS — K7460 Unspecified cirrhosis of liver: Secondary | ICD-10-CM | POA: Diagnosis not present

## 2020-05-09 DIAGNOSIS — F329 Major depressive disorder, single episode, unspecified: Secondary | ICD-10-CM | POA: Diagnosis not present

## 2020-05-09 DIAGNOSIS — J189 Pneumonia, unspecified organism: Secondary | ICD-10-CM | POA: Diagnosis not present

## 2020-05-10 DIAGNOSIS — J189 Pneumonia, unspecified organism: Secondary | ICD-10-CM | POA: Diagnosis not present

## 2020-05-10 DIAGNOSIS — I4891 Unspecified atrial fibrillation: Secondary | ICD-10-CM | POA: Diagnosis not present

## 2020-05-10 DIAGNOSIS — F329 Major depressive disorder, single episode, unspecified: Secondary | ICD-10-CM | POA: Diagnosis not present

## 2020-05-10 DIAGNOSIS — K7460 Unspecified cirrhosis of liver: Secondary | ICD-10-CM | POA: Diagnosis not present

## 2020-05-10 DIAGNOSIS — D508 Other iron deficiency anemias: Secondary | ICD-10-CM | POA: Diagnosis not present

## 2020-05-11 DIAGNOSIS — F329 Major depressive disorder, single episode, unspecified: Secondary | ICD-10-CM | POA: Diagnosis not present

## 2020-05-11 DIAGNOSIS — K7460 Unspecified cirrhosis of liver: Secondary | ICD-10-CM | POA: Diagnosis not present

## 2020-05-11 DIAGNOSIS — J189 Pneumonia, unspecified organism: Secondary | ICD-10-CM | POA: Diagnosis not present

## 2020-05-11 DIAGNOSIS — I4891 Unspecified atrial fibrillation: Secondary | ICD-10-CM | POA: Diagnosis not present

## 2020-05-15 DIAGNOSIS — F329 Major depressive disorder, single episode, unspecified: Secondary | ICD-10-CM | POA: Diagnosis not present

## 2020-05-15 DIAGNOSIS — J479 Bronchiectasis, uncomplicated: Secondary | ICD-10-CM | POA: Diagnosis not present

## 2020-05-15 DIAGNOSIS — J189 Pneumonia, unspecified organism: Secondary | ICD-10-CM | POA: Diagnosis not present

## 2020-05-15 DIAGNOSIS — J310 Chronic rhinitis: Secondary | ICD-10-CM | POA: Diagnosis not present

## 2020-05-15 DIAGNOSIS — D508 Other iron deficiency anemias: Secondary | ICD-10-CM | POA: Diagnosis not present

## 2020-05-15 DIAGNOSIS — I4891 Unspecified atrial fibrillation: Secondary | ICD-10-CM | POA: Diagnosis not present

## 2020-05-15 DIAGNOSIS — G2581 Restless legs syndrome: Secondary | ICD-10-CM | POA: Diagnosis not present

## 2020-05-15 DIAGNOSIS — K7460 Unspecified cirrhosis of liver: Secondary | ICD-10-CM | POA: Diagnosis not present

## 2020-05-15 DIAGNOSIS — J84112 Idiopathic pulmonary fibrosis: Secondary | ICD-10-CM | POA: Diagnosis not present

## 2020-05-15 DIAGNOSIS — G4733 Obstructive sleep apnea (adult) (pediatric): Secondary | ICD-10-CM | POA: Diagnosis not present

## 2020-05-15 DIAGNOSIS — J4520 Mild intermittent asthma, uncomplicated: Secondary | ICD-10-CM | POA: Diagnosis not present

## 2020-05-17 DIAGNOSIS — K7460 Unspecified cirrhosis of liver: Secondary | ICD-10-CM | POA: Diagnosis not present

## 2020-05-17 DIAGNOSIS — J189 Pneumonia, unspecified organism: Secondary | ICD-10-CM | POA: Diagnosis not present

## 2020-05-17 DIAGNOSIS — D508 Other iron deficiency anemias: Secondary | ICD-10-CM | POA: Diagnosis not present

## 2020-05-17 DIAGNOSIS — I4891 Unspecified atrial fibrillation: Secondary | ICD-10-CM | POA: Diagnosis not present

## 2020-05-17 DIAGNOSIS — F329 Major depressive disorder, single episode, unspecified: Secondary | ICD-10-CM | POA: Diagnosis not present

## 2020-05-21 DIAGNOSIS — K7460 Unspecified cirrhosis of liver: Secondary | ICD-10-CM | POA: Diagnosis not present

## 2020-05-21 DIAGNOSIS — D508 Other iron deficiency anemias: Secondary | ICD-10-CM | POA: Diagnosis not present

## 2020-05-21 DIAGNOSIS — F329 Major depressive disorder, single episode, unspecified: Secondary | ICD-10-CM | POA: Diagnosis not present

## 2020-05-21 DIAGNOSIS — I4891 Unspecified atrial fibrillation: Secondary | ICD-10-CM | POA: Diagnosis not present

## 2020-05-21 DIAGNOSIS — J189 Pneumonia, unspecified organism: Secondary | ICD-10-CM | POA: Diagnosis not present

## 2020-05-22 DIAGNOSIS — I700 Atherosclerosis of aorta: Secondary | ICD-10-CM | POA: Diagnosis not present

## 2020-05-22 DIAGNOSIS — R918 Other nonspecific abnormal finding of lung field: Secondary | ICD-10-CM | POA: Diagnosis not present

## 2020-05-22 DIAGNOSIS — I517 Cardiomegaly: Secondary | ICD-10-CM | POA: Diagnosis not present

## 2020-05-22 DIAGNOSIS — R591 Generalized enlarged lymph nodes: Secondary | ICD-10-CM | POA: Diagnosis not present

## 2020-05-29 DIAGNOSIS — F329 Major depressive disorder, single episode, unspecified: Secondary | ICD-10-CM | POA: Diagnosis not present

## 2020-05-29 DIAGNOSIS — D508 Other iron deficiency anemias: Secondary | ICD-10-CM | POA: Diagnosis not present

## 2020-05-29 DIAGNOSIS — K7460 Unspecified cirrhosis of liver: Secondary | ICD-10-CM | POA: Diagnosis not present

## 2020-05-29 DIAGNOSIS — I4891 Unspecified atrial fibrillation: Secondary | ICD-10-CM | POA: Diagnosis not present

## 2020-05-29 DIAGNOSIS — J189 Pneumonia, unspecified organism: Secondary | ICD-10-CM | POA: Diagnosis not present

## 2020-05-31 DIAGNOSIS — F329 Major depressive disorder, single episode, unspecified: Secondary | ICD-10-CM | POA: Diagnosis not present

## 2020-05-31 DIAGNOSIS — I10 Essential (primary) hypertension: Secondary | ICD-10-CM | POA: Diagnosis not present

## 2020-05-31 DIAGNOSIS — I4891 Unspecified atrial fibrillation: Secondary | ICD-10-CM | POA: Diagnosis not present

## 2020-05-31 DIAGNOSIS — Z79899 Other long term (current) drug therapy: Secondary | ICD-10-CM | POA: Diagnosis not present

## 2020-05-31 DIAGNOSIS — D508 Other iron deficiency anemias: Secondary | ICD-10-CM | POA: Diagnosis not present

## 2020-05-31 DIAGNOSIS — E785 Hyperlipidemia, unspecified: Secondary | ICD-10-CM | POA: Diagnosis not present

## 2020-05-31 DIAGNOSIS — J45901 Unspecified asthma with (acute) exacerbation: Secondary | ICD-10-CM | POA: Diagnosis not present

## 2020-05-31 DIAGNOSIS — J189 Pneumonia, unspecified organism: Secondary | ICD-10-CM | POA: Diagnosis not present

## 2020-05-31 DIAGNOSIS — Z792 Long term (current) use of antibiotics: Secondary | ICD-10-CM | POA: Diagnosis not present

## 2020-05-31 DIAGNOSIS — M1990 Unspecified osteoarthritis, unspecified site: Secondary | ICD-10-CM | POA: Diagnosis not present

## 2020-05-31 DIAGNOSIS — I252 Old myocardial infarction: Secondary | ICD-10-CM | POA: Diagnosis not present

## 2020-05-31 DIAGNOSIS — Z9981 Dependence on supplemental oxygen: Secondary | ICD-10-CM | POA: Diagnosis not present

## 2020-05-31 DIAGNOSIS — K7460 Unspecified cirrhosis of liver: Secondary | ICD-10-CM | POA: Diagnosis not present

## 2020-05-31 DIAGNOSIS — J210 Acute bronchiolitis due to respiratory syncytial virus: Secondary | ICD-10-CM | POA: Diagnosis not present

## 2020-05-31 DIAGNOSIS — B974 Respiratory syncytial virus as the cause of diseases classified elsewhere: Secondary | ICD-10-CM | POA: Diagnosis not present

## 2020-05-31 DIAGNOSIS — J8410 Pulmonary fibrosis, unspecified: Secondary | ICD-10-CM | POA: Diagnosis not present

## 2020-05-31 DIAGNOSIS — F419 Anxiety disorder, unspecified: Secondary | ICD-10-CM | POA: Diagnosis not present

## 2020-05-31 DIAGNOSIS — I868 Varicose veins of other specified sites: Secondary | ICD-10-CM | POA: Diagnosis not present

## 2020-05-31 DIAGNOSIS — R591 Generalized enlarged lymph nodes: Secondary | ICD-10-CM | POA: Diagnosis not present

## 2020-05-31 DIAGNOSIS — K219 Gastro-esophageal reflux disease without esophagitis: Secondary | ICD-10-CM | POA: Diagnosis not present

## 2020-05-31 DIAGNOSIS — J439 Emphysema, unspecified: Secondary | ICD-10-CM | POA: Diagnosis not present

## 2020-05-31 DIAGNOSIS — J9611 Chronic respiratory failure with hypoxia: Secondary | ICD-10-CM | POA: Diagnosis not present

## 2020-05-31 DIAGNOSIS — B955 Unspecified streptococcus as the cause of diseases classified elsewhere: Secondary | ICD-10-CM | POA: Diagnosis not present

## 2020-05-31 DIAGNOSIS — R531 Weakness: Secondary | ICD-10-CM | POA: Diagnosis not present

## 2020-06-10 DIAGNOSIS — J9611 Chronic respiratory failure with hypoxia: Secondary | ICD-10-CM | POA: Diagnosis not present

## 2020-06-10 DIAGNOSIS — J84112 Idiopathic pulmonary fibrosis: Secondary | ICD-10-CM | POA: Diagnosis not present

## 2020-06-10 DIAGNOSIS — D508 Other iron deficiency anemias: Secondary | ICD-10-CM | POA: Diagnosis not present

## 2020-06-10 DIAGNOSIS — F329 Major depressive disorder, single episode, unspecified: Secondary | ICD-10-CM | POA: Diagnosis not present

## 2020-06-10 DIAGNOSIS — I4891 Unspecified atrial fibrillation: Secondary | ICD-10-CM | POA: Diagnosis not present

## 2020-06-10 DIAGNOSIS — K7460 Unspecified cirrhosis of liver: Secondary | ICD-10-CM | POA: Diagnosis not present

## 2020-06-10 DIAGNOSIS — J189 Pneumonia, unspecified organism: Secondary | ICD-10-CM | POA: Diagnosis not present

## 2020-06-10 DIAGNOSIS — R769 Abnormal immunological finding in serum, unspecified: Secondary | ICD-10-CM | POA: Diagnosis not present

## 2020-06-10 DIAGNOSIS — R591 Generalized enlarged lymph nodes: Secondary | ICD-10-CM | POA: Diagnosis not present

## 2020-06-14 DIAGNOSIS — F329 Major depressive disorder, single episode, unspecified: Secondary | ICD-10-CM | POA: Diagnosis not present

## 2020-06-14 DIAGNOSIS — I4891 Unspecified atrial fibrillation: Secondary | ICD-10-CM | POA: Diagnosis not present

## 2020-06-14 DIAGNOSIS — K7460 Unspecified cirrhosis of liver: Secondary | ICD-10-CM | POA: Diagnosis not present

## 2020-06-14 DIAGNOSIS — D508 Other iron deficiency anemias: Secondary | ICD-10-CM | POA: Diagnosis not present

## 2020-06-14 DIAGNOSIS — J189 Pneumonia, unspecified organism: Secondary | ICD-10-CM | POA: Diagnosis not present

## 2020-06-17 DIAGNOSIS — G4733 Obstructive sleep apnea (adult) (pediatric): Secondary | ICD-10-CM | POA: Diagnosis not present

## 2020-06-19 DIAGNOSIS — I4891 Unspecified atrial fibrillation: Secondary | ICD-10-CM | POA: Diagnosis not present

## 2020-06-19 DIAGNOSIS — K7460 Unspecified cirrhosis of liver: Secondary | ICD-10-CM | POA: Diagnosis not present

## 2020-06-19 DIAGNOSIS — F329 Major depressive disorder, single episode, unspecified: Secondary | ICD-10-CM | POA: Diagnosis not present

## 2020-06-19 DIAGNOSIS — D508 Other iron deficiency anemias: Secondary | ICD-10-CM | POA: Diagnosis not present

## 2020-06-19 DIAGNOSIS — J189 Pneumonia, unspecified organism: Secondary | ICD-10-CM | POA: Diagnosis not present

## 2020-06-24 DIAGNOSIS — J4520 Mild intermittent asthma, uncomplicated: Secondary | ICD-10-CM | POA: Diagnosis not present

## 2020-06-24 DIAGNOSIS — J84112 Idiopathic pulmonary fibrosis: Secondary | ICD-10-CM | POA: Diagnosis not present

## 2020-06-24 DIAGNOSIS — G2581 Restless legs syndrome: Secondary | ICD-10-CM | POA: Diagnosis not present

## 2020-06-24 DIAGNOSIS — Z23 Encounter for immunization: Secondary | ICD-10-CM | POA: Diagnosis not present

## 2020-06-24 DIAGNOSIS — J310 Chronic rhinitis: Secondary | ICD-10-CM | POA: Diagnosis not present

## 2020-06-24 DIAGNOSIS — J479 Bronchiectasis, uncomplicated: Secondary | ICD-10-CM | POA: Diagnosis not present

## 2020-06-24 DIAGNOSIS — G4733 Obstructive sleep apnea (adult) (pediatric): Secondary | ICD-10-CM | POA: Diagnosis not present

## 2020-07-01 DIAGNOSIS — R945 Abnormal results of liver function studies: Secondary | ICD-10-CM | POA: Diagnosis not present

## 2020-07-18 DIAGNOSIS — K2900 Acute gastritis without bleeding: Secondary | ICD-10-CM | POA: Diagnosis not present

## 2020-07-18 DIAGNOSIS — Z79899 Other long term (current) drug therapy: Secondary | ICD-10-CM | POA: Diagnosis not present

## 2020-07-18 DIAGNOSIS — R079 Chest pain, unspecified: Secondary | ICD-10-CM | POA: Diagnosis not present

## 2020-07-18 DIAGNOSIS — R531 Weakness: Secondary | ICD-10-CM | POA: Diagnosis not present

## 2020-07-18 DIAGNOSIS — I361 Nonrheumatic tricuspid (valve) insufficiency: Secondary | ICD-10-CM | POA: Diagnosis not present

## 2020-07-18 DIAGNOSIS — T50905A Adverse effect of unspecified drugs, medicaments and biological substances, initial encounter: Secondary | ICD-10-CM | POA: Diagnosis not present

## 2020-07-18 DIAGNOSIS — I498 Other specified cardiac arrhythmias: Secondary | ICD-10-CM | POA: Diagnosis present

## 2020-07-18 DIAGNOSIS — E785 Hyperlipidemia, unspecified: Secondary | ICD-10-CM | POA: Diagnosis not present

## 2020-07-18 DIAGNOSIS — R778 Other specified abnormalities of plasma proteins: Secondary | ICD-10-CM | POA: Diagnosis not present

## 2020-07-18 DIAGNOSIS — N183 Chronic kidney disease, stage 3 unspecified: Secondary | ICD-10-CM | POA: Diagnosis present

## 2020-07-18 DIAGNOSIS — I2510 Atherosclerotic heart disease of native coronary artery without angina pectoris: Secondary | ICD-10-CM | POA: Diagnosis present

## 2020-07-18 DIAGNOSIS — I10 Essential (primary) hypertension: Secondary | ICD-10-CM | POA: Diagnosis not present

## 2020-07-18 DIAGNOSIS — G2581 Restless legs syndrome: Secondary | ICD-10-CM | POA: Diagnosis not present

## 2020-07-18 DIAGNOSIS — R410 Disorientation, unspecified: Secondary | ICD-10-CM | POA: Diagnosis not present

## 2020-07-18 DIAGNOSIS — I129 Hypertensive chronic kidney disease with stage 1 through stage 4 chronic kidney disease, or unspecified chronic kidney disease: Secondary | ICD-10-CM | POA: Diagnosis present

## 2020-07-18 DIAGNOSIS — J0120 Acute ethmoidal sinusitis, unspecified: Secondary | ICD-10-CM | POA: Diagnosis not present

## 2020-07-18 DIAGNOSIS — J90 Pleural effusion, not elsewhere classified: Secondary | ICD-10-CM | POA: Diagnosis not present

## 2020-07-18 DIAGNOSIS — I252 Old myocardial infarction: Secondary | ICD-10-CM | POA: Diagnosis not present

## 2020-07-18 DIAGNOSIS — J9811 Atelectasis: Secondary | ICD-10-CM | POA: Diagnosis not present

## 2020-07-18 DIAGNOSIS — F23 Brief psychotic disorder: Secondary | ICD-10-CM | POA: Diagnosis not present

## 2020-07-18 DIAGNOSIS — R4182 Altered mental status, unspecified: Secondary | ICD-10-CM | POA: Diagnosis not present

## 2020-07-18 DIAGNOSIS — M1990 Unspecified osteoarthritis, unspecified site: Secondary | ICD-10-CM | POA: Diagnosis present

## 2020-07-18 DIAGNOSIS — J449 Chronic obstructive pulmonary disease, unspecified: Secondary | ICD-10-CM | POA: Diagnosis not present

## 2020-07-18 DIAGNOSIS — Z8719 Personal history of other diseases of the digestive system: Secondary | ICD-10-CM | POA: Diagnosis not present

## 2020-07-18 DIAGNOSIS — J8410 Pulmonary fibrosis, unspecified: Secondary | ICD-10-CM | POA: Diagnosis present

## 2020-07-18 DIAGNOSIS — R1013 Epigastric pain: Secondary | ICD-10-CM | POA: Diagnosis not present

## 2020-07-18 DIAGNOSIS — R400 Somnolence: Secondary | ICD-10-CM | POA: Diagnosis not present

## 2020-07-18 DIAGNOSIS — G319 Degenerative disease of nervous system, unspecified: Secondary | ICD-10-CM | POA: Diagnosis not present

## 2020-07-18 DIAGNOSIS — J322 Chronic ethmoidal sinusitis: Secondary | ICD-10-CM | POA: Diagnosis not present

## 2020-07-18 DIAGNOSIS — K219 Gastro-esophageal reflux disease without esophagitis: Secondary | ICD-10-CM | POA: Diagnosis present

## 2020-07-18 DIAGNOSIS — D649 Anemia, unspecified: Secondary | ICD-10-CM | POA: Diagnosis present

## 2020-07-18 DIAGNOSIS — F418 Other specified anxiety disorders: Secondary | ICD-10-CM | POA: Diagnosis present

## 2020-07-18 DIAGNOSIS — I4891 Unspecified atrial fibrillation: Secondary | ICD-10-CM | POA: Diagnosis present

## 2020-07-18 DIAGNOSIS — J811 Chronic pulmonary edema: Secondary | ICD-10-CM | POA: Diagnosis not present

## 2020-07-18 DIAGNOSIS — I248 Other forms of acute ischemic heart disease: Secondary | ICD-10-CM | POA: Diagnosis not present

## 2020-07-18 DIAGNOSIS — Z7952 Long term (current) use of systemic steroids: Secondary | ICD-10-CM | POA: Diagnosis not present

## 2020-07-19 DIAGNOSIS — I248 Other forms of acute ischemic heart disease: Secondary | ICD-10-CM | POA: Diagnosis not present

## 2020-07-19 DIAGNOSIS — R778 Other specified abnormalities of plasma proteins: Secondary | ICD-10-CM

## 2020-07-19 DIAGNOSIS — R4182 Altered mental status, unspecified: Secondary | ICD-10-CM | POA: Diagnosis not present

## 2020-07-19 DIAGNOSIS — J9811 Atelectasis: Secondary | ICD-10-CM | POA: Diagnosis not present

## 2020-07-19 DIAGNOSIS — J0120 Acute ethmoidal sinusitis, unspecified: Secondary | ICD-10-CM | POA: Diagnosis not present

## 2020-07-19 DIAGNOSIS — R410 Disorientation, unspecified: Secondary | ICD-10-CM | POA: Diagnosis not present

## 2020-07-19 DIAGNOSIS — I498 Other specified cardiac arrhythmias: Secondary | ICD-10-CM | POA: Diagnosis not present

## 2020-07-19 DIAGNOSIS — T50905A Adverse effect of unspecified drugs, medicaments and biological substances, initial encounter: Secondary | ICD-10-CM | POA: Diagnosis not present

## 2020-07-19 DIAGNOSIS — J90 Pleural effusion, not elsewhere classified: Secondary | ICD-10-CM | POA: Diagnosis not present

## 2020-07-19 DIAGNOSIS — J322 Chronic ethmoidal sinusitis: Secondary | ICD-10-CM | POA: Diagnosis not present

## 2020-07-19 DIAGNOSIS — F23 Brief psychotic disorder: Secondary | ICD-10-CM | POA: Diagnosis not present

## 2020-07-19 DIAGNOSIS — I361 Nonrheumatic tricuspid (valve) insufficiency: Secondary | ICD-10-CM

## 2020-07-19 DIAGNOSIS — G319 Degenerative disease of nervous system, unspecified: Secondary | ICD-10-CM | POA: Diagnosis not present

## 2020-07-20 DIAGNOSIS — F23 Brief psychotic disorder: Secondary | ICD-10-CM | POA: Diagnosis not present

## 2020-07-20 DIAGNOSIS — R410 Disorientation, unspecified: Secondary | ICD-10-CM

## 2020-07-20 DIAGNOSIS — I248 Other forms of acute ischemic heart disease: Secondary | ICD-10-CM | POA: Diagnosis not present

## 2020-07-20 DIAGNOSIS — R778 Other specified abnormalities of plasma proteins: Secondary | ICD-10-CM

## 2020-07-20 DIAGNOSIS — I10 Essential (primary) hypertension: Secondary | ICD-10-CM

## 2020-07-20 DIAGNOSIS — T50905A Adverse effect of unspecified drugs, medicaments and biological substances, initial encounter: Secondary | ICD-10-CM | POA: Diagnosis not present

## 2020-07-20 DIAGNOSIS — I498 Other specified cardiac arrhythmias: Secondary | ICD-10-CM

## 2020-07-21 DIAGNOSIS — T50905A Adverse effect of unspecified drugs, medicaments and biological substances, initial encounter: Secondary | ICD-10-CM | POA: Diagnosis not present

## 2020-07-21 DIAGNOSIS — I248 Other forms of acute ischemic heart disease: Secondary | ICD-10-CM | POA: Diagnosis not present

## 2020-07-21 DIAGNOSIS — F23 Brief psychotic disorder: Secondary | ICD-10-CM | POA: Diagnosis not present

## 2020-07-25 DIAGNOSIS — J159 Unspecified bacterial pneumonia: Secondary | ICD-10-CM | POA: Diagnosis not present

## 2020-07-25 DIAGNOSIS — I10 Essential (primary) hypertension: Secondary | ICD-10-CM | POA: Diagnosis not present

## 2020-07-25 DIAGNOSIS — I4891 Unspecified atrial fibrillation: Secondary | ICD-10-CM | POA: Diagnosis not present

## 2020-07-25 DIAGNOSIS — G2581 Restless legs syndrome: Secondary | ICD-10-CM | POA: Diagnosis not present

## 2020-07-25 DIAGNOSIS — F23 Brief psychotic disorder: Secondary | ICD-10-CM | POA: Diagnosis not present

## 2020-07-25 DIAGNOSIS — T50905A Adverse effect of unspecified drugs, medicaments and biological substances, initial encounter: Secondary | ICD-10-CM | POA: Diagnosis not present

## 2020-07-25 DIAGNOSIS — R051 Acute cough: Secondary | ICD-10-CM | POA: Diagnosis not present

## 2020-07-26 DIAGNOSIS — I480 Paroxysmal atrial fibrillation: Secondary | ICD-10-CM | POA: Diagnosis not present

## 2020-07-29 DIAGNOSIS — R591 Generalized enlarged lymph nodes: Secondary | ICD-10-CM | POA: Diagnosis not present

## 2020-07-29 DIAGNOSIS — F324 Major depressive disorder, single episode, in partial remission: Secondary | ICD-10-CM | POA: Diagnosis not present

## 2020-07-29 DIAGNOSIS — J159 Unspecified bacterial pneumonia: Secondary | ICD-10-CM | POA: Diagnosis not present

## 2020-07-29 DIAGNOSIS — D649 Anemia, unspecified: Secondary | ICD-10-CM | POA: Diagnosis not present

## 2020-07-29 DIAGNOSIS — Z0000 Encounter for general adult medical examination without abnormal findings: Secondary | ICD-10-CM | POA: Diagnosis not present

## 2020-07-29 DIAGNOSIS — M810 Age-related osteoporosis without current pathological fracture: Secondary | ICD-10-CM | POA: Diagnosis not present

## 2020-07-29 DIAGNOSIS — Z6829 Body mass index (BMI) 29.0-29.9, adult: Secondary | ICD-10-CM | POA: Diagnosis not present

## 2020-07-29 DIAGNOSIS — I480 Paroxysmal atrial fibrillation: Secondary | ICD-10-CM | POA: Diagnosis not present

## 2020-07-29 DIAGNOSIS — H5330 Unspecified disorder of binocular vision: Secondary | ICD-10-CM | POA: Diagnosis not present

## 2020-07-29 DIAGNOSIS — Z9181 History of falling: Secondary | ICD-10-CM | POA: Diagnosis not present

## 2020-08-03 DIAGNOSIS — D649 Anemia, unspecified: Secondary | ICD-10-CM | POA: Diagnosis not present

## 2020-08-03 DIAGNOSIS — I4891 Unspecified atrial fibrillation: Secondary | ICD-10-CM | POA: Diagnosis not present

## 2020-08-03 DIAGNOSIS — R079 Chest pain, unspecified: Secondary | ICD-10-CM | POA: Diagnosis not present

## 2020-08-03 DIAGNOSIS — M79605 Pain in left leg: Secondary | ICD-10-CM | POA: Diagnosis not present

## 2020-08-03 DIAGNOSIS — M549 Dorsalgia, unspecified: Secondary | ICD-10-CM | POA: Diagnosis not present

## 2020-08-03 DIAGNOSIS — R748 Abnormal levels of other serum enzymes: Secondary | ICD-10-CM | POA: Diagnosis not present

## 2020-08-03 DIAGNOSIS — M79604 Pain in right leg: Secondary | ICD-10-CM | POA: Diagnosis not present

## 2020-08-03 DIAGNOSIS — R0602 Shortness of breath: Secondary | ICD-10-CM | POA: Diagnosis not present

## 2020-08-03 DIAGNOSIS — R001 Bradycardia, unspecified: Secondary | ICD-10-CM | POA: Diagnosis not present

## 2020-08-03 DIAGNOSIS — J90 Pleural effusion, not elsewhere classified: Secondary | ICD-10-CM | POA: Diagnosis not present

## 2020-08-05 DIAGNOSIS — R945 Abnormal results of liver function studies: Secondary | ICD-10-CM | POA: Diagnosis not present

## 2020-08-05 DIAGNOSIS — J479 Bronchiectasis, uncomplicated: Secondary | ICD-10-CM | POA: Diagnosis not present

## 2020-08-05 DIAGNOSIS — J310 Chronic rhinitis: Secondary | ICD-10-CM | POA: Diagnosis not present

## 2020-08-05 DIAGNOSIS — G2581 Restless legs syndrome: Secondary | ICD-10-CM | POA: Diagnosis not present

## 2020-08-05 DIAGNOSIS — J84112 Idiopathic pulmonary fibrosis: Secondary | ICD-10-CM | POA: Diagnosis not present

## 2020-08-05 DIAGNOSIS — R001 Bradycardia, unspecified: Secondary | ICD-10-CM | POA: Diagnosis not present

## 2020-08-05 DIAGNOSIS — G4733 Obstructive sleep apnea (adult) (pediatric): Secondary | ICD-10-CM | POA: Diagnosis not present

## 2020-08-05 DIAGNOSIS — J4520 Mild intermittent asthma, uncomplicated: Secondary | ICD-10-CM | POA: Diagnosis not present

## 2020-08-06 DIAGNOSIS — R911 Solitary pulmonary nodule: Secondary | ICD-10-CM | POA: Insufficient documentation

## 2020-08-13 DIAGNOSIS — J84112 Idiopathic pulmonary fibrosis: Secondary | ICD-10-CM | POA: Diagnosis not present

## 2020-08-13 DIAGNOSIS — R5383 Other fatigue: Secondary | ICD-10-CM | POA: Diagnosis not present

## 2020-08-13 DIAGNOSIS — R0600 Dyspnea, unspecified: Secondary | ICD-10-CM | POA: Diagnosis not present

## 2020-08-20 ENCOUNTER — Other Ambulatory Visit: Payer: Self-pay

## 2020-08-20 DIAGNOSIS — Z972 Presence of dental prosthetic device (complete) (partial): Secondary | ICD-10-CM | POA: Insufficient documentation

## 2020-08-20 DIAGNOSIS — M797 Fibromyalgia: Secondary | ICD-10-CM | POA: Insufficient documentation

## 2020-08-20 DIAGNOSIS — K219 Gastro-esophageal reflux disease without esophagitis: Secondary | ICD-10-CM | POA: Insufficient documentation

## 2020-08-20 DIAGNOSIS — R202 Paresthesia of skin: Secondary | ICD-10-CM | POA: Insufficient documentation

## 2020-08-20 DIAGNOSIS — I959 Hypotension, unspecified: Secondary | ICD-10-CM | POA: Insufficient documentation

## 2020-08-20 DIAGNOSIS — J8410 Pulmonary fibrosis, unspecified: Secondary | ICD-10-CM | POA: Insufficient documentation

## 2020-08-20 DIAGNOSIS — Z9981 Dependence on supplemental oxygen: Secondary | ICD-10-CM | POA: Insufficient documentation

## 2020-08-20 DIAGNOSIS — Z8669 Personal history of other diseases of the nervous system and sense organs: Secondary | ICD-10-CM | POA: Insufficient documentation

## 2020-08-20 DIAGNOSIS — K5520 Angiodysplasia of colon without hemorrhage: Secondary | ICD-10-CM | POA: Insufficient documentation

## 2020-08-20 DIAGNOSIS — J45909 Unspecified asthma, uncomplicated: Secondary | ICD-10-CM | POA: Insufficient documentation

## 2020-08-20 DIAGNOSIS — E785 Hyperlipidemia, unspecified: Secondary | ICD-10-CM | POA: Insufficient documentation

## 2020-08-20 DIAGNOSIS — R000 Tachycardia, unspecified: Secondary | ICD-10-CM | POA: Insufficient documentation

## 2020-08-20 DIAGNOSIS — K745 Biliary cirrhosis, unspecified: Secondary | ICD-10-CM | POA: Insufficient documentation

## 2020-08-20 DIAGNOSIS — K922 Gastrointestinal hemorrhage, unspecified: Secondary | ICD-10-CM | POA: Insufficient documentation

## 2020-08-20 DIAGNOSIS — J189 Pneumonia, unspecified organism: Secondary | ICD-10-CM | POA: Insufficient documentation

## 2020-08-20 DIAGNOSIS — M810 Age-related osteoporosis without current pathological fracture: Secondary | ICD-10-CM | POA: Insufficient documentation

## 2020-08-20 DIAGNOSIS — F32A Depression, unspecified: Secondary | ICD-10-CM | POA: Insufficient documentation

## 2020-08-20 DIAGNOSIS — R519 Headache, unspecified: Secondary | ICD-10-CM | POA: Insufficient documentation

## 2020-08-20 DIAGNOSIS — F419 Anxiety disorder, unspecified: Secondary | ICD-10-CM | POA: Insufficient documentation

## 2020-08-20 DIAGNOSIS — Z8489 Family history of other specified conditions: Secondary | ICD-10-CM | POA: Insufficient documentation

## 2020-08-20 DIAGNOSIS — G4730 Sleep apnea, unspecified: Secondary | ICD-10-CM | POA: Insufficient documentation

## 2020-08-20 DIAGNOSIS — D649 Anemia, unspecified: Secondary | ICD-10-CM | POA: Insufficient documentation

## 2020-08-20 DIAGNOSIS — Z973 Presence of spectacles and contact lenses: Secondary | ICD-10-CM | POA: Insufficient documentation

## 2020-08-20 DIAGNOSIS — K8020 Calculus of gallbladder without cholecystitis without obstruction: Secondary | ICD-10-CM | POA: Insufficient documentation

## 2020-08-20 DIAGNOSIS — K7460 Unspecified cirrhosis of liver: Secondary | ICD-10-CM | POA: Insufficient documentation

## 2020-08-21 ENCOUNTER — Ambulatory Visit (INDEPENDENT_AMBULATORY_CARE_PROVIDER_SITE_OTHER): Payer: Medicare Other | Admitting: Cardiology

## 2020-08-21 ENCOUNTER — Encounter: Payer: Self-pay | Admitting: Cardiology

## 2020-08-21 ENCOUNTER — Other Ambulatory Visit: Payer: Self-pay

## 2020-08-21 ENCOUNTER — Telehealth: Payer: Self-pay | Admitting: *Deleted

## 2020-08-21 VITALS — BP 110/62 | HR 51 | Ht 62.0 in | Wt 154.2 lb

## 2020-08-21 DIAGNOSIS — I498 Other specified cardiac arrhythmias: Secondary | ICD-10-CM | POA: Diagnosis not present

## 2020-08-21 DIAGNOSIS — R001 Bradycardia, unspecified: Secondary | ICD-10-CM

## 2020-08-21 DIAGNOSIS — R42 Dizziness and giddiness: Secondary | ICD-10-CM | POA: Diagnosis not present

## 2020-08-21 DIAGNOSIS — G4733 Obstructive sleep apnea (adult) (pediatric): Secondary | ICD-10-CM | POA: Diagnosis not present

## 2020-08-21 DIAGNOSIS — R072 Precordial pain: Secondary | ICD-10-CM | POA: Diagnosis not present

## 2020-08-21 NOTE — Telephone Encounter (Signed)
Patient's son Herbie [place] was given per DPR detailed instructions per Myocardial Perfusion Study Information Sheet for the test on 08/29/20 at 0815. Patient notified to arrive 15 minutes early and that it is imperative to arrive on time for appointment to keep from having the test rescheduled.  If you need to cancel or reschedule your appointment, please call the office within 24 hours of your appointment. . Patient verbalized understanding.Cindia Hustead, Ranae Palms No mychart available

## 2020-08-21 NOTE — Progress Notes (Signed)
Cardiology Office Note:    Date:  08/21/2020   ID:  Sherry Wang, DOB 10-14-1940, MRN [id number]  PCP:  Greig Right, MD  Cardiologist:  Berniece Salines, DO  Electrophysiologist:  None   Referring MD: Greig Right, MD   " my heart rate has been slow and have been feeling really dizzy"   History of Present Illness:    Sherry Wang is a 80 y.o. 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  The patient is here today to establish cardiac care.  She is with her son he tells me that over the last few weeks he has had worsening heart rates that her heart rate is going into the 30s.  She has had some dizziness but has not passed out.  Of note she was hospitalized in [facility] in December at which time she had an echocardiogram which reported hyperdynamic LV function mitral Regurgitation mild pulmonary artery hypertension grade 1 diastolic dysfunction.  Past Medical History:  Diagnosis Date  . Anemia   . Anemia, iron deficiency 11/28/2014  . Anxiety   . Asthma   . AVM (arteriovenous malformation) of small bowel, acquired   . Biliary cirrhosis ([place])   . Cirrhosis ([place])   . Depression   . Dyspnea 02/14/2019  . Family history of adverse reaction to anesthesia    sister had hysteria after anesthesia  . Fast heart beat    occ  . Fibromyalgia   . Gallstones   . GERD (gastroesophageal reflux disease)   . GI bleeding   . Headache   . History of cataract   . History of home oxygen therapy    2 liters at hs only  . Hyperlipemia   . Junctional bradycardia   . Low blood pressure   . Non-Q wave non-ST elevation myocardial infarction (NSTEMI) ([place]) 2004  . Osteoporosis   . Pneumonia    last 2016  . Primary biliary cirrhosis ([place]) 11/28/2014   . Pulmonary fibrosis ([place])   . Sleep apnea    has had sleep study  . Tingling    left foot sing back surgery   . Wears glasses    Reading  . Wears partial dentures    lower    Past Surgical History:  Procedure Laterality Date  . CARDIAC CATHETERIZATION  2004  . CATARACT EXTRACTION, BILATERAL    . CHOLECYSTECTOMY    . COLONOSCOPY  11/08/2008  . COLONOSCOPY WITH PROPOFOL N/A 02/21/2019   Procedure: COLONOSCOPY WITH PROPOFOL;  Surgeon: Mauri Pole, MD;  Location: [facility] END[facility]COPY;  Service: Endoscopy;  Laterality: N/A;  . ESOPHAGOGASTRODUODENOSCOPY (EGD) WITH PROPOFOL N/A 02/02/2017   Procedure: ESOPHAGOGASTRODUODENOSCOPY (EGD) WITH PROPOFOL;  Surgeon: Mauri Pole, MD;  Location: [facility]OSCOPY;  Service: Endoscopy;  Laterality: N/A;  . ESOPHAGOGASTRODUODENOSCOPY (EGD) WITH PROPOFOL N/A 02/21/2019   Procedure: ESOPHAGOGASTRODUODENOSCOPY (EGD) WITH PROPOFOL;  Surgeon: Mauri Pole, MD;  Location: [facility]OSCOPY;  Service: Endoscopy;  Laterality: N/A;  . LAMINECTOMY  age 52  . SHOULDER SURGERY Left   . TOE SURGERY Right   . TONSILLECTOMY     as a child  . WRIST FRACTURE SURGERY Left     Current Medications: Current Meds  Medication Sig  . acetaminophen (TYLENOL) 325 MG tablet Take 650 mg by mouth every 6 (six) hours as needed for moderate  pain or headache.   . albuterol (PROVENTIL HFA;VENTOLIN HFA) 108 (90 Base) MCG/ACT inhaler Inhale 2 puffs into the lungs every 6 (six) hours as needed for wheezing or shortness of breath.   Marland Kitchen amitriptyline (ELAVIL) 10 MG tablet Take 10-20 mg by mouth at bedtime.   . budesonide-formoterol (SYMBICORT) 160-4.5 MCG/ACT inhaler Inhale 2 puffs into the lungs 2 (two) times daily.   . calcium carbonate (TUMS - DOSED IN MG ELEMENTAL CALCIUM) 500 MG chewable tablet Chew 2 tablets by mouth daily as needed for indigestion or heartburn.   . cholecalciferol (VITAMIN D3) 25 MCG (1000 UT) tablet Take 2,000 Units by mouth daily.  . cyanocobalamin 1000  MCG tablet Take 1,000 mcg by mouth every evening.  . Ferrous Gluconate-C-Folic Acid (IRON-C PO) Take by mouth daily.  . fluticasone (FLONASE) 50 MCG/ACT nasal spray Place 2 sprays into both nostrils every evening.   . gabapentin (NEURONTIN) 300 MG capsule Take 300 mg by mouth at bedtime.  . hydrochlorothiazide (MICROZIDE) 12.5 MG capsule Take 12.5 mg by mouth every morning.  . Nintedanib (OFEV) 150 MG CAPS Take 150 mg by mouth daily.  . nitroGLYCERIN (NITROSTAT) 0.4 MG SL tablet Place 0.4 mg under the tongue every 5 (five) minutes as needed for chest pain.   . OXYGEN Inhale into the lungs at bedtime. 2 liters at bedtime  . rOPINIRole (REQUIP) 1 MG tablet Take 1 mg by mouth.  . rosuvastatin (CRESTOR) 20 MG tablet Take 20 mg by mouth at bedtime.   . sertraline (ZOLOFT) 100 MG tablet Take 200 mg by mouth at bedtime.   . [DISCONTINUED] alendronate (FOSAMAX) 70 MG tablet Take 70 mg by mouth once a week.   . [DISCONTINUED] amLODipine (NORVASC) 5 MG tablet Take 5 mg by mouth daily.  . [DISCONTINUED] azelastine (ASTELIN) 0.1 % nasal spray Place 2 sprays into both nostrils 2 (two) times daily.  . [DISCONTINUED] calcium carbonate (OS-CAL) 1250 (500 Ca) MG chewable tablet Chew 1 tablet by mouth daily.  . [DISCONTINUED] cefdinir (OMNICEF) 300 MG capsule Take 300 mg by mouth every 12 (twelve) hours.  . [DISCONTINUED] dicyclomine (BENTYL) 10 MG capsule Take 10 mg by mouth daily.  . [DISCONTINUED] doxycycline (VIBRA-TABS) 100 MG tablet Take 100 mg by mouth 2 (two) times daily.  . [DISCONTINUED] ipratropium-albuterol (DUONEB) 0.5-2.5 (3) MG/3ML SOLN Take 3 mLs by nebulization every 6 (six) hours as needed.  . [DISCONTINUED] levofloxacin (LEVAQUIN) 750 MG tablet Take 750 mg by mouth.  . [DISCONTINUED] metoprolol tartrate (LOPRESSOR) 25 MG tablet Take 25 mg by mouth 2 (two) times daily.  . [DISCONTINUED] Nintedanib (OFEV) 150 MG CAPS Take 150 mg by mouth 2 (two) times daily.  . [DISCONTINUED] omeprazole  (PRILOSEC) 20 MG capsule Take 20 mg by mouth daily.     Allergies:   Paxil [paroxetine hcl] and Tamiflu [oseltamivir phosphate]   Social History   Socioeconomic History  . Marital status: Widowed    Spouse name: Not on file  . Number of children: 1  . Years of education: Not on file  . Highest education level: Not on file  Occupational History  . Occupation: Retired Therapist, sports   Tobacco Use  . Smoking status: Former Smoker    Packs/day: 2.00    Years: 20.00    Pack years: 40.00    Types: Cigarettes    Quit date: 08/18/1983    Years since quitting: 37.0  . Smokeless tobacco: Never Used  Vaping Use  . Vaping Use: Never used  Substance and Sexual  Activity  . Alcohol use: Not Currently    Alcohol/week: 0.0 standard drinks  . Drug use: No  . Sexual activity: Not on file  Other Topics Concern  . Not on file  Social History Narrative  . Not on file   Social Determinants of Health   Financial Resource Strain: Not on file  Food Insecurity: Not on file  Transportation Needs: Not on file  Physical Activity: Not on file  Stress: Not on file  Social Connections: Not on file     Family History: The patient's family history includes Alzheimer's disease in her mother; Asthma in her father; Cervical cancer in her paternal grandmother; Colon polyps in her paternal aunt; Diabetes type II in her father and sister; Heart disease in her maternal uncle; Hypertension in her brother and mother; Leukemia in her father; Obesity in her sister; Sleep apnea in her sister; Stroke in her maternal aunt and sister. There is no history of Colon cancer, Esophageal cancer, or Gallbladder disease.  ROS:   Review of Systems  Constitution: Negative for decreased appetite, fever and weight gain.  HENT: Negative for congestion, ear discharge, hoarse voice and sore throat.   Eyes: Negative for discharge, redness, vision loss in right eye and visual halos.  Cardiovascular: Negative for chest pain, dyspnea on  exertion, leg swelling, orthopnea and palpitations.  Respiratory: Negative for cough, hemoptysis, shortness of breath and snoring.   Endocrine: Negative for heat intolerance and polyphagia.  Hematologic/Lymphatic: Negative for bleeding problem. Does not bruise/bleed easily.  Skin: Negative for flushing, nail changes, rash and suspicious lesions.  Musculoskeletal: Negative for arthritis, joint pain, muscle cramps, myalgias, neck pain and stiffness.  Gastrointestinal: Negative for abdominal pain, bowel incontinence, diarrhea and excessive appetite.  Genitourinary: Negative for decreased libido, genital sores and incomplete emptying.  Neurological: Negative for brief paralysis, focal weakness, headaches and loss of balance.  Psychiatric/Behavioral: Negative for altered mental status, depression and suicidal ideas.  Allergic/Immunologic: Negative for HIV exposure and persistent infections.    EKGs/Labs/Other Studies Reviewed:    The following studies were reviewed today:   EKG:  The ekg ordered today demonstrates sinus bradycardia, heart rate 51 beats minute  Echocardiogram done December 2021 shows hyperdynamic EF greater than 70%.  Grade 1 diastolic dysfunction.  Right ventricle normal in size and function.  Left atrium normal in size and volume.  Right atrium normal in size and function.  Trace mitral vegetation.  Trace tricuspid regurgitation.  Referentially systolic pressure 47 mmHg.  Aortic root, aortic arch and ascending aorta are all appears to be normal size.  Recent Labs: No results found for requested labs within last 8760 hours.  Recent Lipid Panel No results found for: CHOL, TRIG, HDL, CHOLHDL, VLDL, LDLCALC, LDLDIRECT  Physical Exam:    VS:  BP 110/62 (BP Location: Right Arm)   Pulse (!) 51   Ht 5\' 2"  (1.575 m)   Wt 154 lb 3.2 oz (69.9 kg)   SpO2 98%   BMI 28.20 kg/m     Wt Readings from Last 3 Encounters:  08/21/20 154 lb 3.2 oz (69.9 kg)  02/21/19 147 lb 14.9 oz  (67.1 kg)  01/19/19 152 lb 8 oz (69.2 kg)     GEN: Well nourished, well developed in no acute distress HEENT: Normal NECK: No JVD; No carotid bruits LYMPHATICS: No lymphadenopathy CARDIAC: S1S2 noted,RRR, no murmurs, rubs, gallops RESPIRATORY:  Clear to auscultation without rales, wheezing or rhonchi  ABDOMEN: Soft, non-tender, non-distended, +bowel sounds, no guarding. EXTREMITIES: No  edema, No cyanosis, no clubbing MUSCULOSKELETAL:  No deformity  SKIN: Warm and dry NEUROLOGIC:  Alert and oriented x 3, non-focal PSYCHIATRIC:  Normal affect, good insight  ASSESSMENT:    1. Sinus bradycardia   2. Precordial pain   3. Obstructive sleep apnea   4. History of junctional rhythm   5. Dizziness    PLAN:     Heart rate is in the 50s today in the office she is on metoprolol tartrate 25 mg twice daily.  Although her son tells me she only takes this once a day.  What I like to do is taper off of her beta-blocker of asked him to cut back on the metoprolol to 12.5 for the next 2 days and stop the medication.  He expresses understanding.  She will follow up in 2 weeks if she is still bradycardic then I like to place a monitor and patient understand as I do suspect chronotropic incompetence/sick sinus syndrome may be playing a role here.  In regards to her chest pain for now we will plan to nuclear stress test to understand  Educated patient and her son at this time no questions.   The patient is in agreement with the above plan. The patient left the office in stable condition.  The patient will follow up in 2 weeks or sooner if needed peer   Medication Adjustments/Labs and Tests Ordered: Current medicines are reviewed at length with the patient today.  Concerns regarding medicines are outlined above.  Orders Placed This Encounter  Procedures  . MYOCARDIAL PERFUSION IMAGING  . EKG 12-Lead   No orders of the defined types were placed in this encounter.   Patient Instructions   Medication Instructions:  Your physician has recommended you make the following change in your medication:  1) STOP taking Lopressor  *If you need a refill on your cardiac medications before your next appointment, please call your pharmacy*   Testing/Procedures: Your physician has requested that you have a lexiscan myoview. For further information please visit [email or web address]. Please follow instruction sheet, as given.  Follow-Up: At [facility], you and your health needs are our priority.  As part of our continuing mission to provide you with exceptional heart care, we have created designated Provider Care Teams.  These Care Teams include your primary Cardiologist (physician) and Advanced Practice Providers (APPs -  Physician Assistants and Nurse Practitioners) who all work together to provide you with the care you need, when you need it.  Your next appointment:   2 week(s)  The format for your next appointment:   In Person  Provider:   Berniece Salines, DO   Other Instructions:  The test will take approximately 3 to 4 hours to complete; you may bring reading material.  If someone comes with you to your appointment, they will need to remain in the main lobby due to limited space in the testing area. **If you are pregnant or breastfeeding, please notify the nuclear lab prior to your appointment**  How to prepare for your Myocardial Perfusion Test: . Do not eat or drink 3 hours prior to your test, except you may have water. . Do not consume products containing caffeine (regular or decaffeinated) 12 hours prior to your test. (ex: coffee, chocolate, sodas, tea). . Do bring a list of your current medications with you.  If not listed below, you may take your medications as normal. . Do wear comfortable clothes (no dresses or overalls) and walking shoes, tennis shoes preferred (No  heels or open toe shoes are allowed). . Do NOT wear cologne, perfume, aftershave, or lotions (deodorant is  allowed). . If these instructions are not followed, your test will have to be rescheduled.  Please report to [address] for your test.  If you have questions or concerns about your appointment, you can call the [facility] at [phone].  If you cannot keep your appointment, please provide 24 hours notification to the Nuclear Lab, to avoid a possible $50 charge to your account.    Adopting a Healthy Lifestyle.  Know what a healthy weight is for you (roughly BMI <25) and aim to maintain this   Aim for 7+ servings of fruits and vegetables daily   65-80+ fluid ounces of water or unsweet tea for healthy kidneys   Limit to max 1 drink of alcohol per day; avoid smoking/tobacco   Limit animal fats in diet for cholesterol and heart health - choose grass fed whenever available   Avoid highly processed foods, and foods high in saturated/trans fats   Aim for low stress - take time to unwind and care for your mental health   Aim for 150 min of moderate intensity exercise weekly for heart health, and weights twice weekly for bone health   Aim for 7-9 hours of sleep daily   When it comes to diets, agreement about the perfect plan isnt easy to find, even among the experts. Experts at the [employer] developed an idea known as the Healthy Eating Plate. Just imagine a plate divided into logical, healthy portions.   The emphasis is on diet quality:   Load up on vegetables and fruits - one-half of your plate: Aim for color and variety, and remember that potatoes dont count.   Go for whole grains - one-quarter of your plate: Whole wheat, barley, wheat berries, quinoa, oats, brown rice, and foods made with them. If you want pasta, go with whole wheat pasta.   Protein power - one-quarter of your plate: Fish, chicken, beans, and nuts are all healthy, versatile protein sources. Limit red meat.   The diet, however, does go beyond the  plate, offering a few other suggestions.   Use healthy plant oils, such as olive, canola, soy, corn, sunflower and peanut. Check the labels, and avoid partially hydrogenated oil, which have unhealthy trans fats.   If youre thirsty, drink water. Coffee and tea are good in moderation, but skip sugary drinks and limit milk and dairy products to one or two daily servings.   The type of carbohydrate in the diet is more important than the amount. Some sources of carbohydrates, such as vegetables, fruits, whole grains, and beans-are healthier than others.   Finally, stay active  Signed, Berniece Salines, DO  08/21/2020 1:10 PM    [facility]

## 2020-08-21 NOTE — Patient Instructions (Addendum)
Medication Instructions:  Your physician has recommended you make the following change in your medication:  1) STOP taking Lopressor  *If you need a refill on your cardiac medications before your next appointment, please call your pharmacy*   Testing/Procedures: Your physician has requested that you have a lexiscan myoview. For further information please visit [email or web address]. Please follow instruction sheet, as given.  Follow-Up: At [facility], you and your health needs are our priority.  As part of our continuing mission to provide you with exceptional heart care, we have created designated Provider Care Teams.  These Care Teams include your primary Cardiologist (physician) and Advanced Practice Providers (APPs -  Physician Assistants and Nurse Practitioners) who all work together to provide you with the care you need, when you need it.  Your next appointment:   2 week(s)  The format for your next appointment:   In Person  Provider:   Berniece Salines, DO   Other Instructions:  The test will take approximately 3 to 4 hours to complete; you may bring reading material.  If someone comes with you to your appointment, they will need to remain in the main lobby due to limited space in the testing area. **If you are pregnant or breastfeeding, please notify the nuclear lab prior to your appointment**  How to prepare for your Myocardial Perfusion Test: . Do not eat or drink 3 hours prior to your test, except you may have water. . Do not consume products containing caffeine (regular or decaffeinated) 12 hours prior to your test. (ex: coffee, chocolate, sodas, tea). . Do bring a list of your current medications with you.  If not listed below, you may take your medications as normal. . Do wear comfortable clothes (no dresses or overalls) and walking shoes, tennis shoes preferred (No heels or open toe shoes are allowed). . Do NOT wear cologne, perfume, aftershave, or lotions (deodorant is  allowed). . If these instructions are not followed, your test will have to be rescheduled.  Please report to [address] for your test.  If you have questions or concerns about your appointment, you can call the [facility] at [phone].  If you cannot keep your appointment, please provide 24 hours notification to the Nuclear Lab, to avoid a possible $50 charge to your account.

## 2020-08-29 ENCOUNTER — Telehealth: Payer: Self-pay

## 2020-08-29 ENCOUNTER — Other Ambulatory Visit: Payer: Self-pay

## 2020-08-29 ENCOUNTER — Ambulatory Visit (INDEPENDENT_AMBULATORY_CARE_PROVIDER_SITE_OTHER): Payer: Medicare Other

## 2020-08-29 DIAGNOSIS — R072 Precordial pain: Secondary | ICD-10-CM

## 2020-08-29 LAB — MYOCARDIAL PERFUSION IMAGING
LV dias vol: 74 mL (ref 46–106)
LV sys vol: 33 mL
Peak HR: 75 {beats}/min
Rest HR: 66 {beats}/min
SDS: 6
SRS: 10
SSS: 16
TID: 0.93

## 2020-08-29 MED ORDER — REGADENOSON 0.4 MG/5ML IV SOLN
0.4000 mg | Freq: Once | INTRAVENOUS | Status: AC
  Administered 2020-08-29: 0.4 mg via INTRAVENOUS

## 2020-08-29 MED ORDER — TECHNETIUM TC 99M TETROFOSMIN IV KIT
10.6000 | PACK | Freq: Once | INTRAVENOUS | Status: AC | PRN
  Administered 2020-08-29: 10.6 via INTRAVENOUS

## 2020-08-29 MED ORDER — TECHNETIUM TC 99M TETROFOSMIN IV KIT
32.8000 | PACK | Freq: Once | INTRAVENOUS | Status: AC | PRN
  Administered 2020-08-29: 32.8 via INTRAVENOUS

## 2020-08-29 NOTE — Telephone Encounter (Signed)
Patient notified of results.

## 2020-08-29 NOTE — Telephone Encounter (Signed)
-----   Message from Berniece Salines, DO sent at 08/29/2020  1:07 PM EST ----- Stress test normal

## 2020-09-10 ENCOUNTER — Ambulatory Visit: Payer: Medicare Other | Admitting: Cardiology

## 2020-09-13 ENCOUNTER — Encounter: Payer: Self-pay | Admitting: Cardiology

## 2020-09-13 ENCOUNTER — Ambulatory Visit (INDEPENDENT_AMBULATORY_CARE_PROVIDER_SITE_OTHER): Payer: Medicare Other | Admitting: Cardiology

## 2020-09-13 ENCOUNTER — Other Ambulatory Visit: Payer: Self-pay

## 2020-09-13 ENCOUNTER — Ambulatory Visit (INDEPENDENT_AMBULATORY_CARE_PROVIDER_SITE_OTHER): Payer: Medicare Other

## 2020-09-13 VITALS — BP 112/62 | HR 80 | Ht 62.0 in | Wt 146.4 lb

## 2020-09-13 DIAGNOSIS — E782 Mixed hyperlipidemia: Secondary | ICD-10-CM

## 2020-09-13 DIAGNOSIS — E559 Vitamin D deficiency, unspecified: Secondary | ICD-10-CM

## 2020-09-13 DIAGNOSIS — I10 Essential (primary) hypertension: Secondary | ICD-10-CM | POA: Diagnosis not present

## 2020-09-13 DIAGNOSIS — R001 Bradycardia, unspecified: Secondary | ICD-10-CM

## 2020-09-13 DIAGNOSIS — R5383 Other fatigue: Secondary | ICD-10-CM

## 2020-09-13 DIAGNOSIS — D509 Iron deficiency anemia, unspecified: Secondary | ICD-10-CM

## 2020-09-13 DIAGNOSIS — R072 Precordial pain: Secondary | ICD-10-CM

## 2020-09-13 DIAGNOSIS — R0602 Shortness of breath: Secondary | ICD-10-CM | POA: Diagnosis not present

## 2020-09-13 DIAGNOSIS — E538 Deficiency of other specified B group vitamins: Secondary | ICD-10-CM | POA: Diagnosis not present

## 2020-09-13 NOTE — Progress Notes (Signed)
Cardiology Office Note:    Date:  09/13/2020   ID:  Sherry Wang, DOB [birth year], MRN [id number]  PCP:  Greig Right, MD  Cardiologist:  Berniece Salines, DO  Electrophysiologist:  None   Referring MD: Greig Right, MD    History of Present Illness:    Sherry Wang is a 80 y.o. female with a hx of sinus bradycardia, accelerated junctional, postinflammatory pulmonary fibrosis, primary biliary cirrhosis is here today for follow-up visit.  I saw the patient on August 22, 2019 at that time we taper off her beta-blocker.  She is here today for follow-up visit.  The last visit I stopped her beta-blocker given the fact that she was significantly bradycardic.  We also plan for a nuclear stress test which the patient did get and this test was normal. She came today for follow-up visit with her son.   Past Medical History:  Diagnosis Date  . Anemia   . Anemia, iron deficiency 11/28/2014  . Anxiety   . Asthma   . AVM (arteriovenous malformation) of small bowel, acquired   . Biliary cirrhosis ([place])   . Cirrhosis ([place])   . Depression   . Dyspnea 02/14/2019  . Family history of adverse reaction to anesthesia    sister had hysteria after anesthesia  . Fast heart beat    occ  . Fibromyalgia   . Gallstones   . GERD (gastroesophageal reflux disease)   . GI bleeding   . Headache   . History of cataract   . History of home oxygen therapy    2 liters at hs only  . Hyperlipemia   . Junctional bradycardia   . Low blood pressure   . Non-Q wave non-ST elevation myocardial infarction (NSTEMI) ([place]) 2004  . Osteoporosis   . Pneumonia    last 2016  . Primary biliary cirrhosis ([place]) 11/28/2014  . Pulmonary fibrosis ([place])   . Sleep apnea    has had sleep study  . Tingling    left foot sing back surgery   . Wears glasses    Reading  . Wears partial dentures    lower    Past Surgical History:  Procedure Laterality Date  . CARDIAC CATHETERIZATION  2004  . CATARACT EXTRACTION, BILATERAL     . CHOLECYSTECTOMY    . COLONOSCOPY  11/08/2008  . COLONOSCOPY WITH PROPOFOL N/A 02/21/2019   Procedure: COLONOSCOPY WITH PROPOFOL;  Surgeon: Mauri Pole, MD;  Location: [facility];  Service: Endoscopy;  Laterality: N/A;  . ESOPHAGOGASTRODUODENOSCOPY (EGD) WITH PROPOFOL N/A 02/02/2017   Procedure: ESOPHAGOGASTRODUODENOSCOPY (EGD) WITH PROPOFOL;  Surgeon: Mauri Pole, MD;  Location: [facility];  Service: Endoscopy;  Laterality: N/A;  . ESOPHAGOGASTRODUODENOSCOPY (EGD) WITH PROPOFOL N/A 02/21/2019   Procedure: ESOPHAGOGASTRODUODENOSCOPY (EGD) WITH PROPOFOL;  Surgeon: Mauri Pole, MD;  Location: [facility];  Service: Endoscopy;  Laterality: N/A;  . LAMINECTOMY  age 6  . SHOULDER SURGERY Left   . TOE SURGERY Right   . TONSILLECTOMY     as a child  . WRIST FRACTURE SURGERY Left     Current Medications: Current Meds  Medication Sig  . acetaminophen (TYLENOL) 325 MG tablet Take 650 mg by mouth every 6 (six) hours as needed for moderate pain or headache.   . albuterol (PROVENTIL HFA;VENTOLIN HFA) 108 (90 Base) MCG/ACT inhaler Inhale 2 puffs into the lungs every 6 (six) hours as needed for wheezing or shortness of breath.   Marland Kitchen amitriptyline (ELAVIL) 10 MG tablet Take 10-20 mg by  mouth at bedtime.   . budesonide-formoterol (SYMBICORT) 160-4.5 MCG/ACT inhaler Inhale 2 puffs into the lungs 2 (two) times daily.   . calcium carbonate (TUMS - DOSED IN MG ELEMENTAL CALCIUM) 500 MG chewable tablet Chew 2 tablets by mouth daily as needed for indigestion or heartburn.   . cholecalciferol (VITAMIN D3) 25 MCG (1000 UT) tablet Take 2,000 Units by mouth daily.  . cyanocobalamin 1000 MCG tablet Take 1,000 mcg by mouth every evening.  . Ferrous Gluconate-C-Folic Acid (IRON-C PO) Take by mouth daily.  . fluticasone (FLONASE) 50 MCG/ACT nasal spray Place 2 sprays into both nostrils every evening.   . gabapentin (NEURONTIN) 300 MG capsule Take 300 mg by mouth at bedtime.  .  hydrochlorothiazide (MICROZIDE) 12.5 MG capsule Take 12.5 mg by mouth every morning.  Marland Kitchen LORazepam (ATIVAN) 1 MG tablet Take 1 mg by mouth.  . Nintedanib (OFEV) 150 MG CAPS Take 150 mg by mouth daily.  . nitroGLYCERIN (NITROSTAT) 0.4 MG SL tablet Place 0.4 mg under the tongue every 5 (five) minutes as needed for chest pain.   . OXYGEN Inhale into the lungs at bedtime. 2 liters at bedtime  . rOPINIRole (REQUIP) 1 MG tablet Take 1 mg by mouth.  . rosuvastatin (CRESTOR) 20 MG tablet Take 20 mg by mouth at bedtime.   . sertraline (ZOLOFT) 100 MG tablet Take 200 mg by mouth at bedtime.      Allergies:   Paxil [paroxetine hcl] and Tamiflu [oseltamivir phosphate]   Social History   Socioeconomic History  . Marital status: Widowed    Spouse name: Not on file  . Number of children: 1  . Years of education: Not on file  . Highest education level: Not on file  Occupational History  . Occupation: Retired Therapist, sports   Tobacco Use  . Smoking status: Former Smoker    Packs/day: 2.00    Years: 20.00    Pack years: 40.00    Types: Cigarettes    Quit date: 08/18/1983    Years since quitting: 37.0  . Smokeless tobacco: Never Used  Vaping Use  . Vaping Use: Never used  Substance and Sexual Activity  . Alcohol use: Not Currently    Alcohol/week: 0.0 standard drinks  . Drug use: No  . Sexual activity: Not on file  Other Topics Concern  . Not on file  Social History Narrative  . Not on file   Social Determinants of Health   Financial Resource Strain: Not on file  Food Insecurity: Not on file  Transportation Needs: Not on file  Physical Activity: Not on file  Stress: Not on file  Social Connections: Not on file     Family History: The patient's family history includes Alzheimer's disease in her mother; Asthma in her father; Cervical cancer in her paternal grandmother; Colon polyps in her paternal aunt; Diabetes type II in her father and sister; Heart disease in her maternal uncle; Hypertension in  her brother and mother; Leukemia in her father; Obesity in her sister; Sleep apnea in her sister; Stroke in her maternal aunt and sister. There is no history of Colon cancer, Esophageal cancer, or Gallbladder disease.  ROS:   Review of Systems  Constitution: Negative for decreased appetite, fever and weight gain.  HENT: Negative for congestion, ear discharge, hoarse voice and sore throat.   Eyes: Negative for discharge, redness, vision loss in right eye and visual halos.  Cardiovascular: Negative for chest pain, dyspnea on exertion, leg swelling, orthopnea and palpitations.  Respiratory:  Negative for cough, hemoptysis, shortness of breath and snoring.   Endocrine: Negative for heat intolerance and polyphagia.  Hematologic/Lymphatic: Negative for bleeding problem. Does not bruise/bleed easily.  Skin: Negative for flushing, nail changes, rash and suspicious lesions.  Musculoskeletal: Negative for arthritis, joint pain, muscle cramps, myalgias, neck pain and stiffness.  Gastrointestinal: Negative for abdominal pain, bowel incontinence, diarrhea and excessive appetite.  Genitourinary: Negative for decreased libido, genital sores and incomplete emptying.  Neurological: Negative for brief paralysis, focal weakness, headaches and loss of balance.  Psychiatric/Behavioral: Negative for altered mental status, depression and suicidal ideas.  Allergic/Immunologic: Negative for HIV exposure and persistent infections.    EKGs/Labs/Other Studies Reviewed:    The following studies were reviewed today:   EKG: None today  Echocardiogram done December 2021 shows hyperdynamic EF greater than 70%.  Grade 1 diastolic dysfunction.  Right ventricle normal in size and function.  Left atrium normal in size and volume.  Right atrium normal in size and function.  Trace mitral vegetation.  Trace tricuspid regurgitation.  Referentially systolic pressure 47 mmHg.  Aortic root, aortic arch and ascending aorta are all  appears to be normal size.  Recent Labs: No results found for requested labs within last 8760 hours.  Recent Lipid Panel No results found for: CHOL, TRIG, HDL, CHOLHDL, VLDL, LDLCALC, LDLDIRECT  Physical Exam:    VS:  BP 112/62   Pulse 80   Ht 5\' 2"  (1.575 m)   Wt 146 lb 6.4 oz (66.4 kg)   SpO2 92%   BMI 26.78 kg/m     Wt Readings from Last 3 Encounters:  09/13/20 146 lb 6.4 oz (66.4 kg)  08/29/20 154 lb (69.9 kg)  08/21/20 154 lb 3.2 oz (69.9 kg)     GEN: Well nourished, well developed in no acute distress HEENT: Normal NECK: No JVD; No carotid bruits LYMPHATICS: No lymphadenopathy CARDIAC: S1S2 noted,RRR, no murmurs, rubs, gallops RESPIRATORY:  Clear to auscultation without rales, wheezing or rhonchi  ABDOMEN: Soft, non-tender, non-distended, +bowel sounds, no guarding. EXTREMITIES: No edema, No cyanosis, no clubbing MUSCULOSKELETAL:  No deformity  SKIN: Warm and dry NEUROLOGIC:  Alert and oriented x 3, non-focal PSYCHIATRIC:  Normal affect, good insight  ASSESSMENT:    1. Sinus bradycardia   2. Fatigue, unspecified type   3. Vitamin D deficiency, unspecified    4. Deficiency of other specified B group vitamins    5. Shortness of breath   6. Primary hypertension   7. Mixed hyperlipidemia    PLAN:     Thankfully after stopping her beta-blocker her heart rate looks better in the office today  but the patient tells me that she has significant fatigue at home.  He does not have any interest in doing anything.  We will like to do is place a monitor the patient and make sure chronotropic incompetence is not playing a role here.  In addition we will get vitamin D level and TSH as well as CBC.  Blood pressure is acceptable.  Hyperlipidemia - continue with current statin medication.  The patient is in agreement with the above plan. The patient left the office in stable condition.  The patient will follow up in 3 month.    Medication Adjustments/Labs and Tests  Ordered: Current medicines are reviewed at length with the patient today.  Concerns regarding medicines are outlined above.  Orders Placed This Encounter  Procedures  . VITAMIN D 25 Hydroxy (Vit-D Deficiency, Fractures)  . CBC  . TSH  .  B12  . LONG TERM MONITOR (3-14 DAYS)   No orders of the defined types were placed in this encounter.   Patient Instructions  Medication Instructions:  Your physician recommends that you continue on your current medications as directed. Please refer to the Current Medication list given to you today.  *If you need a refill on your cardiac medications before your next appointment, please call your pharmacy*   Lab Work: Your physician recommends that you return for lab work in: TODAY CBC, TSH, Vitamin D If you have labs (blood work) drawn today and your tests are completely normal, you will receive your results only by: Marland Kitchen MyChart Message (if you have MyChart) OR . A paper copy in the mail If you have any lab test that is abnormal or we need to change your treatment, we will call you to review the results.   Testing/Procedures: A zio monitor was ordered today. It will remain on for 7 days. You will then return monitor and event diary in provided box. It takes 1-2 weeks for report to be downloaded and returned to [place]. We will call you with the results. If monitor falls off or has orange flashing light, please call Zio for further instructions.      Follow-Up: At [facility], you and your health needs are our priority.  As part of our continuing mission to provide you with exceptional heart care, we have created designated Provider Care Teams.  These Care Teams include your primary Cardiologist (physician) and Advanced Practice Providers (APPs -  Physician Assistants and Nurse Practitioners) who all work together to provide you with the care you need, when you need it.  We recommend signing up for the patient portal called "MyChart".  Sign up  information is provided on this After Visit Summary.  MyChart is used to connect with patients for Virtual Visits (Telemedicine).  Patients are able to view lab/test results, encounter notes, upcoming appointments, etc.  Non-urgent messages can be sent to your provider as well.   To learn more about what you can do with MyChart, go to [email or web address].    Your next appointment:   3 month(s)  The format for your next appointment:   In Person  Provider:   Berniece Salines, DO   Other Instructions      Adopting a Healthy Lifestyle.  Know what a healthy weight is for you (roughly BMI <25) and aim to maintain this   Aim for 7+ servings of fruits and vegetables daily   65-80+ fluid ounces of water or unsweet tea for healthy kidneys   Limit to max 1 drink of alcohol per day; avoid smoking/tobacco   Limit animal fats in diet for cholesterol and heart health - choose grass fed whenever available   Avoid highly processed foods, and foods high in saturated/trans fats   Aim for low stress - take time to unwind and care for your mental health   Aim for 150 min of moderate intensity exercise weekly for heart health, and weights twice weekly for bone health   Aim for 7-9 hours of sleep daily   When it comes to diets, agreement about the perfect plan isnt easy to find, even among the experts. Experts at the [employer] developed an idea known as the Healthy Eating Plate. Just imagine a plate divided into logical, healthy portions.   The emphasis is on diet quality:   Load up on vegetables and fruits - one-half of your plate: Aim  for color and variety, and remember that potatoes dont count.   Go for whole grains - one-quarter of your plate: Whole wheat, barley, wheat berries, quinoa, oats, brown rice, and foods made with them. If you want pasta, go with whole wheat pasta.   Protein power - one-quarter of your plate: Fish, chicken, beans, and nuts are all healthy,  versatile protein sources. Limit red meat.   The diet, however, does go beyond the plate, offering a few other suggestions.   Use healthy plant oils, such as olive, canola, soy, corn, sunflower and peanut. Check the labels, and avoid partially hydrogenated oil, which have unhealthy trans fats.   If youre thirsty, drink water. Coffee and tea are good in moderation, but skip sugary drinks and limit milk and dairy products to one or two daily servings.   The type of carbohydrate in the diet is more important than the amount. Some sources of carbohydrates, such as vegetables, fruits, whole grains, and beans-are healthier than others.   Finally, stay active  Signed, Berniece Salines, DO  09/13/2020 2:51 PM    [place]

## 2020-09-13 NOTE — Patient Instructions (Signed)
Medication Instructions:  Your physician recommends that you continue on your current medications as directed. Please refer to the Current Medication list given to you today.  *If you need a refill on your cardiac medications before your next appointment, please call your pharmacy*   Lab Work: Your physician recommends that you return for lab work in: TODAY CBC, TSH, Vitamin D If you have labs (blood work) drawn today and your tests are completely normal, you will receive your results only by: Marland Kitchen MyChart Message (if you have MyChart) OR . A paper copy in the mail If you have any lab test that is abnormal or we need to change your treatment, we will call you to review the results.   Testing/Procedures: A zio monitor was ordered today. It will remain on for 7 days. You will then return monitor and event diary in provided box. It takes 1-2 weeks for report to be downloaded and returned to [place]. We will call you with the results. If monitor falls off or has orange flashing light, please call Zio for further instructions.      Follow-Up: At [facility], you and your health needs are our priority.  As part of our continuing mission to provide you with exceptional heart care, we have created designated Provider Care Teams.  These Care Teams include your primary Cardiologist (physician) and Advanced Practice Providers (APPs -  Physician Assistants and Nurse Practitioners) who all work together to provide you with the care you need, when you need it.  We recommend signing up for the patient portal called "MyChart".  Sign up information is provided on this After Visit Summary.  MyChart is used to connect with patients for Virtual Visits (Telemedicine).  Patients are able to view lab/test results, encounter notes, upcoming appointments, etc.  Non-urgent messages can be sent to your provider as well.   To learn more about what you can do with MyChart, go to [email or web address].    Your next  appointment:   3 month(s)  The format for your next appointment:   In Person  Provider:   Berniece Salines, DO   Other Instructions

## 2020-09-14 LAB — CBC
Hematocrit: 38.9 % (ref 34.0–46.6)
Hemoglobin: 12.1 g/dL (ref 11.1–15.9)
MCH: 22.9 pg — ABNORMAL LOW (ref 26.6–33.0)
MCHC: 31.1 g/dL — ABNORMAL LOW (ref 31.5–35.7)
MCV: 74 fL — ABNORMAL LOW (ref 79–97)
Platelets: 240 10*3/uL (ref 150–450)
RBC: 5.29 x10E6/uL — ABNORMAL HIGH (ref 3.77–5.28)
RDW: 21.2 % — ABNORMAL HIGH (ref 11.7–15.4)
WBC: 6.3 10*3/uL (ref 3.4–10.8)

## 2020-09-14 LAB — VITAMIN D 25 HYDROXY (VIT D DEFICIENCY, FRACTURES): Vit D, 25-Hydroxy: 64.5 ng/mL (ref 30.0–100.0)

## 2020-09-14 LAB — TSH: TSH: 2.38 u[IU]/mL (ref 0.450–4.500)

## 2020-09-14 LAB — VITAMIN B12: Vitamin B-12: >2000 pg/mL — ABNORMAL HIGH (ref 232–1245)

## 2020-09-18 DIAGNOSIS — D509 Iron deficiency anemia, unspecified: Secondary | ICD-10-CM | POA: Diagnosis not present

## 2020-09-19 ENCOUNTER — Telehealth: Payer: Self-pay | Admitting: Cardiology

## 2020-09-19 DIAGNOSIS — J84112 Idiopathic pulmonary fibrosis: Secondary | ICD-10-CM | POA: Diagnosis not present

## 2020-09-19 DIAGNOSIS — I499 Cardiac arrhythmia, unspecified: Secondary | ICD-10-CM | POA: Diagnosis not present

## 2020-09-19 DIAGNOSIS — D649 Anemia, unspecified: Secondary | ICD-10-CM | POA: Diagnosis not present

## 2020-09-19 DIAGNOSIS — I4891 Unspecified atrial fibrillation: Secondary | ICD-10-CM | POA: Diagnosis not present

## 2020-09-19 DIAGNOSIS — I950 Idiopathic hypotension: Secondary | ICD-10-CM | POA: Diagnosis not present

## 2020-09-19 DIAGNOSIS — Z6826 Body mass index (BMI) 26.0-26.9, adult: Secondary | ICD-10-CM | POA: Diagnosis not present

## 2020-09-19 DIAGNOSIS — G2581 Restless legs syndrome: Secondary | ICD-10-CM | POA: Diagnosis not present

## 2020-09-19 DIAGNOSIS — J159 Unspecified bacterial pneumonia: Secondary | ICD-10-CM | POA: Diagnosis not present

## 2020-09-19 DIAGNOSIS — R769 Abnormal immunological finding in serum, unspecified: Secondary | ICD-10-CM | POA: Diagnosis not present

## 2020-09-19 LAB — IRON AND TIBC
Iron Saturation: 14 % — ABNORMAL LOW (ref 15–55)
Iron: 38 ug/dL (ref 27–139)
Total Iron Binding Capacity: 281 ug/dL (ref 250–450)
UIBC: 243 ug/dL (ref 118–369)

## 2020-09-19 LAB — FERRITIN: Ferritin: 41 ng/mL (ref 15–150)

## 2020-09-19 NOTE — Telephone Encounter (Signed)
Results faxed.

## 2020-09-19 NOTE — Telephone Encounter (Signed)
Gale from Pt PCP called in and stated that they are needing a copy of all of the pt's labs from 1/28 though yesterday.     [facility]  Fax number [phone]

## 2020-09-23 DIAGNOSIS — H43393 Other vitreous opacities, bilateral: Secondary | ICD-10-CM | POA: Diagnosis not present

## 2020-09-23 DIAGNOSIS — H40013 Open angle with borderline findings, low risk, bilateral: Secondary | ICD-10-CM | POA: Diagnosis not present

## 2020-09-26 DIAGNOSIS — R001 Bradycardia, unspecified: Secondary | ICD-10-CM | POA: Diagnosis not present

## 2020-09-26 DIAGNOSIS — R072 Precordial pain: Secondary | ICD-10-CM | POA: Diagnosis not present

## 2020-09-30 DIAGNOSIS — K743 Primary biliary cirrhosis: Secondary | ICD-10-CM | POA: Diagnosis not present

## 2020-09-30 DIAGNOSIS — M79671 Pain in right foot: Secondary | ICD-10-CM | POA: Diagnosis not present

## 2020-09-30 DIAGNOSIS — M549 Dorsalgia, unspecified: Secondary | ICD-10-CM | POA: Diagnosis not present

## 2020-09-30 DIAGNOSIS — M2550 Pain in unspecified joint: Secondary | ICD-10-CM | POA: Diagnosis not present

## 2020-09-30 DIAGNOSIS — R768 Other specified abnormal immunological findings in serum: Secondary | ICD-10-CM | POA: Diagnosis not present

## 2020-09-30 DIAGNOSIS — M533 Sacrococcygeal disorders, not elsewhere classified: Secondary | ICD-10-CM | POA: Diagnosis not present

## 2020-09-30 DIAGNOSIS — M25579 Pain in unspecified ankle and joints of unspecified foot: Secondary | ICD-10-CM | POA: Diagnosis not present

## 2020-09-30 DIAGNOSIS — J8410 Pulmonary fibrosis, unspecified: Secondary | ICD-10-CM | POA: Diagnosis not present

## 2020-09-30 DIAGNOSIS — M79641 Pain in right hand: Secondary | ICD-10-CM | POA: Diagnosis not present

## 2020-09-30 DIAGNOSIS — M79672 Pain in left foot: Secondary | ICD-10-CM | POA: Diagnosis not present

## 2020-09-30 DIAGNOSIS — R5383 Other fatigue: Secondary | ICD-10-CM | POA: Diagnosis not present

## 2020-09-30 DIAGNOSIS — R591 Generalized enlarged lymph nodes: Secondary | ICD-10-CM | POA: Diagnosis not present

## 2020-09-30 DIAGNOSIS — M545 Low back pain, unspecified: Secondary | ICD-10-CM | POA: Diagnosis not present

## 2020-09-30 DIAGNOSIS — R700 Elevated erythrocyte sedimentation rate: Secondary | ICD-10-CM | POA: Diagnosis not present

## 2020-09-30 DIAGNOSIS — M79642 Pain in left hand: Secondary | ICD-10-CM | POA: Diagnosis not present

## 2020-10-02 DIAGNOSIS — G2581 Restless legs syndrome: Secondary | ICD-10-CM | POA: Diagnosis not present

## 2020-10-02 DIAGNOSIS — J4520 Mild intermittent asthma, uncomplicated: Secondary | ICD-10-CM | POA: Diagnosis not present

## 2020-10-02 DIAGNOSIS — J310 Chronic rhinitis: Secondary | ICD-10-CM | POA: Diagnosis not present

## 2020-10-02 DIAGNOSIS — J479 Bronchiectasis, uncomplicated: Secondary | ICD-10-CM | POA: Diagnosis not present

## 2020-10-02 DIAGNOSIS — G4733 Obstructive sleep apnea (adult) (pediatric): Secondary | ICD-10-CM | POA: Diagnosis not present

## 2020-10-02 DIAGNOSIS — J84112 Idiopathic pulmonary fibrosis: Secondary | ICD-10-CM | POA: Diagnosis not present

## 2020-10-02 DIAGNOSIS — R945 Abnormal results of liver function studies: Secondary | ICD-10-CM | POA: Diagnosis not present

## 2020-10-04 DIAGNOSIS — R768 Other specified abnormal immunological findings in serum: Secondary | ICD-10-CM | POA: Diagnosis not present

## 2020-10-07 DIAGNOSIS — R768 Other specified abnormal immunological findings in serum: Secondary | ICD-10-CM | POA: Diagnosis not present

## 2020-10-09 DIAGNOSIS — J84112 Idiopathic pulmonary fibrosis: Secondary | ICD-10-CM | POA: Diagnosis not present

## 2020-10-17 DIAGNOSIS — D259 Leiomyoma of uterus, unspecified: Secondary | ICD-10-CM | POA: Diagnosis not present

## 2020-10-17 DIAGNOSIS — M5441 Lumbago with sciatica, right side: Secondary | ICD-10-CM | POA: Diagnosis not present

## 2020-10-17 DIAGNOSIS — M545 Low back pain, unspecified: Secondary | ICD-10-CM | POA: Diagnosis not present

## 2020-10-17 DIAGNOSIS — M79604 Pain in right leg: Secondary | ICD-10-CM | POA: Diagnosis not present

## 2020-10-17 DIAGNOSIS — M5431 Sciatica, right side: Secondary | ICD-10-CM | POA: Diagnosis not present

## 2020-10-17 DIAGNOSIS — M25551 Pain in right hip: Secondary | ICD-10-CM | POA: Diagnosis not present

## 2020-10-18 DIAGNOSIS — R700 Elevated erythrocyte sedimentation rate: Secondary | ICD-10-CM | POA: Diagnosis not present

## 2020-10-18 DIAGNOSIS — K743 Primary biliary cirrhosis: Secondary | ICD-10-CM | POA: Diagnosis not present

## 2020-10-18 DIAGNOSIS — R768 Other specified abnormal immunological findings in serum: Secondary | ICD-10-CM | POA: Diagnosis not present

## 2020-10-18 DIAGNOSIS — R591 Generalized enlarged lymph nodes: Secondary | ICD-10-CM | POA: Diagnosis not present

## 2020-10-18 DIAGNOSIS — M2550 Pain in unspecified joint: Secondary | ICD-10-CM | POA: Diagnosis not present

## 2020-10-18 DIAGNOSIS — M25579 Pain in unspecified ankle and joints of unspecified foot: Secondary | ICD-10-CM | POA: Diagnosis not present

## 2020-10-18 DIAGNOSIS — J849 Interstitial pulmonary disease, unspecified: Secondary | ICD-10-CM | POA: Diagnosis not present

## 2020-10-18 DIAGNOSIS — R5383 Other fatigue: Secondary | ICD-10-CM | POA: Diagnosis not present

## 2020-10-18 DIAGNOSIS — M549 Dorsalgia, unspecified: Secondary | ICD-10-CM | POA: Diagnosis not present

## 2020-10-18 DIAGNOSIS — M3500 Sicca syndrome, unspecified: Secondary | ICD-10-CM | POA: Diagnosis not present

## 2020-10-18 DIAGNOSIS — J8410 Pulmonary fibrosis, unspecified: Secondary | ICD-10-CM | POA: Diagnosis not present

## 2020-10-21 ENCOUNTER — Ambulatory Visit (INDEPENDENT_AMBULATORY_CARE_PROVIDER_SITE_OTHER): Payer: Medicare Other | Admitting: Cardiology

## 2020-10-21 ENCOUNTER — Other Ambulatory Visit: Payer: Self-pay

## 2020-10-21 ENCOUNTER — Encounter: Payer: Self-pay | Admitting: Cardiology

## 2020-10-21 VITALS — BP 116/68 | HR 84 | Ht 62.0 in | Wt 143.2 lb

## 2020-10-21 DIAGNOSIS — I10 Essential (primary) hypertension: Secondary | ICD-10-CM

## 2020-10-21 NOTE — Progress Notes (Signed)
Cardiology Office Note:    Date:  10/22/2020   ID:  Sherry Wang, DOB 1941/01/06, MRN [id number]  PCP:  Greig Right, MD  Cardiologist:  Berniece Salines, DO  Electrophysiologist:  None   Referring MD: Greig Right, MD   I am doing fine   History of Present Illness:    Sherry Wang is a 80 y.o. female with a hx of  sinus bradycardia, accelerated junctional, postinflammatory pulmonary fibrosis, primary biliary cirrhosis is here today for follow-up visit.   I saw the patient on August 22, 2019 at that time we taper off her beta-blocker.  She is here today for follow-up visit.  At her last visit her heart rate has improved but the patient reported she was experiencing significant fatigue we placed a monitor on the patient understand if chronotropic incompetence are playing a role.  She is here today for follow-up visit.  Past Medical History:  Diagnosis Date   Anemia    Anemia, iron deficiency 11/28/2014   Anxiety    Asthma    AVM (arteriovenous malformation) of small bowel, acquired    Biliary cirrhosis ([place])    Cirrhosis ([place])    Depression    Dyspnea 02/14/2019   Family history of adverse reaction to anesthesia    sister had hysteria after anesthesia   Fast heart beat    occ   Fibromyalgia    Gallstones    GERD (gastroesophageal reflux disease)    GI bleeding    Headache    History of cataract    History of home oxygen therapy    2 liters at hs only   Hyperlipemia    Junctional bradycardia    Low blood pressure    Non-Q wave non-ST elevation myocardial infarction (NSTEMI) ([place]) 2004   Osteoporosis    Pneumonia    last 2016   Primary biliary cirrhosis ([place]) 11/28/2014   Pulmonary fibrosis ([place])    Sleep apnea    has had sleep study   Tingling    left foot sing back surgery    Wears glasses    Reading   Wears partial dentures    lower    Past Surgical History:  Procedure Laterality Date   CARDIAC CATHETERIZATION  2004    CATARACT EXTRACTION, BILATERAL     CHOLECYSTECTOMY     COLONOSCOPY  11/08/2008   COLONOSCOPY WITH PROPOFOL N/A 02/21/2019   Procedure: COLONOSCOPY WITH PROPOFOL;  Surgeon: Mauri Pole, MD;  Location: [facility];  Service: Endoscopy;  Laterality: N/A;   ESOPHAGOGASTRODUODENOSCOPY (EGD) WITH PROPOFOL N/A 02/02/2017   Procedure: ESOPHAGOGASTRODUODENOSCOPY (EGD) WITH PROPOFOL;  Surgeon: Mauri Pole, MD;  Location: [facility];  Service: Endoscopy;  Laterality: N/A;   ESOPHAGOGASTRODUODENOSCOPY (EGD) WITH PROPOFOL N/A 02/21/2019   Procedure: ESOPHAGOGASTRODUODENOSCOPY (EGD) WITH PROPOFOL;  Surgeon: Mauri Pole, MD;  Location: [facility];  Service: Endoscopy;  Laterality: N/A;   LAMINECTOMY  age 29   SHOULDER SURGERY Left    TOE SURGERY Right    TONSILLECTOMY     as a child   WRIST FRACTURE SURGERY Left     Current Medications: Current Meds  Medication Sig   acetaminophen (TYLENOL) 325 MG tablet Take 650 mg by mouth every 6 (six) hours as needed for moderate pain or headache.    albuterol (PROVENTIL HFA;VENTOLIN HFA) 108 (90 Base) MCG/ACT inhaler Inhale 2 puffs into the lungs every 6 (six) hours as needed for wheezing or shortness of breath.    budesonide-formoterol (SYMBICORT) 160-4.5 MCG/ACT inhaler  Inhale 2 puffs into the lungs 2 (two) times daily.    calcium carbonate (TUMS - DOSED IN MG ELEMENTAL CALCIUM) 500 MG chewable tablet Chew 2 tablets by mouth daily as needed for indigestion or heartburn.    cholecalciferol (VITAMIN D3) 25 MCG (1000 UT) tablet Take 2,000 Units by mouth daily.   cyanocobalamin 1000 MCG tablet Take 1,000 mcg by mouth every evening.   diclofenac Sodium (VOLTAREN) 1 % GEL 4 (four) times daily as needed.   Ferrous Gluconate-C-Folic Acid (IRON-C PO) Take by mouth daily.   fluticasone (FLONASE) 50 MCG/ACT nasal spray Place 2 sprays into both nostrils every evening.    gabapentin (NEURONTIN) 300 MG capsule Take 300 mg by mouth  at bedtime.   Nintedanib (OFEV) 150 MG CAPS Take 150 mg by mouth daily.   nitroGLYCERIN (NITROSTAT) 0.4 MG SL tablet Place 0.4 mg under the tongue every 5 (five) minutes as needed for chest pain.    OXYGEN Inhale into the lungs at bedtime. 2 liters at bedtime   predniSONE (DELTASONE) 20 MG tablet Take 40 mg by mouth daily.   rosuvastatin (CRESTOR) 20 MG tablet Take 20 mg by mouth at bedtime.    sertraline (ZOLOFT) 100 MG tablet Take 200 mg by mouth at bedtime.      Allergies:   Ativan [lorazepam], Paxil [paroxetine hcl], and Tamiflu [oseltamivir phosphate]   Social History   Socioeconomic History   Marital status: Widowed    Spouse name: Not on file   Number of children: 1   Years of education: Not on file   Highest education level: Not on file  Occupational History   Occupation: Retired Therapist, sports   Tobacco Use   Smoking status: Former Smoker    Packs/day: 2.00    Years: 20.00    Pack years: 40.00    Types: Cigarettes    Quit date: 08/18/1983    Years since quitting: 37.2   Smokeless tobacco: Never Used  Vaping Use   Vaping Use: Never used  Substance and Sexual Activity   Alcohol use: Not Currently    Alcohol/week: 0.0 standard drinks   Drug use: No   Sexual activity: Not on file  Other Topics Concern   Not on file  Social History Narrative   Not on file   Social Determinants of Health   Financial Resource Strain: Not on file  Food Insecurity: Not on file  Transportation Needs: Not on file  Physical Activity: Not on file  Stress: Not on file  Social Connections: Not on file     Family History: The patient's family history includes Alzheimer's disease in her mother; Asthma in her father; Cervical cancer in her paternal grandmother; Colon polyps in her paternal aunt; Diabetes type II in her father and sister; Heart disease in her maternal uncle; Hypertension in her brother and mother; Leukemia in her father; Obesity in her sister; Sleep apnea in her  sister; Stroke in her maternal aunt and sister. There is no history of Colon cancer, Esophageal cancer, or Gallbladder disease.  ROS:   Review of Systems  Constitution: Negative for decreased appetite, fever and weight gain.  HENT: Negative for congestion, ear discharge, hoarse voice and sore throat.   Eyes: Negative for discharge, redness, vision loss in right eye and visual halos.  Cardiovascular: Negative for chest pain, dyspnea on exertion, leg swelling, orthopnea and palpitations.  Respiratory: Negative for cough, hemoptysis, shortness of breath and snoring.   Endocrine: Negative for heat intolerance and polyphagia.  Hematologic/Lymphatic:  Negative for bleeding problem. Does not bruise/bleed easily.  Skin: Negative for flushing, nail changes, rash and suspicious lesions.  Musculoskeletal: Negative for arthritis, joint pain, muscle cramps, myalgias, neck pain and stiffness.  Gastrointestinal: Negative for abdominal pain, bowel incontinence, diarrhea and excessive appetite.  Genitourinary: Negative for decreased libido, genital sores and incomplete emptying.  Neurological: Negative for brief paralysis, focal weakness, headaches and loss of balance.  Psychiatric/Behavioral: Negative for altered mental status, depression and suicidal ideas.  Allergic/Immunologic: Negative for HIV exposure and persistent infections.    EKGs/Labs/Other Studies Reviewed:    The following studies were reviewed today:   EKG:  None today  Echocardiogram done December 2021 shows hyperdynamic EF greater than 70%. Grade 1 diastolic dysfunction. Right ventricle normal in size and function. Left atrium normal in size and volume. Right atrium normal in size and function. Trace mitral regurgitation. Trace tricuspid regurgitation. Referentially systolic pressure 47 mmHg. Aortic root, aortic arch and ascending aorta are all appears to be normal size.   Recent Labs: 09/13/2020: Hemoglobin 12.1; Platelets 240;  TSH 2.380  Recent Lipid Panel No results found for: CHOL, TRIG, HDL, CHOLHDL, VLDL, LDLCALC, LDLDIRECT  Physical Exam:    VS:  BP 116/68    Pulse 84    Ht 5\' 2"  (1.575 m)    Wt 143 lb 3.2 oz (65 kg)    SpO2 97%    BMI 26.19 kg/m     Wt Readings from Last 3 Encounters:  10/21/20 143 lb 3.2 oz (65 kg)  09/13/20 146 lb 6.4 oz (66.4 kg)  08/29/20 154 lb (69.9 kg)     GEN: Well nourished, well developed in no acute distress HEENT: Normal NECK: No JVD; No carotid bruits LYMPHATICS: No lymphadenopathy CARDIAC: S1S2 noted,RRR, no murmurs, rubs, gallops RESPIRATORY:  Clear to auscultation without rales, wheezing or rhonchi  ABDOMEN: Soft, non-tender, non-distended, +bowel sounds, no guarding. EXTREMITIES: No edema, No cyanosis, no clubbing MUSCULOSKELETAL:  No deformity  SKIN: Warm and dry NEUROLOGIC:  Alert and oriented x 3, non-focal PSYCHIATRIC:  Normal affect, good insight  ASSESSMENT:    1. Primary hypertension    PLAN:     She is doing well from a cardiovascular standpoint.  No medication changes will be made today.  The patient is in agreement with the above plan. The patient left the office in stable condition.  The patient will follow up in   Medication Adjustments/Labs and Tests Ordered: Current medicines are reviewed at length with the patient today.  Concerns regarding medicines are outlined above.  No orders of the defined types were placed in this encounter.  No orders of the defined types were placed in this encounter.   Patient Instructions  Medication Instructions:  Your physician recommends that you continue on your current medications as directed. Please refer to the Current Medication list given to you today.  *If you need a refill on your cardiac medications before your next appointment, please call your pharmacy*   Lab Work: None If you have labs (blood work) drawn today and your tests are completely normal, you will receive your results only  by:  [place] (if you have MyChart) OR  A paper copy in the mail If you have any lab test that is abnormal or we need to change your treatment, we will call you to review the results.   Testing/Procedures: None   Follow-Up: At [facility], you and your health needs are our priority.  As part of our continuing mission to  provide you with exceptional heart care, we have created designated Provider Care Teams.  These Care Teams include your primary Cardiologist (physician) and Advanced Practice Providers (APPs -  Physician Assistants and Nurse Practitioners) who all work together to provide you with the care you need, when you need it.  We recommend signing up for the patient portal called "MyChart".  Sign up information is provided on this After Visit Summary.  MyChart is used to connect with patients for Virtual Visits (Telemedicine).  Patients are able to view lab/test results, encounter notes, upcoming appointments, etc.  Non-urgent messages can be sent to your provider as well.   To learn more about what you can do with MyChart, go to [email or web address].    Your next appointment:   1 year(s)  The format for your next appointment:   In Person  Provider:   Berniece Salines, DO   Other Instructions      Adopting a Healthy Lifestyle.  Know what a healthy weight is for you (roughly BMI <25) and aim to maintain this   Aim for 7+ servings of fruits and vegetables daily   65-80+ fluid ounces of water or unsweet tea for healthy kidneys   Limit to max 1 drink of alcohol per day; avoid smoking/tobacco   Limit animal fats in diet for cholesterol and heart health - choose grass fed whenever available   Avoid highly processed foods, and foods high in saturated/trans fats   Aim for low stress - take time to unwind and care for your mental health   Aim for 150 min of moderate intensity exercise weekly for heart health, and weights twice weekly for bone health   Aim for  7-9 hours of sleep daily   When it comes to diets, agreement about the perfect plan isnt easy to find, even among the experts. Experts at the [employer] developed an idea known as the Healthy Eating Plate. Just imagine a plate divided into logical, healthy portions.   The emphasis is on diet quality:   Load up on vegetables and fruits - one-half of your plate: Aim for color and variety, and remember that potatoes dont count.   Go for whole grains - one-quarter of your plate: Whole wheat, barley, wheat berries, quinoa, oats, brown rice, and foods made with them. If you want pasta, go with whole wheat pasta.   Protein power - one-quarter of your plate: Fish, chicken, beans, and nuts are all healthy, versatile protein sources. Limit red meat.   The diet, however, does go beyond the plate, offering a few other suggestions.   Use healthy plant oils, such as olive, canola, soy, corn, sunflower and peanut. Check the labels, and avoid partially hydrogenated oil, which have unhealthy trans fats.   If youre thirsty, drink water. Coffee and tea are good in moderation, but skip sugary drinks and limit milk and dairy products to one or two daily servings.   The type of carbohydrate in the diet is more important than the amount. Some sources of carbohydrates, such as vegetables, fruits, whole grains, and beans-are healthier than others.   Finally, stay active  Signed, Berniece Salines, DO  10/22/2020 11:35 AM    [place]

## 2020-10-21 NOTE — Patient Instructions (Signed)

## 2020-11-06 DIAGNOSIS — J4520 Mild intermittent asthma, uncomplicated: Secondary | ICD-10-CM | POA: Diagnosis not present

## 2020-11-06 DIAGNOSIS — J479 Bronchiectasis, uncomplicated: Secondary | ICD-10-CM | POA: Diagnosis not present

## 2020-11-06 DIAGNOSIS — J310 Chronic rhinitis: Secondary | ICD-10-CM | POA: Diagnosis not present

## 2020-11-06 DIAGNOSIS — G4733 Obstructive sleep apnea (adult) (pediatric): Secondary | ICD-10-CM | POA: Diagnosis not present

## 2020-11-06 DIAGNOSIS — J84112 Idiopathic pulmonary fibrosis: Secondary | ICD-10-CM | POA: Diagnosis not present

## 2020-11-06 DIAGNOSIS — G2581 Restless legs syndrome: Secondary | ICD-10-CM | POA: Diagnosis not present

## 2020-11-06 DIAGNOSIS — R945 Abnormal results of liver function studies: Secondary | ICD-10-CM | POA: Diagnosis not present

## 2020-11-15 DIAGNOSIS — R700 Elevated erythrocyte sedimentation rate: Secondary | ICD-10-CM | POA: Diagnosis not present

## 2020-11-15 DIAGNOSIS — J8410 Pulmonary fibrosis, unspecified: Secondary | ICD-10-CM | POA: Diagnosis not present

## 2020-11-15 DIAGNOSIS — M3500 Sicca syndrome, unspecified: Secondary | ICD-10-CM | POA: Diagnosis not present

## 2020-11-15 DIAGNOSIS — M25579 Pain in unspecified ankle and joints of unspecified foot: Secondary | ICD-10-CM | POA: Diagnosis not present

## 2020-11-15 DIAGNOSIS — R591 Generalized enlarged lymph nodes: Secondary | ICD-10-CM | POA: Diagnosis not present

## 2020-11-15 DIAGNOSIS — K743 Primary biliary cirrhosis: Secondary | ICD-10-CM | POA: Diagnosis not present

## 2020-11-15 DIAGNOSIS — M2550 Pain in unspecified joint: Secondary | ICD-10-CM | POA: Diagnosis not present

## 2020-11-15 DIAGNOSIS — J849 Interstitial pulmonary disease, unspecified: Secondary | ICD-10-CM | POA: Diagnosis not present

## 2020-11-15 DIAGNOSIS — R5383 Other fatigue: Secondary | ICD-10-CM | POA: Diagnosis not present

## 2020-11-15 DIAGNOSIS — R768 Other specified abnormal immunological findings in serum: Secondary | ICD-10-CM | POA: Diagnosis not present

## 2020-11-15 DIAGNOSIS — M549 Dorsalgia, unspecified: Secondary | ICD-10-CM | POA: Diagnosis not present

## 2020-11-20 DIAGNOSIS — G4733 Obstructive sleep apnea (adult) (pediatric): Secondary | ICD-10-CM | POA: Diagnosis not present

## 2020-11-25 DIAGNOSIS — J310 Chronic rhinitis: Secondary | ICD-10-CM | POA: Diagnosis not present

## 2020-11-25 DIAGNOSIS — J84112 Idiopathic pulmonary fibrosis: Secondary | ICD-10-CM | POA: Diagnosis not present

## 2020-11-25 DIAGNOSIS — J479 Bronchiectasis, uncomplicated: Secondary | ICD-10-CM | POA: Diagnosis not present

## 2020-11-25 DIAGNOSIS — G2581 Restless legs syndrome: Secondary | ICD-10-CM | POA: Diagnosis not present

## 2020-11-25 DIAGNOSIS — R945 Abnormal results of liver function studies: Secondary | ICD-10-CM | POA: Diagnosis not present

## 2020-11-25 DIAGNOSIS — G4733 Obstructive sleep apnea (adult) (pediatric): Secondary | ICD-10-CM | POA: Diagnosis not present

## 2020-11-25 DIAGNOSIS — J4520 Mild intermittent asthma, uncomplicated: Secondary | ICD-10-CM | POA: Diagnosis not present

## 2020-12-06 DIAGNOSIS — J84112 Idiopathic pulmonary fibrosis: Secondary | ICD-10-CM | POA: Diagnosis not present

## 2020-12-06 DIAGNOSIS — R945 Abnormal results of liver function studies: Secondary | ICD-10-CM | POA: Diagnosis not present

## 2020-12-06 DIAGNOSIS — J479 Bronchiectasis, uncomplicated: Secondary | ICD-10-CM | POA: Diagnosis not present

## 2020-12-06 DIAGNOSIS — G4733 Obstructive sleep apnea (adult) (pediatric): Secondary | ICD-10-CM | POA: Diagnosis not present

## 2020-12-06 DIAGNOSIS — J4520 Mild intermittent asthma, uncomplicated: Secondary | ICD-10-CM | POA: Diagnosis not present

## 2020-12-06 DIAGNOSIS — G2581 Restless legs syndrome: Secondary | ICD-10-CM | POA: Diagnosis not present

## 2020-12-06 DIAGNOSIS — J310 Chronic rhinitis: Secondary | ICD-10-CM | POA: Diagnosis not present

## 2020-12-13 ENCOUNTER — Ambulatory Visit: Payer: Medicare Other | Admitting: Cardiology

## 2020-12-17 DIAGNOSIS — E663 Overweight: Secondary | ICD-10-CM | POA: Diagnosis not present

## 2020-12-17 DIAGNOSIS — Z79899 Other long term (current) drug therapy: Secondary | ICD-10-CM | POA: Diagnosis not present

## 2020-12-17 DIAGNOSIS — Z6827 Body mass index (BMI) 27.0-27.9, adult: Secondary | ICD-10-CM | POA: Diagnosis not present

## 2020-12-17 DIAGNOSIS — F324 Major depressive disorder, single episode, in partial remission: Secondary | ICD-10-CM | POA: Diagnosis not present

## 2020-12-17 DIAGNOSIS — G2581 Restless legs syndrome: Secondary | ICD-10-CM | POA: Diagnosis not present

## 2020-12-17 DIAGNOSIS — D649 Anemia, unspecified: Secondary | ICD-10-CM | POA: Diagnosis not present

## 2020-12-17 DIAGNOSIS — E7800 Pure hypercholesterolemia, unspecified: Secondary | ICD-10-CM | POA: Diagnosis not present

## 2020-12-17 DIAGNOSIS — M810 Age-related osteoporosis without current pathological fracture: Secondary | ICD-10-CM | POA: Diagnosis not present

## 2020-12-17 DIAGNOSIS — K219 Gastro-esophageal reflux disease without esophagitis: Secondary | ICD-10-CM | POA: Diagnosis not present

## 2020-12-24 DIAGNOSIS — J8410 Pulmonary fibrosis, unspecified: Secondary | ICD-10-CM | POA: Diagnosis not present

## 2020-12-24 DIAGNOSIS — J84112 Idiopathic pulmonary fibrosis: Secondary | ICD-10-CM | POA: Diagnosis not present

## 2020-12-24 DIAGNOSIS — J438 Other emphysema: Secondary | ICD-10-CM | POA: Diagnosis not present

## 2020-12-24 DIAGNOSIS — I7789 Other specified disorders of arteries and arterioles: Secondary | ICD-10-CM | POA: Diagnosis not present

## 2020-12-24 DIAGNOSIS — J479 Bronchiectasis, uncomplicated: Secondary | ICD-10-CM | POA: Diagnosis not present

## 2020-12-26 DIAGNOSIS — M3500 Sicca syndrome, unspecified: Secondary | ICD-10-CM | POA: Diagnosis not present

## 2020-12-26 DIAGNOSIS — R5383 Other fatigue: Secondary | ICD-10-CM | POA: Diagnosis not present

## 2020-12-26 DIAGNOSIS — J8410 Pulmonary fibrosis, unspecified: Secondary | ICD-10-CM | POA: Diagnosis not present

## 2020-12-26 DIAGNOSIS — M2550 Pain in unspecified joint: Secondary | ICD-10-CM | POA: Diagnosis not present

## 2020-12-26 DIAGNOSIS — M549 Dorsalgia, unspecified: Secondary | ICD-10-CM | POA: Diagnosis not present

## 2020-12-26 DIAGNOSIS — M25579 Pain in unspecified ankle and joints of unspecified foot: Secondary | ICD-10-CM | POA: Diagnosis not present

## 2020-12-26 DIAGNOSIS — J849 Interstitial pulmonary disease, unspecified: Secondary | ICD-10-CM | POA: Diagnosis not present

## 2020-12-26 DIAGNOSIS — K743 Primary biliary cirrhosis: Secondary | ICD-10-CM | POA: Diagnosis not present

## 2020-12-26 DIAGNOSIS — R700 Elevated erythrocyte sedimentation rate: Secondary | ICD-10-CM | POA: Diagnosis not present

## 2020-12-26 DIAGNOSIS — R591 Generalized enlarged lymph nodes: Secondary | ICD-10-CM | POA: Diagnosis not present

## 2021-01-30 DIAGNOSIS — H40013 Open angle with borderline findings, low risk, bilateral: Secondary | ICD-10-CM | POA: Diagnosis not present

## 2021-03-13 DIAGNOSIS — H353131 Nonexudative age-related macular degeneration, bilateral, early dry stage: Secondary | ICD-10-CM | POA: Diagnosis not present

## 2021-03-13 DIAGNOSIS — H40013 Open angle with borderline findings, low risk, bilateral: Secondary | ICD-10-CM | POA: Diagnosis not present

## 2021-03-18 DIAGNOSIS — R945 Abnormal results of liver function studies: Secondary | ICD-10-CM | POA: Diagnosis not present

## 2021-03-18 DIAGNOSIS — J310 Chronic rhinitis: Secondary | ICD-10-CM | POA: Diagnosis not present

## 2021-03-18 DIAGNOSIS — J84112 Idiopathic pulmonary fibrosis: Secondary | ICD-10-CM | POA: Diagnosis not present

## 2021-03-18 DIAGNOSIS — J479 Bronchiectasis, uncomplicated: Secondary | ICD-10-CM | POA: Diagnosis not present

## 2021-03-18 DIAGNOSIS — J4520 Mild intermittent asthma, uncomplicated: Secondary | ICD-10-CM | POA: Diagnosis not present

## 2021-03-18 DIAGNOSIS — G2581 Restless legs syndrome: Secondary | ICD-10-CM | POA: Diagnosis not present

## 2021-03-18 DIAGNOSIS — G4733 Obstructive sleep apnea (adult) (pediatric): Secondary | ICD-10-CM | POA: Diagnosis not present

## 2021-03-19 DIAGNOSIS — R161 Splenomegaly, not elsewhere classified: Secondary | ICD-10-CM | POA: Diagnosis not present

## 2021-03-19 DIAGNOSIS — J439 Emphysema, unspecified: Secondary | ICD-10-CM | POA: Diagnosis not present

## 2021-03-19 DIAGNOSIS — R911 Solitary pulmonary nodule: Secondary | ICD-10-CM | POA: Diagnosis not present

## 2021-03-19 DIAGNOSIS — I700 Atherosclerosis of aorta: Secondary | ICD-10-CM | POA: Diagnosis not present

## 2021-03-20 DIAGNOSIS — Z6827 Body mass index (BMI) 27.0-27.9, adult: Secondary | ICD-10-CM | POA: Diagnosis not present

## 2021-03-20 DIAGNOSIS — E7800 Pure hypercholesterolemia, unspecified: Secondary | ICD-10-CM | POA: Diagnosis not present

## 2021-03-20 DIAGNOSIS — F3341 Major depressive disorder, recurrent, in partial remission: Secondary | ICD-10-CM | POA: Diagnosis not present

## 2021-03-20 DIAGNOSIS — M3500 Sicca syndrome, unspecified: Secondary | ICD-10-CM | POA: Diagnosis not present

## 2021-03-20 DIAGNOSIS — D649 Anemia, unspecified: Secondary | ICD-10-CM | POA: Diagnosis not present

## 2021-03-20 DIAGNOSIS — Z79899 Other long term (current) drug therapy: Secondary | ICD-10-CM | POA: Diagnosis not present

## 2021-03-20 DIAGNOSIS — R911 Solitary pulmonary nodule: Secondary | ICD-10-CM | POA: Diagnosis not present

## 2021-03-20 DIAGNOSIS — J84113 Idiopathic non-specific interstitial pneumonitis: Secondary | ICD-10-CM | POA: Diagnosis not present

## 2021-03-26 DIAGNOSIS — J310 Chronic rhinitis: Secondary | ICD-10-CM | POA: Diagnosis not present

## 2021-03-26 DIAGNOSIS — G4733 Obstructive sleep apnea (adult) (pediatric): Secondary | ICD-10-CM | POA: Diagnosis not present

## 2021-03-26 DIAGNOSIS — J479 Bronchiectasis, uncomplicated: Secondary | ICD-10-CM | POA: Diagnosis not present

## 2021-03-26 DIAGNOSIS — R911 Solitary pulmonary nodule: Secondary | ICD-10-CM | POA: Diagnosis not present

## 2021-03-26 DIAGNOSIS — J4520 Mild intermittent asthma, uncomplicated: Secondary | ICD-10-CM | POA: Diagnosis not present

## 2021-03-26 DIAGNOSIS — R945 Abnormal results of liver function studies: Secondary | ICD-10-CM | POA: Diagnosis not present

## 2021-03-26 DIAGNOSIS — J84112 Idiopathic pulmonary fibrosis: Secondary | ICD-10-CM | POA: Diagnosis not present

## 2021-03-26 DIAGNOSIS — G2581 Restless legs syndrome: Secondary | ICD-10-CM | POA: Diagnosis not present

## 2021-04-08 DIAGNOSIS — R911 Solitary pulmonary nodule: Secondary | ICD-10-CM | POA: Diagnosis not present

## 2021-04-09 DIAGNOSIS — J4520 Mild intermittent asthma, uncomplicated: Secondary | ICD-10-CM | POA: Diagnosis not present

## 2021-04-09 DIAGNOSIS — J310 Chronic rhinitis: Secondary | ICD-10-CM | POA: Diagnosis not present

## 2021-04-09 DIAGNOSIS — G4733 Obstructive sleep apnea (adult) (pediatric): Secondary | ICD-10-CM | POA: Diagnosis not present

## 2021-04-09 DIAGNOSIS — R945 Abnormal results of liver function studies: Secondary | ICD-10-CM | POA: Diagnosis not present

## 2021-04-09 DIAGNOSIS — G2581 Restless legs syndrome: Secondary | ICD-10-CM | POA: Diagnosis not present

## 2021-04-09 DIAGNOSIS — R911 Solitary pulmonary nodule: Secondary | ICD-10-CM | POA: Diagnosis not present

## 2021-04-09 DIAGNOSIS — J84112 Idiopathic pulmonary fibrosis: Secondary | ICD-10-CM | POA: Diagnosis not present

## 2021-04-09 DIAGNOSIS — J479 Bronchiectasis, uncomplicated: Secondary | ICD-10-CM | POA: Diagnosis not present

## 2021-04-16 DIAGNOSIS — J84112 Idiopathic pulmonary fibrosis: Secondary | ICD-10-CM | POA: Diagnosis not present

## 2021-04-16 DIAGNOSIS — J4520 Mild intermittent asthma, uncomplicated: Secondary | ICD-10-CM | POA: Diagnosis not present

## 2021-04-16 DIAGNOSIS — J479 Bronchiectasis, uncomplicated: Secondary | ICD-10-CM | POA: Diagnosis not present

## 2021-04-16 DIAGNOSIS — G2581 Restless legs syndrome: Secondary | ICD-10-CM | POA: Diagnosis not present

## 2021-04-16 DIAGNOSIS — R945 Abnormal results of liver function studies: Secondary | ICD-10-CM | POA: Diagnosis not present

## 2021-04-16 DIAGNOSIS — G4733 Obstructive sleep apnea (adult) (pediatric): Secondary | ICD-10-CM | POA: Diagnosis not present

## 2021-04-16 DIAGNOSIS — J310 Chronic rhinitis: Secondary | ICD-10-CM | POA: Diagnosis not present

## 2021-04-16 DIAGNOSIS — R911 Solitary pulmonary nodule: Secondary | ICD-10-CM | POA: Diagnosis not present

## 2021-04-22 DIAGNOSIS — R911 Solitary pulmonary nodule: Secondary | ICD-10-CM | POA: Diagnosis not present

## 2021-04-29 ENCOUNTER — Telehealth: Payer: Self-pay | Admitting: Oncology

## 2021-04-29 NOTE — Telephone Encounter (Signed)
Patient referred by Dr Gardiner Rhyme for Lung Nodules.  Appt made 05/07/21 Labs 2:30 pm - Consult 3:00 pm   Waiting Path Report

## 2021-05-06 NOTE — Progress Notes (Signed)
[facility]  [address] [place],  [place]  [address] [phone]  Clinic Day:  05/07/2021  Referring physician: Greig Right, MD  This document serves as a record of services personally performed by Hosie Poisson, MD. It was created on their behalf by [facility], a trained medical scribe. The creation of this record is based on the scribe's personal observations and the provider's statements to them.  CHIEF COMPLAINT:  CC: Suspicious lung nodule  Current Treatment:  Still finalizing treatment plan   HISTORY OF PRESENT ILLNESS:  Sherry Wang is a 80 y.o. female referred by Dr. Alcide Clever for the evaluation and treatment of a hypermetabolic lung nodule. This was observed on CT chest from May 2022, which revealed a spiculated appearing nodule in the dependent left lung base measuring 1.7 x 1.3 cm, which has clearly enlarged over sequential prior examinations but has been somewhat difficult to evaluate given the presence of pleural effusions and fibrosis on most recent examinations. PET was pursued on August 3rd and confirmed intense FDG uptake associated with the left lower lobe lung nodule which increased in size, measuring 2.9 x 1.5 cm, suspicious for underlying malignancy. No FDG avid mediastinal or hilar lymph nodes or signs of distant metastatic disease. 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 She was referred to Dr. Alcide Clever for bronchoscopy with EBUS which was pursued on August 23rd and was negative. Fine needle aspiration of mediastinal lymph nodes was obtained and cytopathology confirmed focal lymphocytes and rare bronchial cells in the background of abundant blood. No evidence of metastases.   INTERVAL HISTORY:  Sherry Wang states that she feels poorly with fatigue and generalized weakness. She is severely hypotensive today, and states that she has not had any blood pressure medication  in over a year. She states that she has had hypotension before. She denies dizziness. She has seen cardiology in the past, Dr. Harriet Masson. She does have significant pulmonary fibrosis and is currently on OFEV 150 mg daily. This causes some mild nausea. She reports a 30 pound weight loss over the past year.  She reports occasional mid sternal pain. She has cough with yellow sputum, which is chronic. She denies hemoptysis. She has arthritis. She does report poor memory and her son helps with her history. Blood counts and chemistries are unremarkable except for a calcium of 10.7, and an alkaline phosphatase of 277. She is on vitamin D but not a calcium supplement. 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 Her  appetite is good, and she is eating well. She denies fever, chills or other signs of infection.  She denies vomiting, bowel issues, or abdominal pain.  She denies sore throat or chest pain.  REVIEW OF SYSTEMS:  Review of Systems  Constitutional:  Positive for fatigue and unexpected weight change (30 pounds within the last year). Negative for appetite change, chills and fever.       Generalized weakness  HENT:  Negative.    Eyes: Negative.   Respiratory:  Positive for cough (productive with yellow sputum) and shortness of breath (with mild exertion, uses home oxygen). Negative for chest tightness, hemoptysis and wheezing.   Cardiovascular:  Positive for chest pain (intermittent, mid sternal). Negative for leg swelling and palpitations.  Gastrointestinal:  Positive for nausea (due to medication). Negative for abdominal distention, abdominal pain, blood in stool, constipation, diarrhea and vomiting.  Endocrine: Negative.   Genitourinary: Negative.  Negative for difficulty urinating,  dysuria, frequency and hematuria.   Musculoskeletal:  Positive for arthralgias. Negative for back pain, flank pain, gait problem and myalgias.  Skin: Negative.   Neurological: Negative.  Negative for  dizziness, extremity weakness, gait problem, headaches, light-headedness, numbness, seizures and speech difficulty.  Hematological: Negative.   Psychiatric/Behavioral:  Positive for confusion (poor memory). Negative for depression and sleep disturbance. The patient is nervous/anxious.     VITALS:  Blood pressure (!) 88/44, pulse 82, temperature 97.7 F (36.5 C), temperature source Oral, resp. rate 20, height 5\' 2"  (1.575 m), weight 141 lb 9.6 oz (64.2 kg), SpO2 97 %.  Wt Readings from Last 3 Encounters:  05/07/21 141 lb 9.6 oz (64.2 kg)  10/21/20 143 lb 3.2 oz (65 kg)  09/13/20 146 lb 6.4 oz (66.4 kg)    Body mass index is 25.9 kg/m.  Performance status (ECOG): 1 - Symptomatic but completely ambulatory  PHYSICAL EXAM:  Physical Exam Constitutional:      General: She is not in acute distress.    Appearance: Normal appearance. She is normal weight.  HENT:     Head: Normocephalic and atraumatic.  Eyes:     General: No scleral icterus.    Extraocular Movements: Extraocular movements intact.     Conjunctiva/sclera: Conjunctivae normal.     Pupils: Pupils are equal, round, and reactive to light.  Cardiovascular:     Rate and Rhythm: Normal rate and regular rhythm.     Pulses: Normal pulses.     Heart sounds: Normal heart sounds. No murmur heard.   No friction rub. No gallop.  Pulmonary:     Effort: Pulmonary effort is normal. No respiratory distress.     Breath sounds: Rales (extensive at both bases at least halfway up the lung) present.  Chest:  Breasts:    Right: Normal.     Left: Normal.     Comments: Both breasts are without masses. Abdominal:     General: Bowel sounds are normal. There is no distension.     Palpations: Abdomen is soft. There is hepatomegaly (a few cm below the right costal margin, slightly nodular) and splenomegaly (7-8 cm below the left costal margin). There is no mass.     Tenderness: There is no abdominal tenderness.  Musculoskeletal:        General:  Normal range of motion.     Cervical back: Normal range of motion and neck supple.     Right lower leg: No edema.     Left lower leg: No edema.  Lymphadenopathy:     Cervical: No cervical adenopathy.  Skin:    General: Skin is warm and dry.  Neurological:     General: No focal deficit present.     Mental Status: She is alert and oriented to person, place, and time. Mental status is at baseline.  Psychiatric:        Mood and Affect: Mood normal.        Behavior: Behavior normal.        Thought Content: Thought content normal.        Judgment: Judgment normal.    LABS:   CBC Latest Ref Rng & Units 05/07/2021 09/13/2020 03/01/2018  WBC - 7.1 6.3 7.0  Hemoglobin 12.0 - 16.0 13.2 12.1 13.0  Hematocrit 36 - 46 40 38.9 39.9  Platelets 150 - 399 221 240 236.0   CMP Latest Ref Rng & Units 05/07/2021 04/20/2018 03/01/2018  Glucose 65 - 99 mg/dL - 82 97  BUN 4 -  21 17 15 14   Creatinine 0.5 - 1.1 1.0 0.98 0.99  Sodium 137 - 147 138 142 138  Potassium 3.4 - 5.3 4.0 4.1 4.1  Chloride 99 - 108 110(A) 105 104  CO2 13 - 22 18 22 28   Calcium 8.7 - 10.7 10.6 10.2 11.0(H)  Total Protein 6.0 - 8.3 g/dL - - 7.7  Total Bilirubin 0.2 - 1.2 mg/dL - - 0.4  Alkaline Phos 25 - 125 277(A) - 120(H)  AST 13 - 35 38(A) - 20  ALT 7 - 35 21 - 14     No results found for: CEA1 / No results found for: CEA1  Lab Results  Component Value Date   TIBC 281 09/18/2020   FERRITIN 41 09/18/2020   FERRITIN 10.6 10/05/2018   FERRITIN 11.5 03/01/2018   IRONPCTSAT 14 (L) 09/18/2020   No results found for: LDH  STUDIES:  No results found.   EXAM: 12/24/2020 CT CHEST WITH CONTRAST  TECHNIQUE: Multidetector CT imaging of the chest was performed during intravenous contrast administration.  CONTRAST:  60 mL Isovue 370 iodinated contrast IV  COMPARISON:  CT chest angiogram, 07/19/2020, 04/28/2020, CT chest, 12/27/2019  FINDINGS: Cardiovascular: Aortic atherosclerosis. Normal heart size. No pericardial  effusion. Enlargement of the main pulmonary artery measuring up to 3.5 cm in caliber.  Mediastinum/Nodes: No enlarged mediastinal, hilar, or axillary lymph nodes. Thyroid gland, trachea, and esophagus demonstrate no significant findings.  Lungs/Pleura: Redemonstrated moderate pulmonary fibrosis in a pattern with apical to basal gradient featuring irregular peripheral interstitial opacity, septal thickening, subpleural bronchiolectasis, and areas of honeycombing at the lung bases. There is minimal underlying paraseptal emphysema. There is a spiculated appearing nodule in the dependent left lung base measuring 1.7 x 1.3 cm, which has clearly enlarged over sequential prior examinations but has been somewhat difficult to evaluate given the presence of pleural effusions on most recent examinations (series 4, image 65). No pleural effusion or pneumothorax.  Upper Abdomen: No acute abnormality.  Musculoskeletal: No chest wall mass or suspicious bone lesions identified.  IMPRESSION: 1. Redemonstrated moderate pulmonary fibrosis in a pattern with apical to basal gradient featuring irregular peripheral interstitial opacity, septal thickening, subpleural bronchiolectasis, and areas of honeycombing at the lung bases. Findings are consistent with UIP per consensus guidelines: Diagnosis of Idiopathic Pulmonary Fibrosis: An Official ATS/ERS/JRS/ALAT Clinical Practice Guideline. [place], Iss 5, [phone], Apr 17 2017. 2. Minimal paraseptal emphysema. 3. There is a spiculated appearing nodule in the dependent left lung base measuring 1.7 x 1.3 cm, which has clearly enlarged over sequential prior examinations but has been somewhat difficult to evaluate given the presence of pleural effusions on most recent examinations. This may reflect confluent fibrosis but is concerning for primary lung malignancy in the high risk setting of pulmonary fibrosis. This is likely of  sufficient size to assess for metabolic activity by PET-CT. Recommend multidisciplinary thoracic referral. 4. Enlargement of the main pulmonary artery measuring up to 3.5 cm in caliber, as can be seen in pulmonary hypertension.  EXAM: 03/19/2021 NUCLEAR MEDICINE PET SKULL BASE TO THIGH   TECHNIQUE:  12.0 mCi F-18 FDG was injected intravenously. Full-ring PET imaging  was performed from the skull base to thigh after the radiotracer. CT  data was obtained and used for attenuation correction and anatomic  localization.   Fasting blood glucose: 99 mg/dl   COMPARISON:  12/24/2020   FINDINGS:  Mediastinal blood pool activity: SUV max 2.88   Liver activity: SUV max  NA   NECK: No hypermetabolic lymph nodes in the neck.   Incidental CT findings: none   CHEST: Interval increase in size of left lower lobe lung nodule. On  today's exam this measures 2.9 x 1.5 cm with SUV max of 13.84, image  110/3. Previously this measured 1.7 x 1.3 cm. No additional FDG avid  pulmonary nodules.   No FDG avid mediastinal or hilar lymph nodes.   Incidental CT findings: Paraseptal and centrilobular emphysema.  Signs of chronic interstitial lung disease are again noted,  previously characterized as usual interstitial pneumonitis (UIP).   Aortic atherosclerosis.   ABDOMEN/PELVIS: No abnormal FDG uptake within the liver, pancreas,  spleen, or adrenal glands. No FDG avid abdominopelvic lymph nodes.   Incidental CT findings: The spleen is enlarged measuring 13 cm in  cranial caudal dimension. Status post cholecystectomy. Aortic  atherosclerosis. No aneurysm. Partially calcified fibroid uterus.   SKELETON: No focal hypermetabolic activity to suggest skeletal  metastasis.   Incidental CT findings: none   IMPRESSION:  1. There is intense FDG uptake associated with the left lower lobe  lung nodule which has increased in size since the previous exam.  Suspicious for underlying malignancy.  2. No FDG  avid mediastinal or hilar lymph nodes or signs of distant  metastatic disease.  3. Signs of chronic interstitial lung disease, recently  characterized as usual interstitial pneumonitis (UIP).  4. Aortic Atherosclerosis (ICD10-I70.0) and Emphysema (ICD10-J43.9).  5. Splenomegaly.     A.  MEDIASTINAL LYMPH NODE STATION 11 R, FINE-NEEDLE ASPIRATION:             -  FOCAL LYMPHOCYTES AND RARE BRONCHIAL CELLS IN THE BACKGROUND OF ABUNDANT BLOOD.             -  NO EVIDENCE OF METASTASES. B.  MEDIASTINAL LYMPH NODE STATION 4R, FINE-NEEDLE ASPIRATION:              -  SCANT BRONCHIAL CELLS IN THE BACKGROUND OF ABUNDANT BLOOD.              -  NO EVIDENCE OF METASTASES. C.  MEDIASTINAL LYMPH NODE STATION 7S, FINE-NEEDLE ASPIRATION:              - SCANT BRONCHIAL CELLS IN THE BACKGROUND OF ABUNDANT BLOOD.              -  NO EVIDENCE OF METASTASES. D.  MEDIASTINAL LYMPH NODE STATION 11 L, FINE-NEEDLE ASPIRATION:             -   LYMPHOCYTES IN THE BACKGROUND OF ABUNDANT BLOOD.              -  NO EVIDENCE OF METASTASES.  HISTORY:   Past Medical History:  Diagnosis Date   Anemia    Anemia, iron deficiency 11/28/2014   Anxiety    Asthma    AVM (arteriovenous malformation) of small bowel, acquired    Biliary cirrhosis (HCC)    Cirrhosis ([place])    Depression    Dyspnea 02/14/2019   Family history of adverse reaction to anesthesia    sister had hysteria after anesthesia   Fast heart beat    occ   Fibromyalgia    Gallstones    GERD (gastroesophageal reflux disease)    GI bleeding    Headache    History of cataract    History of home oxygen therapy    2 liters at hs only   Hyperlipemia    Junctional bradycardia  Low blood pressure    Non-Q wave non-ST elevation myocardial infarction (NSTEMI) ([place]) 2004   Osteoporosis    Pneumonia    last 2016   Primary biliary cirrhosis ([place]) 11/28/2014   Pulmonary fibrosis ([place])    Sleep apnea    has had sleep study   Tingling    left foot sing  back surgery    Wears glasses    Reading   Wears partial dentures    lower    Past Surgical History:  Procedure Laterality Date   CARDIAC CATHETERIZATION  2004   CATARACT EXTRACTION, BILATERAL     CHOLECYSTECTOMY     COLONOSCOPY  11/08/2008   COLONOSCOPY WITH PROPOFOL N/A 02/21/2019   Procedure: COLONOSCOPY WITH PROPOFOL;  Surgeon: Mauri Pole, MD;  Location: [facility];  Service: Endoscopy;  Laterality: N/A;   ESOPHAGOGASTRODUODENOSCOPY (EGD) WITH PROPOFOL N/A 02/02/2017   Procedure: ESOPHAGOGASTRODUODENOSCOPY (EGD) WITH PROPOFOL;  Surgeon: Mauri Pole, MD;  Location: [facility];  Service: Endoscopy;  Laterality: N/A;   ESOPHAGOGASTRODUODENOSCOPY (EGD) WITH PROPOFOL N/A 02/21/2019   Procedure: ESOPHAGOGASTRODUODENOSCOPY (EGD) WITH PROPOFOL;  Surgeon: Mauri Pole, MD;  Location: [facility];  Service: Endoscopy;  Laterality: N/A;   LAMINECTOMY  age 48   SHOULDER SURGERY Left    TOE SURGERY Right    TONSILLECTOMY     as a child   WRIST FRACTURE SURGERY Left     Family History  Problem Relation Age of Onset   Asthma Father    Leukemia Father        died age 10   Diabetes type II Father    Cervical cancer Paternal Grandmother    Alzheimer's disease Mother    Hypertension Mother    Heart disease Maternal Uncle        x2   Colon polyps Paternal Aunt    Stroke Maternal Aunt    Stroke Sister    Sleep apnea Sister    Diabetes type II Sister    Obesity Sister    Hypertension Brother    Colon cancer Neg Hx    Esophageal cancer Neg Hx    Gallbladder disease Neg Hx     Social History:  reports that she quit smoking about 37 years ago. Her smoking use included cigarettes. She has a 30.00 pack-year smoking history. She has never used smokeless tobacco. She reports that she does not currently use alcohol. She reports that she does not use drugs.The patient is accompanied by her son today and requests that we mainly communicate with him as she is forgetful.  She is widowed and lives at home with her son.  She has 1 son. She is retired, and has never been exposed to chemicals.  Allergies:  Allergies  Allergen Reactions   Alendronate Sodium     Other reaction(s): Unknown   Ativan [Lorazepam] Other (See Comments)    Hallucinations   Oseltamivir     Other reaction(s): Unknown   Paroxetine Hcl Itching    Other reaction(s): Unknown   Tamiflu [Oseltamivir Phosphate] Nausea And Vomiting    "I just get sick"    Current Medications: Current Outpatient Medications  Medication Sig Dispense Refill   acetaminophen (TYLENOL) 325 MG tablet Take 650 mg by mouth every 6 (six) hours as needed for moderate pain or headache.      albuterol (PROVENTIL HFA;VENTOLIN HFA) 108 (90 Base) MCG/ACT inhaler Inhale 2 puffs into the lungs every 6 (six) hours as needed for wheezing or shortness of breath.  budesonide-formoterol (SYMBICORT) 160-4.5 MCG/ACT inhaler Inhale 2 puffs into the lungs 2 (two) times daily.      calcium carbonate (TUMS - DOSED IN MG ELEMENTAL CALCIUM) 500 MG chewable tablet Chew 2 tablets by mouth daily as needed for indigestion or heartburn.      Cholecalciferol 50 MCG (2000 UT) TABS Take 1 tablet by mouth daily.     cyanocobalamin 1000 MCG tablet Take 1,000 mcg by mouth every evening.     diclofenac Sodium (VOLTAREN) 1 % GEL 4 (four) times daily as needed.     Ferrous Gluconate-C-Folic Acid (IRON-C PO) Take by mouth daily.     fluticasone (FLONASE) 50 MCG/ACT nasal spray Place 2 sprays into both nostrils every evening.      gabapentin (NEURONTIN) 300 MG capsule Take 300 mg by mouth at bedtime.     melatonin 5 MG TABS 1 tablet in the evening     Nintedanib (OFEV) 150 MG CAPS Take 150 mg by mouth daily.     nitroGLYCERIN (NITROSTAT) 0.4 MG SL tablet Place 0.4 mg under the tongue every 5 (five) minutes as needed for chest pain.      OXYGEN Inhale into the lungs at bedtime. 2 liters at bedtime     rosuvastatin (CRESTOR) 20 MG tablet Take 20 mg  by mouth at bedtime.   2   sertraline (ZOLOFT) 100 MG tablet Take 200 mg by mouth at bedtime.      No current facility-administered medications for this visit.     ASSESSMENT & PLAN:   Assessment:   Suspicious and enlarging left lower lobe lung nodule now measuring 2.9 x 1.5 cm. 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 Four mediastinal lymph nodes were negative for malignancy, but she is not really a good candidate for surgery due to her respiratory compromise.  For pathologic confirmation, we would need to pursue biopsy of the lung nodule, but with her significant pulmonary fibrosis and other co-morbidities, this is not without high risks.  Significant pulmonary fibrosis, for the past 5-8 years. She has been on prednisone intermittently, and is currently on OFEV 150 mg daily.  Severe hypotension. The patient states that she has had this before, and has been off all blood pressure medication for the past year. Her son thinks he may have medicines at home to raise her blood pressure but she has not been symptomatic.  Primary biliary cirrhosis.  Hypercalcemia. This could be related to underlying malignancy. She is not on oral calcium other than TUMS, but she does not take this often.   Plan: This is a pleasant 80 year old female with a left lower lobe lung nodule measuring 2.9 x 1.5 cm, confirmed hypermetabolic on PET and suspicious for underlying malignancy. Four mediastinal lymph nodes were negative for malignancy, but she is not a good surgical candidate in my opinion. A biopsy would normally need to be pursued for pathologic confirmation. However, with her significant pulmonary fibrosis, this is not without high risks. As this is a solitary lesion, she would benefit from stereotactic radiation, which could be pursued at [facility] in [place]. I will plan to refer her for consultation. A CEA has been ordered. Dr. Alcide Clever has  obtained PFTs and I will request these results.  I reviewed the CT and PET results and images in detail with them. She and her son understand and agree with this plan of care. I have answered their questions  and they know to call with any concerns.  Thank you for the opportunity to participate in the care of your patients   I provided 50 minutes of face-to-face time during this this encounter and > 50% was spent counseling as documented under my assessment and plan.    Derwood Kaplan, MD [facility] [address] [place] [address] Dept: [phone] Dept Fax: [phone]   I, Rita Ohara, am acting as scribe for Derwood Kaplan, MD  I have reviewed this report as typed by the medical scribe, and it is complete and accurate.  Hermina Barters

## 2021-05-07 ENCOUNTER — Other Ambulatory Visit: Payer: Self-pay | Admitting: Hematology and Oncology

## 2021-05-07 ENCOUNTER — Other Ambulatory Visit: Payer: Self-pay | Admitting: Oncology

## 2021-05-07 ENCOUNTER — Inpatient Hospital Stay: Payer: Medicare Other | Attending: Oncology

## 2021-05-07 ENCOUNTER — Encounter: Payer: Self-pay | Admitting: Oncology

## 2021-05-07 ENCOUNTER — Inpatient Hospital Stay (INDEPENDENT_AMBULATORY_CARE_PROVIDER_SITE_OTHER): Payer: Medicare Other | Admitting: Oncology

## 2021-05-07 DIAGNOSIS — K743 Primary biliary cirrhosis: Secondary | ICD-10-CM

## 2021-05-07 DIAGNOSIS — R161 Splenomegaly, not elsewhere classified: Secondary | ICD-10-CM | POA: Diagnosis not present

## 2021-05-07 DIAGNOSIS — J90 Pleural effusion, not elsewhere classified: Secondary | ICD-10-CM | POA: Diagnosis not present

## 2021-05-07 DIAGNOSIS — I959 Hypotension, unspecified: Secondary | ICD-10-CM | POA: Insufficient documentation

## 2021-05-07 DIAGNOSIS — C3432 Malignant neoplasm of lower lobe, left bronchus or lung: Secondary | ICD-10-CM | POA: Diagnosis not present

## 2021-05-07 DIAGNOSIS — Z8049 Family history of malignant neoplasm of other genital organs: Secondary | ICD-10-CM

## 2021-05-07 DIAGNOSIS — Z87891 Personal history of nicotine dependence: Secondary | ICD-10-CM | POA: Diagnosis not present

## 2021-05-07 DIAGNOSIS — Z79899 Other long term (current) drug therapy: Secondary | ICD-10-CM | POA: Diagnosis not present

## 2021-05-07 DIAGNOSIS — R918 Other nonspecific abnormal finding of lung field: Secondary | ICD-10-CM

## 2021-05-07 DIAGNOSIS — R911 Solitary pulmonary nodule: Secondary | ICD-10-CM

## 2021-05-07 DIAGNOSIS — J8410 Pulmonary fibrosis, unspecified: Secondary | ICD-10-CM | POA: Insufficient documentation

## 2021-05-07 DIAGNOSIS — D649 Anemia, unspecified: Secondary | ICD-10-CM | POA: Diagnosis not present

## 2021-05-07 LAB — CBC AND DIFFERENTIAL
HCT: 40 (ref 36–46)
Hemoglobin: 13.2 (ref 12.0–16.0)
Neutrophils Absolute: 4.54
Platelets: 221 (ref 150–399)
WBC: 7.1

## 2021-05-07 LAB — BASIC METABOLIC PANEL
BUN: 17 (ref 4–21)
CO2: 18 (ref 13–22)
Chloride: 110 — AB (ref 99–108)
Creatinine: 1 (ref 0.5–1.1)
Glucose: 89
Potassium: 4 (ref 3.4–5.3)
Sodium: 138 (ref 137–147)

## 2021-05-07 LAB — HEPATIC FUNCTION PANEL
ALT: 21 (ref 7–35)
AST: 38 — AB (ref 13–35)
Alkaline Phosphatase: 277 — AB (ref 25–125)
Bilirubin, Total: 0.3

## 2021-05-07 LAB — CBC
MCV: 85 (ref 81–99)
RBC: 4.66 (ref 3.87–5.11)

## 2021-05-07 LAB — COMPREHENSIVE METABOLIC PANEL
Albumin: 3.9 (ref 3.5–5.0)
Calcium: 10.6 (ref 8.7–10.7)

## 2021-05-08 ENCOUNTER — Other Ambulatory Visit: Payer: Self-pay

## 2021-05-08 ENCOUNTER — Telehealth: Payer: Self-pay

## 2021-05-08 DIAGNOSIS — C3432 Malignant neoplasm of lower lobe, left bronchus or lung: Secondary | ICD-10-CM

## 2021-05-08 DIAGNOSIS — R911 Solitary pulmonary nodule: Secondary | ICD-10-CM

## 2021-05-08 LAB — CEA: CEA: 22.3 ng/mL — ABNORMAL HIGH (ref 0.0–4.7)

## 2021-05-08 NOTE — Telephone Encounter (Signed)
Referral sent through Epic and was received

## 2021-05-08 NOTE — Telephone Encounter (Signed)
Referral electronically sent

## 2021-05-08 NOTE — Telephone Encounter (Signed)
-----   Message from Derwood Kaplan, MD sent at 05/07/2021  6:29 PM EDT ----- Regarding: referral Pls refer to Radiation Oncology at [facility] for consideration of SBRT, new lung cancer but no tissue dx

## 2021-05-08 NOTE — Telephone Encounter (Signed)
-----   Message from Derwood Kaplan, MD sent at 05/07/2021  6:29 PM EDT ----- Regarding: referral Pls refer to Radiation Oncology at [facility] for consideration of SBRT, new lung cancer but no tissue dx

## 2021-05-12 DIAGNOSIS — C3430 Malignant neoplasm of lower lobe, unspecified bronchus or lung: Secondary | ICD-10-CM | POA: Insufficient documentation

## 2021-05-14 ENCOUNTER — Telehealth: Payer: Self-pay

## 2021-05-14 DIAGNOSIS — G4733 Obstructive sleep apnea (adult) (pediatric): Secondary | ICD-10-CM | POA: Diagnosis not present

## 2021-05-14 DIAGNOSIS — J4520 Mild intermittent asthma, uncomplicated: Secondary | ICD-10-CM | POA: Diagnosis not present

## 2021-05-14 DIAGNOSIS — G2581 Restless legs syndrome: Secondary | ICD-10-CM | POA: Diagnosis not present

## 2021-05-14 DIAGNOSIS — J479 Bronchiectasis, uncomplicated: Secondary | ICD-10-CM | POA: Diagnosis not present

## 2021-05-14 DIAGNOSIS — R911 Solitary pulmonary nodule: Secondary | ICD-10-CM | POA: Diagnosis not present

## 2021-05-14 DIAGNOSIS — J310 Chronic rhinitis: Secondary | ICD-10-CM | POA: Diagnosis not present

## 2021-05-14 DIAGNOSIS — J84112 Idiopathic pulmonary fibrosis: Secondary | ICD-10-CM | POA: Diagnosis not present

## 2021-05-14 DIAGNOSIS — R945 Abnormal results of liver function studies: Secondary | ICD-10-CM | POA: Diagnosis not present

## 2021-05-14 NOTE — Progress Notes (Signed)
Location of tumor and Histology per Pathology Report: LLL lung  Bronchoscopy w EBUS:  04/08/2021 A.  MEDIASTINAL LYMPH NODE STATION 11 R, FINE-NEEDLE ASPIRATION:             -  FOCAL LYMPHOCYTES AND RARE BRONCHIAL CELLS IN THE BACKGROUND OF ABUNDANT BLOOD.             -  NO EVIDENCE OF METASTASES. B.  MEDIASTINAL LYMPH NODE STATION 4R, FINE-NEEDLE ASPIRATION:              -  SCANT BRONCHIAL CELLS IN THE BACKGROUND OF ABUNDANT BLOOD.              -  NO EVIDENCE OF METASTASES. C.  MEDIASTINAL LYMPH NODE STATION 7S, FINE-NEEDLE ASPIRATION:              - SCANT BRONCHIAL CELLS IN THE BACKGROUND OF ABUNDANT BLOOD.              -  NO EVIDENCE OF METASTASES. D.  MEDIASTINAL LYMPH NODE STATION 11 L, FINE-NEEDLE ASPIRATION:             -   LYMPHOCYTES IN THE BACKGROUND OF ABUNDANT BLOOD.              -  NO EVIDENCE OF METASTASES.  Past/Anticipated interventions by surgeon, if any:   Bronchoscopy w EBUS not a good surgical candidate. A biopsy would normally need to be pursued for pathologic confirmation. However, with her significant pulmonary fibrosis, this is not without high risks  Past/Anticipated interventions by medical oncology, if any: Derwood Kaplan, MD left lower lobe lung nodule measuring 2.9 x 1.5 cm, confirmed hypermetabolic on PET and suspicious for underlying malignancy. Four mediastinal lymph nodes were negative for malignancy, but she is not a good surgical candidate in my opinion. A biopsy would normally need to be pursued for pathologic confirmation. However, with her significant pulmonary fibrosis, this is not without high risks    Pain issues, if any:  yes, 6/10 left hip pain intermittent and sharp  SAFETY ISSUES: Prior radiation? no Pacemaker/ICD? no Possible current pregnancy? No, postmenopausal Is the patient on methotrexate? no  Current Complaints / other details:  6 pound weight loss in last month.     Vitals:   05/19/21 1005  BP: 122/77  Pulse: (!) 58   Resp: 16  Temp: (!) 97.5 F (36.4 C)  TempSrc: Temporal  SpO2: 99%  Weight: 134 lb 12.8 oz (61.1 kg)  Height: 5\' 2"  (1.575 m)

## 2021-05-14 NOTE — Telephone Encounter (Signed)
-----   Message from Derwood Kaplan, MD sent at 05/12/2021  7:03 AM EDT ----- Regarding: call son Can tell son the blood test for cancer is elevated at 22 (normal up to 5) so also confirms cancer Send copy to [place] and Dr. Burnett Sheng  We need a copy of her PFT's from Dr. Alcide Clever

## 2021-05-15 ENCOUNTER — Telehealth: Payer: Self-pay

## 2021-05-15 NOTE — Telephone Encounter (Signed)
-----   Message from Derwood Kaplan, MD sent at 05/12/2021  7:03 AM EDT ----- Regarding: call son Can tell son the blood test for cancer is elevated at 22 (normal up to 5) so also confirms cancer Send copy to [place] and Dr. Burnett Sheng  We need a copy of her PFT's from Dr. Alcide Clever

## 2021-05-15 NOTE — Telephone Encounter (Signed)
Patient son notified. Also they go to [facility] on Monday 05/19/21

## 2021-05-17 NOTE — Progress Notes (Signed)
Radiation Oncology         [phone] ________________________________  Initial Outpatient Consultation  Name: Sherry Wang MRN: [id number]  Date: 05/19/2021  DOB: 29-Jul-1941  GX:[id number], Olin Hauser, MD  Derwood Kaplan, MD   REFERRING PHYSICIAN: Derwood Kaplan, MD  DIAGNOSIS: The encounter diagnosis was Malignant neoplasm of lower lobe of left lung [facility]).  PET avid left lower lobe lung nodule   HISTORY OF PRESENT ILLNESS::Sherry Wang is a 80 y.o. female who is accompanied by her son. she is seen as a courtesy of Dr. Hinton Rao for an opinion concerning radiation therapy as part of management for her diagnosed LLL lung nodule. The patients left lower lobe nodule has been gradually increasing and has been followed on imaging for a while now. However, the LLL nodule has been difficult to evaluate in the past due to presence of pleural effusions and fibrosis. Chest CT performed in May of 2022 revealed the spiculated LLL lung nodule to measure 1.7 x 1.3 cm; showing enlargement once again from prior imaging. PET performed on 03/19/21 further demonstrated intense FDG uptake associated with the left lower lobe lung nodule which again increased in size, measuring 2.9 x 1.5 cm, appearing hypermetabolic and suspicious for underlying malignancy.   Accordingly, the patient was referred to Dr. Alcide Clever for bronchoscopy and lymph node biopsies on 04/08/21. Pathology from the procedure revealed no evidence of metastases to the biopsied station 11R, 4R, 7, and 11L mediastinal lymph nodes. Station 7S, and 4R lymph nodes revealed scant bronchial cells in the background of abundant blood; biopsies 11R mediastinal lymph node revealed focal lymphocytes and rare bronchial cells in the background of abundant blood; biopsy of station 11L mediastinal lymph node revealed lymphocytes in the background of abundant blood.    (The patient's case was discussed at Tumor Board held on 04/10/21)   The patient was then  referred to Dr. Hinton Rao on 05/07/21 for further evaluation. During this visit, the patient reported fatigue, generalized weakness, 30 pounds weight loss over the past year, intermittent sternal chest pain, chronic cough productive of yellow sputum, and confusion defined by poor memory. Physical exam performed during evaluation revealed the presence of extensive rales at both lung bases, and abdominal hepatomegaly and splenomegaly. Due to the patients significant pulmonary fibrosis, the patient is not a good candidate for biopsy to confirm pathology. Dr. Hinton Rao recommended the patient to pursue stereotactic radiation since the LLL lesion is solitary and placed a referral to radiation oncology.  She is also not a surgical candidate given her breathing issues.    PREVIOUS RADIATION THERAPY: No  PAST MEDICAL HISTORY:  Past Medical History:  Diagnosis Date   Anemia    Anemia, iron deficiency 11/28/2014   Anxiety    Asthma    AVM (arteriovenous malformation) of small bowel, acquired    Biliary cirrhosis (HCC)    Cirrhosis ([place])    Depression    Dyspnea 02/14/2019   Family history of adverse reaction to anesthesia    sister had hysteria after anesthesia   Fast heart beat    occ   Fibromyalgia    Gallstones    GERD (gastroesophageal reflux disease)    GI bleeding    Headache    History of cataract    History of home oxygen therapy    2 liters at hs only   Hyperlipemia    Junctional bradycardia    Low blood pressure    Non-Q wave non-ST elevation myocardial infarction (NSTEMI) ([place]) 2004  Osteoporosis    Pneumonia    last 2016   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   Pulmonary fibrosis ([place])    Sleep apnea    has had sleep study   Tingling    left foot sing back surgery    Wears glasses    Reading   Wears partial dentures    lower    PAST SURGICAL HISTORY: Past Surgical History:  Procedure Laterality Date   CARDIAC CATHETERIZATION  2004   CATARACT EXTRACTION,  BILATERAL     CHOLECYSTECTOMY     COLONOSCOPY  11/08/2008   COLONOSCOPY WITH PROPOFOL N/A 02/21/2019   Procedure: COLONOSCOPY WITH PROPOFOL;  Surgeon: Mauri Pole, MD;  Location: [facility];  Service: Endoscopy;  Laterality: N/A;   ESOPHAGOGASTRODUODENOSCOPY (EGD) WITH PROPOFOL N/A 02/02/2017   Procedure: ESOPHAGOGASTRODUODENOSCOPY (EGD) WITH PROPOFOL;  Surgeon: Mauri Pole, MD;  Location: [facility];  Service: Endoscopy;  Laterality: N/A;   ESOPHAGOGASTRODUODENOSCOPY (EGD) WITH PROPOFOL N/A 02/21/2019   Procedure: ESOPHAGOGASTRODUODENOSCOPY (EGD) WITH PROPOFOL;  Surgeon: Mauri Pole, MD;  Location: [facility];  Service: Endoscopy;  Laterality: N/A;   LAMINECTOMY  age 72   SHOULDER SURGERY Left    TOE SURGERY Right    TONSILLECTOMY     as a child   WRIST FRACTURE SURGERY Left     FAMILY HISTORY:  Family History  Problem Relation Age of Onset   Asthma Father    Leukemia Father        died age 23   Diabetes type II Father    Cervical cancer Paternal Grandmother    Alzheimer's disease Mother    Hypertension Mother    Heart disease Maternal Uncle        x2   Colon polyps Paternal Aunt    Stroke Maternal Aunt    Stroke Sister    Sleep apnea Sister    Diabetes type II Sister    Obesity Sister    Hypertension Brother    Colon cancer Neg Hx    Esophageal cancer Neg Hx    Gallbladder disease Neg Hx     SOCIAL HISTORY:  Social History   Tobacco Use   Smoking status: Former    Packs/day: 1.50    Years: 20.00    Pack years: 30.00    Types: Cigarettes    Quit date: 08/18/1983    Years since quitting: 37.7   Smokeless tobacco: Never  Vaping Use   Vaping Use: Never used  Substance Use Topics   Alcohol use: Not Currently    Alcohol/week: 0.0 standard drinks   Drug use: No    ALLERGIES:  Allergies  Allergen Reactions   Alendronate Sodium     Other reaction(s): Unknown   Ativan [Lorazepam] Other (See Comments)    Hallucinations   Oseltamivir      Other reaction(s): Unknown   Paroxetine Hcl Itching    Other reaction(s): Unknown   Tamiflu [Oseltamivir Phosphate] Nausea And Vomiting    "I just get sick"    MEDICATIONS:  Current Outpatient Medications  Medication Sig Dispense Refill   acetaminophen (TYLENOL) 325 MG tablet Take 650 mg by mouth every 6 (six) hours as needed for moderate pain or headache.      albuterol (PROVENTIL HFA;VENTOLIN HFA) 108 (90 Base) MCG/ACT inhaler Inhale 2 puffs into the lungs every 6 (six) hours as needed for wheezing or shortness of breath.      azithromycin (ZITHROMAX) 500 MG tablet Take 500 mg by mouth daily.  budesonide-formoterol (SYMBICORT) 160-4.5 MCG/ACT inhaler Inhale 2 puffs into the lungs 2 (two) times daily.      calcium carbonate (TUMS - DOSED IN MG ELEMENTAL CALCIUM) 500 MG chewable tablet Chew 2 tablets by mouth daily as needed for indigestion or heartburn.      Cholecalciferol 50 MCG (2000 UT) TABS Take 1 tablet by mouth daily.     cyanocobalamin 1000 MCG tablet Take 1,000 mcg by mouth every evening.     Ferrous Gluconate-C-Folic Acid (IRON-C PO) Take by mouth daily.     fluticasone (FLONASE) 50 MCG/ACT nasal spray Place 2 sprays into both nostrils every evening.      gabapentin (NEURONTIN) 300 MG capsule Take 300 mg by mouth at bedtime.     melatonin 5 MG TABS 1 tablet in the evening     Nintedanib (OFEV) 150 MG CAPS Take 150 mg by mouth daily.     OXYGEN Inhale into the lungs at bedtime. 2 liters at bedtime     sertraline (ZOLOFT) 100 MG tablet Take 200 mg by mouth at bedtime.      diclofenac Sodium (VOLTAREN) 1 % GEL 4 (four) times daily as needed. (Patient not taking: Reported on 05/19/2021)     nitroGLYCERIN (NITROSTAT) 0.4 MG SL tablet Place 0.4 mg under the tongue every 5 (five) minutes as needed for chest pain.  (Patient not taking: Reported on 05/19/2021)     rosuvastatin (CRESTOR) 20 MG tablet Take 20 mg by mouth at bedtime.  (Patient not taking: Reported on 05/19/2021)  2    No current facility-administered medications for this encounter.    REVIEW OF SYSTEMS:  A 10+ POINT REVIEW OF SYSTEMS WAS OBTAINED including neurology, dermatology, psychiatry, cardiac, respiratory, lymph, extremities, GI, GU, musculoskeletal, constitutional, reproductive, HEENT.  She denies any pain within the chest significant cough or hemoptysis.  sHe has supplemental oxygen in place and often uses 2 L.  She ambulates by herself at home but does have a walker when she feels unstable.   PHYSICAL EXAM:  height is 5\' 2"  (1.575 m) and weight is 134 lb 12.8 oz (61.1 kg). Her temporal temperature is 97.5 F (36.4 C) (abnormal). Her blood pressure is 122/77 and her pulse is 58 (abnormal). Her respiration is 16 and oxygen saturation is 99%.   General: Alert and oriented, in no acute distress, remains in wheelchair for the evaluation HEENT: Head is normocephalic. Extraocular movements are intact.  Neck: Neck is supple, no palpable cervical or supraclavicular lymphadenopathy. Heart: Regular in rate and rhythm with no murmurs, rubs, or gallops. Chest: Clear to auscultation bilaterally, with no rhonchi, wheezes, or rales. Abdomen: Soft, nontender, nondistended, with no rigidity or guarding. Extremities: No cyanosis or edema. Lymphatics: see Neck Exam Skin: No concerning lesions. Musculoskeletal: symmetric strength and muscle tone throughout. Neurologic: Cranial nerves II through XII are grossly intact. No obvious focalities. Speech is fluent. Coordination is intact. Psychiatric: Judgment and insight are intact. Affect is appropriate.   ECOG = 1  0 - Asymptomatic (Fully active, able to carry on all predisease activities without restriction)  1 - Symptomatic but completely ambulatory (Restricted in physically strenuous activity but ambulatory and able to carry out work of a light or sedentary nature. For example, light housework, office work)  2 - Symptomatic, <50% in bed during the day  (Ambulatory and capable of all self care but unable to carry out any work activities. Up and about more than 50% of waking hours)  3 - Symptomatic, >50% in bed,  but not bedbound (Capable of only limited self-care, confined to bed or chair 50% or more of waking hours)  4 - Bedbound (Completely disabled. Cannot carry on any self-care. Totally confined to bed or chair)  5 - Death   Eustace Pen MM, Creech RH, Tormey DC, et al. [phone]). "Toxicity and response criteria of the [facility] Group". [place] Oncol. 5 (6): 649-55  LABORATORY DATA:  Lab Results  Component Value Date   WBC 7.1 05/07/2021   HGB 13.2 05/07/2021   HCT 40 05/07/2021   MCV 85 05/07/2021   PLT 221 05/07/2021   NEUTROABS 4.54 05/07/2021   Lab Results  Component Value Date   NA 138 05/07/2021   K 4.0 05/07/2021   CL 110 (A) 05/07/2021   CO2 18 05/07/2021   GLUCOSE 82 04/20/2018   CREATININE 1.0 05/07/2021   CALCIUM 10.6 05/07/2021      RADIOGRAPHY: No results found.    IMPRESSION: PET avid left lower lobe lung nodule   I reviewed the patient's clinical presentation with her and her son.  We discussed that she has a clinical stage I, likely non-small cell lung cancer.  We discussed potential navigational bronchoscopy to obtain tissue from the pulmonary nodule but unsure if this would even be a possibility for the patient.  As above it is too risky to consider CT-guided biopsy of this lesion and she is certainly not a good surgical candidate with her pulmonary fibrosis.  We discussed in detail  the treatment of this lesion with stereotactic body radiation therapy.  We discussed the side effects during treatment potential toxicities and expected results with his therapy.  After careful consideration patient would like to proceed with radiation therapy has discussed without tissue confirmation.  A patient consent form was discussed and signed.  We retained a copy for our records.    PLAN: CT  simulation next week with treatments to begin approximately a week after planning appointment.  anticipate between 3 and 5 SBRT treatments directed at the PET avid solitary left lower lobe pulmonary nodule.   60 minutes of total time was spent for this patient encounter, including preparation, face-to-face counseling with the patient and coordination of care, physical exam, and documentation of the encounter.   ------------------------------------------------  Blair Promise, PhD, MD  This document serves as a record of services personally performed by Gery Pray, MD. It was created on his behalf by Roney Mans, a trained medical scribe. The creation of this record is based on the scribe's personal observations and the provider's statements to them. This document has been checked and approved by the attending provider.

## 2021-05-19 ENCOUNTER — Ambulatory Visit
Admission: RE | Admit: 2021-05-19 | Discharge: 2021-05-19 | Disposition: A | Discharge status: Home / Self Care | Payer: Medicare Other | Source: Ambulatory Visit | Attending: Radiation Oncology | Admitting: Radiation Oncology

## 2021-05-19 ENCOUNTER — Encounter: Payer: Self-pay | Admitting: Radiation Oncology

## 2021-05-19 ENCOUNTER — Other Ambulatory Visit: Payer: Self-pay

## 2021-05-19 VITALS — BP 122/77 | HR 58 | Temp 97.5°F | Resp 16 | Ht 62.0 in | Wt 134.8 lb

## 2021-05-19 DIAGNOSIS — M810 Age-related osteoporosis without current pathological fracture: Secondary | ICD-10-CM | POA: Diagnosis not present

## 2021-05-19 DIAGNOSIS — J90 Pleural effusion, not elsewhere classified: Secondary | ICD-10-CM | POA: Insufficient documentation

## 2021-05-19 DIAGNOSIS — R634 Abnormal weight loss: Secondary | ICD-10-CM | POA: Insufficient documentation

## 2021-05-19 DIAGNOSIS — C3432 Malignant neoplasm of lower lobe, left bronchus or lung: Secondary | ICD-10-CM | POA: Insufficient documentation

## 2021-05-19 DIAGNOSIS — G4730 Sleep apnea, unspecified: Secondary | ICD-10-CM | POA: Insufficient documentation

## 2021-05-19 DIAGNOSIS — K219 Gastro-esophageal reflux disease without esophagitis: Secondary | ICD-10-CM | POA: Insufficient documentation

## 2021-05-19 DIAGNOSIS — D509 Iron deficiency anemia, unspecified: Secondary | ICD-10-CM | POA: Insufficient documentation

## 2021-05-19 DIAGNOSIS — M797 Fibromyalgia: Secondary | ICD-10-CM | POA: Insufficient documentation

## 2021-05-19 DIAGNOSIS — Z87891 Personal history of nicotine dependence: Secondary | ICD-10-CM | POA: Insufficient documentation

## 2021-05-19 DIAGNOSIS — E785 Hyperlipidemia, unspecified: Secondary | ICD-10-CM | POA: Insufficient documentation

## 2021-05-19 DIAGNOSIS — K743 Primary biliary cirrhosis: Secondary | ICD-10-CM | POA: Insufficient documentation

## 2021-05-19 DIAGNOSIS — Q2739 Arteriovenous malformation, other site: Secondary | ICD-10-CM | POA: Insufficient documentation

## 2021-05-19 DIAGNOSIS — I252 Old myocardial infarction: Secondary | ICD-10-CM | POA: Diagnosis not present

## 2021-05-19 DIAGNOSIS — J8410 Pulmonary fibrosis, unspecified: Secondary | ICD-10-CM | POA: Insufficient documentation

## 2021-05-19 DIAGNOSIS — R160 Hepatomegaly, not elsewhere classified: Secondary | ICD-10-CM | POA: Diagnosis not present

## 2021-05-19 DIAGNOSIS — F419 Anxiety disorder, unspecified: Secondary | ICD-10-CM | POA: Diagnosis not present

## 2021-05-19 DIAGNOSIS — Z806 Family history of leukemia: Secondary | ICD-10-CM | POA: Insufficient documentation

## 2021-05-19 DIAGNOSIS — R161 Splenomegaly, not elsewhere classified: Secondary | ICD-10-CM | POA: Insufficient documentation

## 2021-05-19 DIAGNOSIS — Z79899 Other long term (current) drug therapy: Secondary | ICD-10-CM | POA: Insufficient documentation

## 2021-05-19 NOTE — Progress Notes (Signed)
See MD note for nursing evaluation. °

## 2021-05-20 ENCOUNTER — Encounter: Payer: Self-pay | Admitting: *Deleted

## 2021-05-20 NOTE — Progress Notes (Signed)
[place] Work  Initial Assessment   Sherry Wang is a 80 y.o. year old female contacted by phone. Clinical Social Work was referred by distress screen protocol for assessment of psychosocial needs.   SDOH (Social Determinants of Health) assessments performed: Yes SDOH Interventions    Flowsheet Row Most Recent Value  SDOH Interventions   Food Insecurity Interventions Intervention Not Indicated  Financial Strain Interventions Intervention Not Indicated  Housing Interventions Intervention Not Indicated  Transportation Interventions Intervention Not Indicated       Distress Screen completed: Yes ONCBCN DISTRESS SCREENING 05/19/2021  Screening Type Initial Screening  Distress experienced in past week (1-10) 7  Family Problem type Children  Emotional problem type Boredom  Information Concerns Type Lack of info about diagnosis;Lack of info about treatment  Physical Problem type Nausea/vomiting;Getting around;Breathing;Loss of appetitie;Constipation/diarrhea  Referral to clinical social work Yes      Family/Social Information:  Housing Arrangement: patient lives with her son  and grandchildren. Family members/support persons in your life? Family and [employer] concerns: no  Employment: Retired. Income source: Paediatric nurse concerns: No Type of concern: None Food access concerns: no Religious or spiritual practice: no Medication Concerns: no   Coping/ Adjustment to diagnosis: Patient understands treatment plan and what happens next? yes Concerns about diagnosis and/or treatment: I'm not especially worried about anything Patient reported stressors: Children Patient enjoys  Current coping skills/ strengths: Ability for insight  and Supportive family/friends     SUMMARY: Current SDOH Barriers:  None identified at this time   Interventions: Discussed common feeling and emotions when being diagnosed with cancer, and the importance  of support during treatment Informed patient of the support team roles and support services at [facility] Provided Friend contact information and encouraged patient to call with any questions or concerns   Follow Up Plan: Patient will contact CSW with any support or resource needs Patient verbalizes understanding of plan: Yes   Rosary Lively, BSW Intern Supervised by: Kennith Center , LCSW

## 2021-05-27 ENCOUNTER — Ambulatory Visit: Payer: Medicare Other | Admitting: Radiation Oncology

## 2021-05-29 ENCOUNTER — Other Ambulatory Visit: Payer: Self-pay

## 2021-05-29 ENCOUNTER — Ambulatory Visit
Admission: RE | Admit: 2021-05-29 | Discharge: 2021-05-29 | Disposition: A | Discharge status: Home / Self Care | Payer: Medicare Other | Source: Ambulatory Visit | Attending: Radiation Oncology | Admitting: Radiation Oncology

## 2021-05-29 DIAGNOSIS — Z87891 Personal history of nicotine dependence: Secondary | ICD-10-CM | POA: Diagnosis not present

## 2021-05-29 DIAGNOSIS — C3432 Malignant neoplasm of lower lobe, left bronchus or lung: Secondary | ICD-10-CM | POA: Diagnosis not present

## 2021-05-29 DIAGNOSIS — R918 Other nonspecific abnormal finding of lung field: Secondary | ICD-10-CM | POA: Insufficient documentation

## 2021-05-29 DIAGNOSIS — Z510 Encounter for antineoplastic radiation therapy: Secondary | ICD-10-CM | POA: Insufficient documentation

## 2021-06-09 ENCOUNTER — Ambulatory Visit: Payer: Medicare Other | Admitting: Radiation Oncology

## 2021-06-09 DIAGNOSIS — Z87891 Personal history of nicotine dependence: Secondary | ICD-10-CM | POA: Diagnosis not present

## 2021-06-09 DIAGNOSIS — R918 Other nonspecific abnormal finding of lung field: Secondary | ICD-10-CM | POA: Diagnosis not present

## 2021-06-09 DIAGNOSIS — C3432 Malignant neoplasm of lower lobe, left bronchus or lung: Secondary | ICD-10-CM | POA: Diagnosis not present

## 2021-06-09 DIAGNOSIS — Z510 Encounter for antineoplastic radiation therapy: Secondary | ICD-10-CM | POA: Diagnosis not present

## 2021-06-10 ENCOUNTER — Other Ambulatory Visit: Payer: Self-pay

## 2021-06-10 ENCOUNTER — Ambulatory Visit
Admission: RE | Admit: 2021-06-10 | Discharge: 2021-06-10 | Disposition: A | Discharge status: Home / Self Care | Payer: Medicare Other | Source: Ambulatory Visit | Attending: Radiation Oncology | Admitting: Radiation Oncology

## 2021-06-10 DIAGNOSIS — R918 Other nonspecific abnormal finding of lung field: Secondary | ICD-10-CM | POA: Diagnosis not present

## 2021-06-10 DIAGNOSIS — C3432 Malignant neoplasm of lower lobe, left bronchus or lung: Secondary | ICD-10-CM

## 2021-06-10 DIAGNOSIS — Z510 Encounter for antineoplastic radiation therapy: Secondary | ICD-10-CM | POA: Diagnosis not present

## 2021-06-11 ENCOUNTER — Ambulatory Visit: Payer: Medicare Other | Admitting: Radiation Oncology

## 2021-06-12 ENCOUNTER — Ambulatory Visit
Admission: RE | Admit: 2021-06-12 | Discharge: 2021-06-12 | Disposition: A | Discharge status: Home / Self Care | Payer: Medicare Other | Source: Ambulatory Visit | Attending: Radiation Oncology | Admitting: Radiation Oncology

## 2021-06-12 DIAGNOSIS — Z510 Encounter for antineoplastic radiation therapy: Secondary | ICD-10-CM | POA: Diagnosis not present

## 2021-06-12 DIAGNOSIS — R918 Other nonspecific abnormal finding of lung field: Secondary | ICD-10-CM | POA: Diagnosis not present

## 2021-06-12 DIAGNOSIS — C3432 Malignant neoplasm of lower lobe, left bronchus or lung: Secondary | ICD-10-CM

## 2021-06-16 ENCOUNTER — Ambulatory Visit
Admission: RE | Admit: 2021-06-16 | Discharge: 2021-06-16 | Disposition: A | Discharge status: Home / Self Care | Payer: Medicare Other | Source: Ambulatory Visit | Attending: Radiation Oncology | Admitting: Radiation Oncology

## 2021-06-16 ENCOUNTER — Other Ambulatory Visit: Payer: Self-pay

## 2021-06-16 DIAGNOSIS — Z510 Encounter for antineoplastic radiation therapy: Secondary | ICD-10-CM | POA: Diagnosis not present

## 2021-06-16 DIAGNOSIS — C3432 Malignant neoplasm of lower lobe, left bronchus or lung: Secondary | ICD-10-CM

## 2021-06-16 DIAGNOSIS — R918 Other nonspecific abnormal finding of lung field: Secondary | ICD-10-CM | POA: Diagnosis not present

## 2021-06-17 ENCOUNTER — Ambulatory Visit: Payer: Medicare Other | Admitting: Radiation Oncology

## 2021-06-18 ENCOUNTER — Other Ambulatory Visit: Payer: Self-pay

## 2021-06-18 ENCOUNTER — Ambulatory Visit
Admission: RE | Admit: 2021-06-18 | Discharge: 2021-06-18 | Disposition: A | Discharge status: Home / Self Care | Payer: Medicare Other | Source: Ambulatory Visit | Attending: Radiation Oncology | Admitting: Radiation Oncology

## 2021-06-18 DIAGNOSIS — R911 Solitary pulmonary nodule: Secondary | ICD-10-CM | POA: Diagnosis not present

## 2021-06-20 ENCOUNTER — Encounter: Payer: Self-pay | Admitting: Radiation Oncology

## 2021-06-20 ENCOUNTER — Ambulatory Visit
Admission: RE | Admit: 2021-06-20 | Discharge: 2021-06-20 | Disposition: A | Discharge status: Home / Self Care | Payer: Medicare Other | Source: Ambulatory Visit | Attending: Radiation Oncology | Admitting: Radiation Oncology

## 2021-06-20 ENCOUNTER — Other Ambulatory Visit: Payer: Self-pay

## 2021-06-20 DIAGNOSIS — R911 Solitary pulmonary nodule: Secondary | ICD-10-CM | POA: Diagnosis not present

## 2021-06-20 DIAGNOSIS — Z87891 Personal history of nicotine dependence: Secondary | ICD-10-CM | POA: Diagnosis not present

## 2021-06-20 DIAGNOSIS — C3432 Malignant neoplasm of lower lobe, left bronchus or lung: Secondary | ICD-10-CM | POA: Diagnosis not present

## 2021-06-25 ENCOUNTER — Telehealth: Payer: Self-pay | Admitting: *Deleted

## 2021-06-25 NOTE — Telephone Encounter (Signed)
Per 11/9 Staff Msg, patient rescheduled to morning Appt.  Spoke w/son - Mailing Appt Calendar

## 2021-07-03 DIAGNOSIS — R911 Solitary pulmonary nodule: Secondary | ICD-10-CM | POA: Diagnosis not present

## 2021-07-03 DIAGNOSIS — J441 Chronic obstructive pulmonary disease with (acute) exacerbation: Secondary | ICD-10-CM | POA: Diagnosis not present

## 2021-07-03 DIAGNOSIS — I950 Idiopathic hypotension: Secondary | ICD-10-CM | POA: Diagnosis not present

## 2021-07-04 ENCOUNTER — Encounter: Payer: Self-pay | Admitting: Radiology

## 2021-07-06 DIAGNOSIS — Y998 Other external cause status: Secondary | ICD-10-CM | POA: Diagnosis not present

## 2021-07-06 DIAGNOSIS — R778 Other specified abnormalities of plasma proteins: Secondary | ICD-10-CM | POA: Diagnosis not present

## 2021-07-06 DIAGNOSIS — R2689 Other abnormalities of gait and mobility: Secondary | ICD-10-CM | POA: Diagnosis not present

## 2021-07-06 DIAGNOSIS — I452 Bifascicular block: Secondary | ICD-10-CM | POA: Diagnosis not present

## 2021-07-06 DIAGNOSIS — S2231XA Fracture of one rib, right side, initial encounter for closed fracture: Secondary | ICD-10-CM | POA: Diagnosis not present

## 2021-07-06 DIAGNOSIS — M4854XA Collapsed vertebra, not elsewhere classified, thoracic region, initial encounter for fracture: Secondary | ICD-10-CM | POA: Diagnosis not present

## 2021-07-06 DIAGNOSIS — E279 Disorder of adrenal gland, unspecified: Secondary | ICD-10-CM | POA: Diagnosis not present

## 2021-07-06 DIAGNOSIS — R160 Hepatomegaly, not elsewhere classified: Secondary | ICD-10-CM | POA: Diagnosis not present

## 2021-07-06 DIAGNOSIS — I700 Atherosclerosis of aorta: Secondary | ICD-10-CM | POA: Diagnosis not present

## 2021-07-06 DIAGNOSIS — R109 Unspecified abdominal pain: Secondary | ICD-10-CM | POA: Diagnosis not present

## 2021-07-06 DIAGNOSIS — R079 Chest pain, unspecified: Secondary | ICD-10-CM | POA: Diagnosis not present

## 2021-07-06 DIAGNOSIS — C3432 Malignant neoplasm of lower lobe, left bronchus or lung: Secondary | ICD-10-CM | POA: Diagnosis not present

## 2021-07-06 DIAGNOSIS — E860 Dehydration: Secondary | ICD-10-CM | POA: Diagnosis not present

## 2021-07-06 DIAGNOSIS — X58XXXA Exposure to other specified factors, initial encounter: Secondary | ICD-10-CM | POA: Diagnosis not present

## 2021-07-06 DIAGNOSIS — Z87891 Personal history of nicotine dependence: Secondary | ICD-10-CM | POA: Diagnosis not present

## 2021-07-06 DIAGNOSIS — I517 Cardiomegaly: Secondary | ICD-10-CM | POA: Diagnosis not present

## 2021-07-06 DIAGNOSIS — I498 Other specified cardiac arrhythmias: Secondary | ICD-10-CM | POA: Diagnosis not present

## 2021-07-06 DIAGNOSIS — R911 Solitary pulmonary nodule: Secondary | ICD-10-CM | POA: Diagnosis not present

## 2021-07-06 DIAGNOSIS — J8410 Pulmonary fibrosis, unspecified: Secondary | ICD-10-CM | POA: Diagnosis not present

## 2021-07-06 DIAGNOSIS — Z79899 Other long term (current) drug therapy: Secondary | ICD-10-CM | POA: Diagnosis not present

## 2021-07-06 DIAGNOSIS — R0602 Shortness of breath: Secondary | ICD-10-CM | POA: Diagnosis not present

## 2021-07-07 ENCOUNTER — Other Ambulatory Visit: Payer: Self-pay | Admitting: Oncology

## 2021-07-07 ENCOUNTER — Telehealth: Payer: Self-pay | Admitting: Oncology

## 2021-07-07 ENCOUNTER — Encounter: Payer: Self-pay | Admitting: Oncology

## 2021-07-07 DIAGNOSIS — Z859 Personal history of malignant neoplasm, unspecified: Secondary | ICD-10-CM | POA: Insufficient documentation

## 2021-07-07 DIAGNOSIS — S22068A Other fracture of T7-T8 thoracic vertebra, initial encounter for closed fracture: Secondary | ICD-10-CM | POA: Diagnosis not present

## 2021-07-07 DIAGNOSIS — I4510 Unspecified right bundle-branch block: Secondary | ICD-10-CM | POA: Diagnosis not present

## 2021-07-07 DIAGNOSIS — E278 Other specified disorders of adrenal gland: Secondary | ICD-10-CM | POA: Insufficient documentation

## 2021-07-07 DIAGNOSIS — I452 Bifascicular block: Secondary | ICD-10-CM | POA: Diagnosis not present

## 2021-07-07 DIAGNOSIS — R778 Other specified abnormalities of plasma proteins: Secondary | ICD-10-CM | POA: Diagnosis not present

## 2021-07-07 DIAGNOSIS — K769 Liver disease, unspecified: Secondary | ICD-10-CM

## 2021-07-07 DIAGNOSIS — I444 Left anterior fascicular block: Secondary | ICD-10-CM | POA: Diagnosis not present

## 2021-07-07 DIAGNOSIS — C3490 Malignant neoplasm of unspecified part of unspecified bronchus or lung: Secondary | ICD-10-CM

## 2021-07-07 DIAGNOSIS — J8410 Pulmonary fibrosis, unspecified: Secondary | ICD-10-CM | POA: Diagnosis not present

## 2021-07-07 DIAGNOSIS — I498 Other specified cardiac arrhythmias: Secondary | ICD-10-CM | POA: Diagnosis not present

## 2021-07-07 DIAGNOSIS — M545 Low back pain, unspecified: Secondary | ICD-10-CM | POA: Diagnosis not present

## 2021-07-07 DIAGNOSIS — C3432 Malignant neoplasm of lower lobe, left bronchus or lung: Secondary | ICD-10-CM

## 2021-07-08 ENCOUNTER — Telehealth: Payer: Self-pay | Admitting: Oncology

## 2021-07-08 DIAGNOSIS — S22069D Unspecified fracture of T7-T8 vertebra, subsequent encounter for fracture with routine healing: Secondary | ICD-10-CM | POA: Diagnosis not present

## 2021-07-08 DIAGNOSIS — I2510 Atherosclerotic heart disease of native coronary artery without angina pectoris: Secondary | ICD-10-CM | POA: Diagnosis not present

## 2021-07-08 DIAGNOSIS — Z87891 Personal history of nicotine dependence: Secondary | ICD-10-CM | POA: Diagnosis not present

## 2021-07-08 DIAGNOSIS — J84112 Idiopathic pulmonary fibrosis: Secondary | ICD-10-CM | POA: Diagnosis not present

## 2021-07-08 DIAGNOSIS — S2231XD Fracture of one rib, right side, subsequent encounter for fracture with routine healing: Secondary | ICD-10-CM | POA: Diagnosis not present

## 2021-07-08 DIAGNOSIS — C7970 Secondary malignant neoplasm of unspecified adrenal gland: Secondary | ICD-10-CM | POA: Diagnosis not present

## 2021-07-08 DIAGNOSIS — C3432 Malignant neoplasm of lower lobe, left bronchus or lung: Secondary | ICD-10-CM | POA: Diagnosis not present

## 2021-07-08 DIAGNOSIS — Z9981 Dependence on supplemental oxygen: Secondary | ICD-10-CM | POA: Diagnosis not present

## 2021-07-08 DIAGNOSIS — Z7951 Long term (current) use of inhaled steroids: Secondary | ICD-10-CM | POA: Diagnosis not present

## 2021-07-08 DIAGNOSIS — K219 Gastro-esophageal reflux disease without esophagitis: Secondary | ICD-10-CM | POA: Diagnosis not present

## 2021-07-08 DIAGNOSIS — J9610 Chronic respiratory failure, unspecified whether with hypoxia or hypercapnia: Secondary | ICD-10-CM | POA: Diagnosis not present

## 2021-07-08 DIAGNOSIS — E785 Hyperlipidemia, unspecified: Secondary | ICD-10-CM | POA: Diagnosis not present

## 2021-07-08 NOTE — Telephone Encounter (Signed)
07/08/21 Spoke with patients son and scheduled all appts.

## 2021-07-11 DIAGNOSIS — E860 Dehydration: Secondary | ICD-10-CM | POA: Diagnosis present

## 2021-07-11 DIAGNOSIS — I10 Essential (primary) hypertension: Secondary | ICD-10-CM | POA: Diagnosis present

## 2021-07-11 DIAGNOSIS — C7971 Secondary malignant neoplasm of right adrenal gland: Secondary | ICD-10-CM | POA: Diagnosis not present

## 2021-07-11 DIAGNOSIS — I252 Old myocardial infarction: Secondary | ICD-10-CM | POA: Diagnosis not present

## 2021-07-11 DIAGNOSIS — I4891 Unspecified atrial fibrillation: Secondary | ICD-10-CM | POA: Diagnosis present

## 2021-07-11 DIAGNOSIS — D649 Anemia, unspecified: Secondary | ICD-10-CM | POA: Diagnosis present

## 2021-07-11 DIAGNOSIS — R059 Cough, unspecified: Secondary | ICD-10-CM | POA: Diagnosis not present

## 2021-07-11 DIAGNOSIS — R112 Nausea with vomiting, unspecified: Secondary | ICD-10-CM | POA: Diagnosis not present

## 2021-07-11 DIAGNOSIS — C7951 Secondary malignant neoplasm of bone: Secondary | ICD-10-CM | POA: Diagnosis present

## 2021-07-11 DIAGNOSIS — I517 Cardiomegaly: Secondary | ICD-10-CM | POA: Diagnosis not present

## 2021-07-11 DIAGNOSIS — R531 Weakness: Secondary | ICD-10-CM | POA: Diagnosis not present

## 2021-07-11 DIAGNOSIS — R0600 Dyspnea, unspecified: Secondary | ICD-10-CM | POA: Diagnosis not present

## 2021-07-11 DIAGNOSIS — C7972 Secondary malignant neoplasm of left adrenal gland: Secondary | ICD-10-CM | POA: Diagnosis not present

## 2021-07-11 DIAGNOSIS — C3490 Malignant neoplasm of unspecified part of unspecified bronchus or lung: Secondary | ICD-10-CM | POA: Diagnosis not present

## 2021-07-11 DIAGNOSIS — Z87891 Personal history of nicotine dependence: Secondary | ICD-10-CM | POA: Diagnosis not present

## 2021-07-11 DIAGNOSIS — M1990 Unspecified osteoarthritis, unspecified site: Secondary | ICD-10-CM | POA: Diagnosis present

## 2021-07-11 DIAGNOSIS — R52 Pain, unspecified: Secondary | ICD-10-CM | POA: Diagnosis not present

## 2021-07-11 DIAGNOSIS — R630 Anorexia: Secondary | ICD-10-CM | POA: Diagnosis not present

## 2021-07-11 DIAGNOSIS — R0902 Hypoxemia: Secondary | ICD-10-CM | POA: Diagnosis not present

## 2021-07-11 DIAGNOSIS — F419 Anxiety disorder, unspecified: Secondary | ICD-10-CM | POA: Diagnosis present

## 2021-07-11 DIAGNOSIS — C787 Secondary malignant neoplasm of liver and intrahepatic bile duct: Secondary | ICD-10-CM | POA: Diagnosis not present

## 2021-07-11 DIAGNOSIS — E43 Unspecified severe protein-calorie malnutrition: Secondary | ICD-10-CM | POA: Diagnosis not present

## 2021-07-11 DIAGNOSIS — R279 Unspecified lack of coordination: Secondary | ICD-10-CM | POA: Diagnosis not present

## 2021-07-11 DIAGNOSIS — J449 Chronic obstructive pulmonary disease, unspecified: Secondary | ICD-10-CM | POA: Diagnosis present

## 2021-07-11 DIAGNOSIS — R110 Nausea: Secondary | ICD-10-CM | POA: Diagnosis not present

## 2021-07-11 DIAGNOSIS — K219 Gastro-esophageal reflux disease without esophagitis: Secondary | ICD-10-CM | POA: Diagnosis present

## 2021-07-11 DIAGNOSIS — R5383 Other fatigue: Secondary | ICD-10-CM | POA: Diagnosis not present

## 2021-07-11 DIAGNOSIS — S22060D Wedge compression fracture of T7-T8 vertebra, subsequent encounter for fracture with routine healing: Secondary | ICD-10-CM | POA: Diagnosis not present

## 2021-07-11 DIAGNOSIS — F32A Depression, unspecified: Secondary | ICD-10-CM | POA: Diagnosis present

## 2021-07-11 DIAGNOSIS — Z7401 Bed confinement status: Secondary | ICD-10-CM | POA: Diagnosis not present

## 2021-07-11 DIAGNOSIS — J8410 Pulmonary fibrosis, unspecified: Secondary | ICD-10-CM | POA: Diagnosis present

## 2021-07-11 DIAGNOSIS — E7800 Pure hypercholesterolemia, unspecified: Secondary | ICD-10-CM | POA: Diagnosis present

## 2021-07-11 DIAGNOSIS — R5381 Other malaise: Secondary | ICD-10-CM | POA: Diagnosis not present

## 2021-07-11 DIAGNOSIS — S2231XA Fracture of one rib, right side, initial encounter for closed fracture: Secondary | ICD-10-CM | POA: Diagnosis not present

## 2021-07-11 DIAGNOSIS — Z9981 Dependence on supplemental oxygen: Secondary | ICD-10-CM | POA: Diagnosis not present

## 2021-07-11 DIAGNOSIS — I4510 Unspecified right bundle-branch block: Secondary | ICD-10-CM | POA: Diagnosis not present

## 2021-07-11 DIAGNOSIS — M4854XA Collapsed vertebra, not elsewhere classified, thoracic region, initial encounter for fracture: Secondary | ICD-10-CM | POA: Diagnosis not present

## 2021-07-11 DIAGNOSIS — E785 Hyperlipidemia, unspecified: Secondary | ICD-10-CM | POA: Diagnosis present

## 2021-07-11 DIAGNOSIS — J309 Allergic rhinitis, unspecified: Secondary | ICD-10-CM | POA: Diagnosis not present

## 2021-07-11 DIAGNOSIS — M549 Dorsalgia, unspecified: Secondary | ICD-10-CM | POA: Diagnosis not present

## 2021-07-11 DIAGNOSIS — C3432 Malignant neoplasm of lower lobe, left bronchus or lung: Secondary | ICD-10-CM | POA: Diagnosis not present

## 2021-07-11 DIAGNOSIS — J9611 Chronic respiratory failure with hypoxia: Secondary | ICD-10-CM | POA: Diagnosis present

## 2021-07-11 DIAGNOSIS — C3491 Malignant neoplasm of unspecified part of right bronchus or lung: Secondary | ICD-10-CM | POA: Diagnosis not present

## 2021-07-11 DIAGNOSIS — Z8701 Personal history of pneumonia (recurrent): Secondary | ICD-10-CM | POA: Diagnosis not present

## 2021-07-11 DIAGNOSIS — Z79899 Other long term (current) drug therapy: Secondary | ICD-10-CM | POA: Diagnosis not present

## 2021-07-11 DIAGNOSIS — C7970 Secondary malignant neoplasm of unspecified adrenal gland: Secondary | ICD-10-CM | POA: Diagnosis present

## 2021-07-11 DIAGNOSIS — M8448XA Pathological fracture, other site, initial encounter for fracture: Secondary | ICD-10-CM | POA: Diagnosis not present

## 2021-07-11 DIAGNOSIS — S2231XD Fracture of one rib, right side, subsequent encounter for fracture with routine healing: Secondary | ICD-10-CM | POA: Diagnosis not present

## 2021-07-14 ENCOUNTER — Other Ambulatory Visit: Payer: Medicare Other

## 2021-07-14 ENCOUNTER — Telehealth: Payer: Self-pay | Admitting: Oncology

## 2021-07-14 NOTE — Telephone Encounter (Signed)
Per Levada Dy, Patient Inpatient

## 2021-07-15 NOTE — Progress Notes (Incomplete)
  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   PET avid left lower lobe lung nodule   Intent: Curative  Radiation Treatment Dates: 06/10/2021 through 06/20/2021 Site Technique Total Dose (Gy) Dose per Fx (Gy) Completed Fx Beam Energies  Lung, Left: Lung_Lt IMRT 60/60 12 5/5 6XFFF   Narrative: The patient tolerated radiation therapy relatively well. Patient denies pain, reports having a low energy level. Patient reports having a productive cough, denies shortness of breath or swallowing issues. Skin remains intact.  Plan: The patient will follow-up with radiation oncology in month. Plan for initial chest CT scan 4 months out from radiation therapy.  ________________________________________________ -----------------------------------  Blair Promise, PhD, MD  This document serves as a record of services personally performed by Gery Pray, MD. It was created on his behalf by Roney Mans, a trained medical scribe. The creation of this record is based on the scribe's personal observations and the provider's statements to them. This document has been checked and approved by the attending provider.

## 2021-07-16 NOTE — Progress Notes (Incomplete)
[place] [facility]  [address] [place],  [place]  [address] [phone]  Clinic Day:  07/16/2021  Referring physician: Greig Right, MD  This document serves as a record of services personally performed by Hosie Poisson, MD. It was created on their behalf by [facility] E, a trained medical scribe. The creation of this record is based on the scribe's personal observations and the provider's statements to them.  CHIEF COMPLAINT:  CC: Suspicious lung nodule  Current Treatment:  Still finalizing treatment plan   HISTORY OF PRESENT ILLNESS:  Sherry Wang is a 80 y.o. female referred by Dr. Alcide Clever for the evaluation and treatment of a hypermetabolic lung nodule. This was observed on CT chest from May 2022, which revealed a spiculated appearing nodule in the dependent left lung base measuring 1.7 x 1.3 cm, which has clearly enlarged over sequential prior examinations but has been somewhat difficult to evaluate given the presence of pleural effusions and fibrosis on most recent examinations. PET was pursued on August 3rd and confirmed intense FDG uptake associated with the left lower lobe lung nodule which increased in size, measuring 2.9 x 1.5 cm, suspicious for underlying malignancy. No FDG avid mediastinal or hilar lymph nodes or signs of distant metastatic disease. 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 She was referred to Dr. Alcide Clever for bronchoscopy with EBUS which was pursued on August 23rd and was negative. Fine needle aspiration of mediastinal lymph nodes was obtained and cytopathology confirmed focal lymphocytes and rare bronchial cells in the background of abundant blood. No evidence of metastases.   INTERVAL HISTORY:  Sherry Wang states that she feels poorly with fatigue and generalized weakness. She is severely hypotensive today, and states that she has not had any blood pressure medication  in over a year. She states that she has had hypotension before. She denies dizziness. She has seen cardiology in the past, Dr. Harriet Masson. She does have significant pulmonary fibrosis and is currently on OFEV 150 mg daily. This causes some mild nausea. She reports a 30 pound weight loss over the past year.  She reports occasional mid sternal pain. She has cough with yellow sputum, which is chronic. She denies hemoptysis. She has arthritis. She does report poor memory and her son helps with her history. Blood counts and chemistries are unremarkable except for a calcium of 10.7, and an alkaline phosphatase of 277. She is on vitamin D but not a calcium supplement. 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 Her  appetite is good, and she is eating well. She denies fever, chills or other signs of infection.  She denies vomiting, bowel issues, or abdominal pain.  She denies sore throat or chest pain.  Sherry Wang is here for follow up after recently presenting to the hospital on November 25th due to generalized weakness, nausea, vomiting and severe back pain. I received a call in mid November from a hospitalist in [facility] that patient admitted with fractured ribs, reportedly from coughing, but we were all suspicious of bone metastases.  A scan there revealed a new 2.9 cm liver lesion in the dome of the liver and bilateral adrenal masses. She does have a compression fracture of T8  as well as the 9th right rib.  She has lost a lot of weight, has no appetite and has deteriorated greatly in the last few weeks.  I met with her and her son at the bedside and reviewed  the situation.  We discussed the risks and benefits of approaches and she is in agreement with comfort care and hospice.  I have called in the referral to [place].   Her  appetite is good, and she has gained/lost _ pounds since her last visit.  She denies fever, chills or other signs of infection.  She denies nausea, vomiting, bowel  issues, or abdominal pain.  She denies sore throat, cough, dyspnea, or chest pain.  REVIEW OF SYSTEMS:  Review of Systems  Constitutional:  Positive for fatigue and unexpected weight change (30 pounds within the last year). Negative for appetite change, chills and fever.       Generalized weakness  HENT:  Negative.    Eyes: Negative.   Respiratory:  Positive for cough (productive with yellow sputum) and shortness of breath (with mild exertion, uses home oxygen). Negative for chest tightness, hemoptysis and wheezing.   Cardiovascular:  Positive for chest pain (intermittent, mid sternal). Negative for leg swelling and palpitations.  Gastrointestinal:  Positive for nausea (due to medication). Negative for abdominal distention, abdominal pain, blood in stool, constipation, diarrhea and vomiting.  Endocrine: Negative.   Genitourinary: Negative.  Negative for difficulty urinating, dysuria, frequency and hematuria.   Musculoskeletal:  Positive for arthralgias. Negative for back pain, flank pain, gait problem and myalgias.  Skin: Negative.   Neurological: Negative.  Negative for dizziness, extremity weakness, gait problem, headaches, light-headedness, numbness, seizures and speech difficulty.  Hematological: Negative.   Psychiatric/Behavioral:  Positive for confusion (poor memory). Negative for depression and sleep disturbance. The patient is nervous/anxious.     VITALS:  There were no vitals taken for this visit.  Wt Readings from Last 3 Encounters:  05/19/21 134 lb 12.8 oz (61.1 kg)  05/07/21 141 lb 9.6 oz (64.2 kg)  10/21/20 143 lb 3.2 oz (65 kg)    There is no height or weight on file to calculate BMI.  Performance status (ECOG): 1 - Symptomatic but completely ambulatory  PHYSICAL EXAM:  Physical Exam Constitutional:      General: She is not in acute distress.    Appearance: Normal appearance. She is normal weight.  HENT:     Head: Normocephalic and atraumatic.  Eyes:     General:  No scleral icterus.    Extraocular Movements: Extraocular movements intact.     Conjunctiva/sclera: Conjunctivae normal.     Pupils: Pupils are equal, round, and reactive to light.  Cardiovascular:     Rate and Rhythm: Normal rate and regular rhythm.     Pulses: Normal pulses.     Heart sounds: Normal heart sounds. No murmur heard.   No friction rub. No gallop.  Pulmonary:     Effort: Pulmonary effort is normal. No respiratory distress.     Breath sounds: Normal breath sounds.  Abdominal:     General: Bowel sounds are normal. There is no distension.     Palpations: Abdomen is soft. There is no hepatomegaly, splenomegaly or mass.     Tenderness: There is no abdominal tenderness.  Musculoskeletal:        General: Normal range of motion.     Cervical back: Normal range of motion and neck supple.     Right lower leg: No edema.     Left lower leg: No edema.  Lymphadenopathy:     Cervical: No cervical adenopathy.  Skin:    General: Skin is warm and dry.  Neurological:     General: No focal deficit present.  Mental Status: She is alert and oriented to person, place, and time. Mental status is at baseline.  Psychiatric:        Mood and Affect: Mood normal.        Behavior: Behavior normal.        Thought Content: Thought content normal.        Judgment: Judgment normal.    LABS:   CBC Latest Ref Rng & Units 05/07/2021 09/13/2020 03/01/2018  WBC - 7.1 6.3 7.0  Hemoglobin 12.0 - 16.0 13.2 12.1 13.0  Hematocrit 36 - 46 40 38.9 39.9  Platelets 150 - 399 221 240 236.0   CMP Latest Ref Rng & Units 05/07/2021 04/20/2018 03/01/2018  Glucose 65 - 99 mg/dL - 82 97  BUN 4 - _0 Creatinine 0.5 - 1.1 1.0 0.98 0.99  Sodium 137 - 147 138 142 138  Potassium 3.4 - 5.3 4.0 4.1 4.1  Chloride 99 - 108 110(A) 105 104  CO2 13 - _1 Calcium 8.7 - 10.7 10.6 10.2 11.0(H)  Total Protein 6.0 - 8.3 g/dL - - 7.7  Total Bilirubin 0.2 - 1.2 mg/dL - - 0.4  Alkaline Phos 25 - 125 277(A) -  120(H)  AST 13 - 35 38(A) - 20  ALT 7 - 35 21 - 14     Lab Results  Component Value Date   CEA1 22.3 (H) 05/07/2021   /  CEA  Date Value Ref Range Status  05/07/2021 22.3 (H) 0.0 - 4.7 ng/mL Final    Comment:    (NOTE)                             Nonsmokers          <3.9                             Smokers             <5.6 [employer] Electrochemiluminescence Immunoassay (ECLIA) Values obtained with different assay methods or kits cannot be used interchangeably.  Results cannot be interpreted as absolute evidence of the presence or absence of malignant disease. Performed At: [facility] [place], [place] [id number] [place] MD [id number]     Lab Results  Component Value Date   TIBC 281 09/18/2020   FERRITIN 41 09/18/2020   FERRITIN 10.6 10/05/2018   FERRITIN 11.5 03/01/2018   IRONPCTSAT 14 (L) 09/18/2020   No results found for: LDH  STUDIES:  No results found.    HISTORY:   Allergies:  Allergies  Allergen Reactions   Alendronate Sodium     Other reaction(s): Unknown   Ativan [Lorazepam] Other (See Comments)    Hallucinations   Oseltamivir     Other reaction(s): Unknown   Paroxetine Hcl Itching    Other reaction(s): Unknown   Tamiflu [Oseltamivir Phosphate] Nausea And Vomiting    "I just get sick"    Current Medications: Current Outpatient Medications  Medication Sig Dispense Refill   acetaminophen (TYLENOL) 325 MG tablet Take 650 mg by mouth every 6 (six) hours as needed for moderate pain or headache.      albuterol (PROVENTIL HFA;VENTOLIN HFA) 108 (90 Base) MCG/ACT inhaler Inhale 2 puffs into the lungs every 6 (six) hours as needed for wheezing or shortness of breath.      azithromycin (ZITHROMAX) 500 MG tablet  Take 500 mg by mouth daily.     budesonide-formoterol (SYMBICORT) 160-4.5 MCG/ACT inhaler Inhale 2 puffs into the lungs 2 (two) times daily.      calcium carbonate (TUMS - DOSED IN MG ELEMENTAL CALCIUM)  500 MG chewable tablet Chew 2 tablets by mouth daily as needed for indigestion or heartburn.      Cholecalciferol 50 MCG (2000 UT) TABS Take 1 tablet by mouth daily.     cyanocobalamin 1000 MCG tablet Take 1,000 mcg by mouth every evening.     diclofenac Sodium (VOLTAREN) 1 % GEL 4 (four) times daily as needed. (Patient not taking: Reported on 05/19/2021)     Ferrous Gluconate-C-Folic Acid (IRON-C PO) Take by mouth daily.     fluticasone (FLONASE) 50 MCG/ACT nasal spray Place 2 sprays into both nostrils every evening.      gabapentin (NEURONTIN) 300 MG capsule Take 300 mg by mouth at bedtime.     melatonin 5 MG TABS 1 tablet in the evening     Nintedanib (OFEV) 150 MG CAPS Take 150 mg by mouth daily.     nitroGLYCERIN (NITROSTAT) 0.4 MG SL tablet Place 0.4 mg under the tongue every 5 (five) minutes as needed for chest pain.  (Patient not taking: Reported on 05/19/2021)     OXYGEN Inhale into the lungs at bedtime. 2 liters at bedtime     rosuvastatin (CRESTOR) 20 MG tablet Take 20 mg by mouth at bedtime.  (Patient not taking: Reported on 05/19/2021)  2   sertraline (ZOLOFT) 100 MG tablet Take 200 mg by mouth at bedtime.      No current facility-administered medications for this visit.     ASSESSMENT & PLAN:   Assessment:   Suspicious and enlarging left lower lobe lung nodule now measuring 2.9 x 1.5 cm. 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 Four mediastinal lymph nodes were negative for malignancy, but she is not really a good candidate for surgery due to her respiratory compromise. Unfortunately, the patient has deteriorated, and has been referred to Hospice. We will continue with supportive and comfort care.  Significant pulmonary fibrosis, for the past 5-8 years. She has been on prednisone intermittently, and is currently on OFEV 150 mg daily.  Severe hypotension. The patient states that she has had this before, and has been  off all blood pressure medication for the past year. Her son thinks he may have medicines at home to raise her blood pressure but she has not been symptomatic.  Primary biliary cirrhosis.  Hypercalcemia. This could be related to underlying malignancy. She is not on oral calcium other than TUMS, but she does not take this often.   Plan: As above, the patient has been referred to Hospice. We will continue with supportive and comfort care. She and her son understand and agree with this plan of care. I have answered their questions and they know to call with any concerns.   I provided *** minutes of face-to-face time during this this encounter and > 50% was spent counseling as documented under my assessment and plan.    Derwood Kaplan, MD [facility] AT [facility] [address] [place] [address] Dept: [phone] Dept Fax: [phone]   I, Rita Ohara, am acting as scribe for Derwood Kaplan, MD  I have reviewed this report as typed by the medical scribe, and it is complete and accurate.  Hermina Barters

## 2021-07-16 NOTE — Progress Notes (Incomplete)
Radiation Oncology         ([phone]) [phone] ________________________________  Name: Sherry Wang MRN: [id number]  Date: 07/17/2021  DOB: January 29, 1941  Follow-Up Visit Note  CC: Greig Right, MD  Derwood Kaplan, MD  No diagnosis found.  Diagnosis: The encounter diagnosis was Malignant neoplasm of lower lobe of left lung ([place]).   PET avid left lower lobe lung nodule   Interval Since Last Radiation: 26 days   Intent: Curative  Radiation Treatment Dates: 06/10/2021 through 06/20/2021 Site Technique Total Dose (Gy) Dose per Fx (Gy) Completed Fx Beam Energies  Lung, Left: Lung_Lt IMRT 60/60 12 5/5 [id number]    Narrative:  The patient returns today for routine follow-up. The patient tolerated radiation therapy relatively well. She denied pain, though reported having a low energy level. Patient also reported having a productive cough.   Since completing treatment, the patient presented to the [facility] ED on 07/06/21 with acute back pain. CT of the abdomen and pelvis/ CTA performed during hospital course revealed a stable chronic T8 mild compression fracture. A 1 cm left lower lobe nodule previously identified as neoplasm appeared stable / slightly smaller than on prior CTA. Additionally, interval development of a mass in the dome of the liver and bilateral adrenal masses were appreciated, noted as consistent with metastases. No evidence of pneumothorax or pulmonary arterial embolism visualized.    Allergies:  is allergic to alendronate sodium, ativan [lorazepam], oseltamivir, paroxetine hcl, and tamiflu [oseltamivir phosphate].  Meds: Current Outpatient Medications  Medication Sig Dispense Refill   acetaminophen (TYLENOL) 325 MG tablet Take 650 mg by mouth every 6 (six) hours as needed for moderate pain or headache.      albuterol (PROVENTIL HFA;VENTOLIN HFA) 108 (90 Base) MCG/ACT inhaler Inhale 2 puffs into the lungs every 6 (six) hours as needed for wheezing or shortness of breath.       azithromycin (ZITHROMAX) 500 MG tablet Take 500 mg by mouth daily.     budesonide-formoterol (SYMBICORT) 160-4.5 MCG/ACT inhaler Inhale 2 puffs into the lungs 2 (two) times daily.      calcium carbonate (TUMS - DOSED IN MG ELEMENTAL CALCIUM) 500 MG chewable tablet Chew 2 tablets by mouth daily as needed for indigestion or heartburn.      Cholecalciferol 50 MCG (2000 UT) TABS Take 1 tablet by mouth daily.     cyanocobalamin 1000 MCG tablet Take 1,000 mcg by mouth every evening.     diclofenac Sodium (VOLTAREN) 1 % GEL 4 (four) times daily as needed. (Patient not taking: Reported on 05/19/2021)     Ferrous Gluconate-C-Folic Acid (IRON-C PO) Take by mouth daily.     fluticasone (FLONASE) 50 MCG/ACT nasal spray Place 2 sprays into both nostrils every evening.      gabapentin (NEURONTIN) 300 MG capsule Take 300 mg by mouth at bedtime.     melatonin 5 MG TABS 1 tablet in the evening     Nintedanib (OFEV) 150 MG CAPS Take 150 mg by mouth daily.     nitroGLYCERIN (NITROSTAT) 0.4 MG SL tablet Place 0.4 mg under the tongue every 5 (five) minutes as needed for chest pain.  (Patient not taking: Reported on 05/19/2021)     OXYGEN Inhale into the lungs at bedtime. 2 liters at bedtime     rosuvastatin (CRESTOR) 20 MG tablet Take 20 mg by mouth at bedtime.  (Patient not taking: Reported on 05/19/2021)  2   sertraline (ZOLOFT) 100 MG tablet Take 200 mg by mouth at bedtime.  No current facility-administered medications for this encounter.    Physical Findings: The patient is in no acute distress. Patient is alert and oriented.  vitals were not taken for this visit. .  No significant changes. Lungs are clear to auscultation bilaterally. Heart has regular rate and rhythm. No palpable cervical, supraclavicular, or axillary adenopathy. Abdomen soft, non-tender, normal bowel sounds. ***   Lab Findings: Lab Results  Component Value Date   WBC 7.1 05/07/2021   HGB 13.2 05/07/2021   HCT 40 05/07/2021   MCV  85 05/07/2021   PLT 221 05/07/2021    Radiographic Findings: No results found.  Impression: The encounter diagnosis was Malignant neoplasm of lower lobe of left lung ([place]).   PET avid left lower lobe lung nodule   The patient is recovering from the effects of radiation.  ***  Plan:  ***   *** minutes of total time was spent for this patient encounter, including preparation, face-to-face counseling with the patient and coordination of care, physical exam, and documentation of the encounter. ____________________________________  Blair Promise, PhD, MD   This document serves as a record of services personally performed by Gery Pray, MD. It was created on his behalf by Roney Mans, a trained medical scribe. The creation of this record is based on the scribe's personal observations and the provider's statements to them. This document has been checked and approved by the attending provider.

## 2021-07-17 ENCOUNTER — Telehealth: Payer: Self-pay | Admitting: *Deleted

## 2021-07-17 ENCOUNTER — Ambulatory Visit
Admission: RE | Admit: 2021-07-17 | Discharge: 2021-07-17 | Disposition: A | Discharge status: Home / Self Care | Payer: Medicare Other | Source: Ambulatory Visit | Attending: Radiation Oncology | Admitting: Radiation Oncology

## 2021-07-17 DIAGNOSIS — S22060D Wedge compression fracture of T7-T8 vertebra, subsequent encounter for fracture with routine healing: Secondary | ICD-10-CM | POA: Diagnosis not present

## 2021-07-17 DIAGNOSIS — R630 Anorexia: Secondary | ICD-10-CM | POA: Diagnosis not present

## 2021-07-17 DIAGNOSIS — C7972 Secondary malignant neoplasm of left adrenal gland: Secondary | ICD-10-CM | POA: Diagnosis not present

## 2021-07-17 DIAGNOSIS — S2231XD Fracture of one rib, right side, subsequent encounter for fracture with routine healing: Secondary | ICD-10-CM | POA: Diagnosis not present

## 2021-07-17 DIAGNOSIS — J309 Allergic rhinitis, unspecified: Secondary | ICD-10-CM | POA: Diagnosis not present

## 2021-07-17 DIAGNOSIS — I10 Essential (primary) hypertension: Secondary | ICD-10-CM | POA: Diagnosis not present

## 2021-07-17 DIAGNOSIS — E785 Hyperlipidemia, unspecified: Secondary | ICD-10-CM | POA: Diagnosis not present

## 2021-07-17 DIAGNOSIS — F32A Depression, unspecified: Secondary | ICD-10-CM | POA: Diagnosis not present

## 2021-07-17 DIAGNOSIS — C3432 Malignant neoplasm of lower lobe, left bronchus or lung: Secondary | ICD-10-CM | POA: Diagnosis not present

## 2021-07-17 DIAGNOSIS — C7971 Secondary malignant neoplasm of right adrenal gland: Secondary | ICD-10-CM | POA: Diagnosis not present

## 2021-07-17 DIAGNOSIS — Z87891 Personal history of nicotine dependence: Secondary | ICD-10-CM | POA: Diagnosis not present

## 2021-07-17 DIAGNOSIS — C787 Secondary malignant neoplasm of liver and intrahepatic bile duct: Secondary | ICD-10-CM | POA: Diagnosis not present

## 2021-07-17 DIAGNOSIS — Z9981 Dependence on supplemental oxygen: Secondary | ICD-10-CM | POA: Diagnosis not present

## 2021-07-17 DIAGNOSIS — J8410 Pulmonary fibrosis, unspecified: Secondary | ICD-10-CM | POA: Diagnosis not present

## 2021-07-17 DIAGNOSIS — E43 Unspecified severe protein-calorie malnutrition: Secondary | ICD-10-CM | POA: Diagnosis not present

## 2021-07-17 NOTE — Telephone Encounter (Signed)
Called patient to ask about rescheduling missed fu appt. for 07-17-21, spoke with patient's son- Herbie [place] and he asked me to not reschedule this fu appt. due to his mom being in [place] now, informed Marylene Land.

## 2021-07-18 ENCOUNTER — Telehealth: Payer: Self-pay

## 2021-07-18 ENCOUNTER — Other Ambulatory Visit: Payer: Self-pay | Admitting: Oncology

## 2021-07-18 ENCOUNTER — Encounter: Payer: Self-pay | Admitting: Oncology

## 2021-07-18 DIAGNOSIS — J8410 Pulmonary fibrosis, unspecified: Secondary | ICD-10-CM | POA: Diagnosis not present

## 2021-07-18 DIAGNOSIS — C3432 Malignant neoplasm of lower lobe, left bronchus or lung: Secondary | ICD-10-CM | POA: Diagnosis not present

## 2021-07-18 DIAGNOSIS — C787 Secondary malignant neoplasm of liver and intrahepatic bile duct: Secondary | ICD-10-CM | POA: Diagnosis not present

## 2021-07-18 DIAGNOSIS — C7971 Secondary malignant neoplasm of right adrenal gland: Secondary | ICD-10-CM | POA: Diagnosis not present

## 2021-07-18 DIAGNOSIS — C7972 Secondary malignant neoplasm of left adrenal gland: Secondary | ICD-10-CM | POA: Diagnosis not present

## 2021-07-18 DIAGNOSIS — Z9981 Dependence on supplemental oxygen: Secondary | ICD-10-CM | POA: Diagnosis not present

## 2021-07-18 MED ORDER — MORPHINE SULFATE ER 15 MG PO TBCR: 15.0000 mg | EXTENDED_RELEASE_TABLET | Freq: Two times a day (BID) | ORAL | 0 refills | Status: AC

## 2021-07-18 NOTE — Telephone Encounter (Signed)
Kim with Hospice called wanting to know if they can discontinue the following medications:   Crestor  D3  B12  Symbicort  Nasal spray  Nintedinib  Zoloft @ bedtime   Also per patient son her pain is not being controled with oxycodone 5mg  q4hrs.     He has been given her 10mg  at HS.   Kim recommended 15 mg extended release morphine and then to continue with the 5mg  oxycodone for breakthrough.       Derwood Kaplan, MD  Georgette Shell, RN I agree with stopping all of the meds except the Zoloft, I think she needs that.  I agree with MS Contin 15 mg bid and have her use 5-10 mg q 4 hrs prn.  I can send in the MS Contin? Or do they want to do .  Per Maudie Mercury with Hospice, they want Dr .Hinton Rao to send in prescription for MS Contin to [facility] in [place].  Dr. Hinton Rao aware.

## 2021-07-20 DIAGNOSIS — Z9981 Dependence on supplemental oxygen: Secondary | ICD-10-CM | POA: Diagnosis not present

## 2021-07-20 DIAGNOSIS — C3432 Malignant neoplasm of lower lobe, left bronchus or lung: Secondary | ICD-10-CM | POA: Diagnosis not present

## 2021-07-20 DIAGNOSIS — J8410 Pulmonary fibrosis, unspecified: Secondary | ICD-10-CM | POA: Diagnosis not present

## 2021-07-20 DIAGNOSIS — C7971 Secondary malignant neoplasm of right adrenal gland: Secondary | ICD-10-CM | POA: Diagnosis not present

## 2021-07-20 DIAGNOSIS — C787 Secondary malignant neoplasm of liver and intrahepatic bile duct: Secondary | ICD-10-CM | POA: Diagnosis not present

## 2021-07-20 DIAGNOSIS — C7972 Secondary malignant neoplasm of left adrenal gland: Secondary | ICD-10-CM | POA: Diagnosis not present

## 2021-07-21 DIAGNOSIS — J8410 Pulmonary fibrosis, unspecified: Secondary | ICD-10-CM | POA: Diagnosis not present

## 2021-07-21 DIAGNOSIS — Z9981 Dependence on supplemental oxygen: Secondary | ICD-10-CM | POA: Diagnosis not present

## 2021-07-21 DIAGNOSIS — C3432 Malignant neoplasm of lower lobe, left bronchus or lung: Secondary | ICD-10-CM | POA: Diagnosis not present

## 2021-07-21 DIAGNOSIS — C7971 Secondary malignant neoplasm of right adrenal gland: Secondary | ICD-10-CM | POA: Diagnosis not present

## 2021-07-21 DIAGNOSIS — C787 Secondary malignant neoplasm of liver and intrahepatic bile duct: Secondary | ICD-10-CM | POA: Diagnosis not present

## 2021-07-21 DIAGNOSIS — C7972 Secondary malignant neoplasm of left adrenal gland: Secondary | ICD-10-CM | POA: Diagnosis not present

## 2021-07-23 ENCOUNTER — Other Ambulatory Visit: Payer: Medicare Other

## 2021-07-23 ENCOUNTER — Ambulatory Visit: Payer: Medicare Other | Admitting: Oncology

## 2021-07-23 DIAGNOSIS — C7971 Secondary malignant neoplasm of right adrenal gland: Secondary | ICD-10-CM | POA: Diagnosis not present

## 2021-07-23 DIAGNOSIS — Z9981 Dependence on supplemental oxygen: Secondary | ICD-10-CM | POA: Diagnosis not present

## 2021-07-23 DIAGNOSIS — J8410 Pulmonary fibrosis, unspecified: Secondary | ICD-10-CM | POA: Diagnosis not present

## 2021-07-23 DIAGNOSIS — C3432 Malignant neoplasm of lower lobe, left bronchus or lung: Secondary | ICD-10-CM | POA: Diagnosis not present

## 2021-07-23 DIAGNOSIS — C7972 Secondary malignant neoplasm of left adrenal gland: Secondary | ICD-10-CM | POA: Diagnosis not present

## 2021-07-23 DIAGNOSIS — C787 Secondary malignant neoplasm of liver and intrahepatic bile duct: Secondary | ICD-10-CM | POA: Diagnosis not present

## 2021-07-24 ENCOUNTER — Other Ambulatory Visit: Payer: Self-pay | Admitting: Hematology and Oncology

## 2021-07-24 ENCOUNTER — Telehealth: Payer: Self-pay

## 2021-07-24 DIAGNOSIS — E785 Hyperlipidemia, unspecified: Secondary | ICD-10-CM | POA: Diagnosis not present

## 2021-07-24 DIAGNOSIS — S2231XD Fracture of one rib, right side, subsequent encounter for fracture with routine healing: Secondary | ICD-10-CM | POA: Diagnosis not present

## 2021-07-24 DIAGNOSIS — J84112 Idiopathic pulmonary fibrosis: Secondary | ICD-10-CM | POA: Diagnosis not present

## 2021-07-24 DIAGNOSIS — Z7951 Long term (current) use of inhaled steroids: Secondary | ICD-10-CM | POA: Diagnosis not present

## 2021-07-24 DIAGNOSIS — C7972 Secondary malignant neoplasm of left adrenal gland: Secondary | ICD-10-CM | POA: Diagnosis not present

## 2021-07-24 DIAGNOSIS — S22069D Unspecified fracture of T7-T8 vertebra, subsequent encounter for fracture with routine healing: Secondary | ICD-10-CM | POA: Diagnosis not present

## 2021-07-24 DIAGNOSIS — C787 Secondary malignant neoplasm of liver and intrahepatic bile duct: Secondary | ICD-10-CM | POA: Diagnosis not present

## 2021-07-24 DIAGNOSIS — Z9981 Dependence on supplemental oxygen: Secondary | ICD-10-CM | POA: Diagnosis not present

## 2021-07-24 DIAGNOSIS — C3432 Malignant neoplasm of lower lobe, left bronchus or lung: Secondary | ICD-10-CM | POA: Diagnosis not present

## 2021-07-24 DIAGNOSIS — C7970 Secondary malignant neoplasm of unspecified adrenal gland: Secondary | ICD-10-CM | POA: Diagnosis not present

## 2021-07-24 DIAGNOSIS — J8410 Pulmonary fibrosis, unspecified: Secondary | ICD-10-CM | POA: Diagnosis not present

## 2021-07-24 DIAGNOSIS — I2510 Atherosclerotic heart disease of native coronary artery without angina pectoris: Secondary | ICD-10-CM | POA: Diagnosis not present

## 2021-07-24 DIAGNOSIS — C7971 Secondary malignant neoplasm of right adrenal gland: Secondary | ICD-10-CM | POA: Diagnosis not present

## 2021-07-24 DIAGNOSIS — K219 Gastro-esophageal reflux disease without esophagitis: Secondary | ICD-10-CM | POA: Diagnosis not present

## 2021-07-24 DIAGNOSIS — J9610 Chronic respiratory failure, unspecified whether with hypoxia or hypercapnia: Secondary | ICD-10-CM | POA: Diagnosis not present

## 2021-07-24 MED ORDER — OXYCODONE HCL 5 MG PO TABS: 5.0000 mg | ORAL_TABLET | ORAL | 0 refills | Status: AC | PRN

## 2021-07-24 MED ORDER — ONDANSETRON 4 MG PO TBDP: 4.0000 mg | ORAL_TABLET | ORAL | 3 refills | Status: AC | PRN

## 2021-07-24 NOTE — Telephone Encounter (Signed)
Almira Bar, NP sent in prescriptions to [facility].

## 2021-07-25 DIAGNOSIS — C7971 Secondary malignant neoplasm of right adrenal gland: Secondary | ICD-10-CM | POA: Diagnosis not present

## 2021-07-25 DIAGNOSIS — C3432 Malignant neoplasm of lower lobe, left bronchus or lung: Secondary | ICD-10-CM | POA: Diagnosis not present

## 2021-07-25 DIAGNOSIS — J8410 Pulmonary fibrosis, unspecified: Secondary | ICD-10-CM | POA: Diagnosis not present

## 2021-07-25 DIAGNOSIS — C7972 Secondary malignant neoplasm of left adrenal gland: Secondary | ICD-10-CM | POA: Diagnosis not present

## 2021-07-25 DIAGNOSIS — C787 Secondary malignant neoplasm of liver and intrahepatic bile duct: Secondary | ICD-10-CM | POA: Diagnosis not present

## 2021-07-25 DIAGNOSIS — Z9981 Dependence on supplemental oxygen: Secondary | ICD-10-CM | POA: Diagnosis not present

## 2021-07-26 DIAGNOSIS — C787 Secondary malignant neoplasm of liver and intrahepatic bile duct: Secondary | ICD-10-CM | POA: Diagnosis not present

## 2021-07-26 DIAGNOSIS — Z9981 Dependence on supplemental oxygen: Secondary | ICD-10-CM | POA: Diagnosis not present

## 2021-07-26 DIAGNOSIS — J8410 Pulmonary fibrosis, unspecified: Secondary | ICD-10-CM | POA: Diagnosis not present

## 2021-07-26 DIAGNOSIS — C3432 Malignant neoplasm of lower lobe, left bronchus or lung: Secondary | ICD-10-CM | POA: Diagnosis not present

## 2021-07-26 DIAGNOSIS — C7972 Secondary malignant neoplasm of left adrenal gland: Secondary | ICD-10-CM | POA: Diagnosis not present

## 2021-07-26 DIAGNOSIS — C7971 Secondary malignant neoplasm of right adrenal gland: Secondary | ICD-10-CM | POA: Diagnosis not present

## 2021-07-27 DIAGNOSIS — C7971 Secondary malignant neoplasm of right adrenal gland: Secondary | ICD-10-CM | POA: Diagnosis not present

## 2021-07-27 DIAGNOSIS — C3432 Malignant neoplasm of lower lobe, left bronchus or lung: Secondary | ICD-10-CM | POA: Diagnosis not present

## 2021-07-27 DIAGNOSIS — C787 Secondary malignant neoplasm of liver and intrahepatic bile duct: Secondary | ICD-10-CM | POA: Diagnosis not present

## 2021-07-27 DIAGNOSIS — J8410 Pulmonary fibrosis, unspecified: Secondary | ICD-10-CM | POA: Diagnosis not present

## 2021-07-27 DIAGNOSIS — Z9981 Dependence on supplemental oxygen: Secondary | ICD-10-CM | POA: Diagnosis not present

## 2021-07-27 DIAGNOSIS — C7972 Secondary malignant neoplasm of left adrenal gland: Secondary | ICD-10-CM | POA: Diagnosis not present

## 2021-07-28 DIAGNOSIS — C7972 Secondary malignant neoplasm of left adrenal gland: Secondary | ICD-10-CM | POA: Diagnosis not present

## 2021-07-28 DIAGNOSIS — C3432 Malignant neoplasm of lower lobe, left bronchus or lung: Secondary | ICD-10-CM | POA: Diagnosis not present

## 2021-07-28 DIAGNOSIS — C7971 Secondary malignant neoplasm of right adrenal gland: Secondary | ICD-10-CM | POA: Diagnosis not present

## 2021-07-28 DIAGNOSIS — Z9981 Dependence on supplemental oxygen: Secondary | ICD-10-CM | POA: Diagnosis not present

## 2021-07-28 DIAGNOSIS — C787 Secondary malignant neoplasm of liver and intrahepatic bile duct: Secondary | ICD-10-CM | POA: Diagnosis not present

## 2021-07-28 DIAGNOSIS — J8410 Pulmonary fibrosis, unspecified: Secondary | ICD-10-CM | POA: Diagnosis not present

## 2021-07-29 DIAGNOSIS — Z9981 Dependence on supplemental oxygen: Secondary | ICD-10-CM | POA: Diagnosis not present

## 2021-07-29 DIAGNOSIS — C7971 Secondary malignant neoplasm of right adrenal gland: Secondary | ICD-10-CM | POA: Diagnosis not present

## 2021-07-29 DIAGNOSIS — C7972 Secondary malignant neoplasm of left adrenal gland: Secondary | ICD-10-CM | POA: Diagnosis not present

## 2021-07-29 DIAGNOSIS — C3432 Malignant neoplasm of lower lobe, left bronchus or lung: Secondary | ICD-10-CM | POA: Diagnosis not present

## 2021-07-29 DIAGNOSIS — J8410 Pulmonary fibrosis, unspecified: Secondary | ICD-10-CM | POA: Diagnosis not present

## 2021-07-29 DIAGNOSIS — C787 Secondary malignant neoplasm of liver and intrahepatic bile duct: Secondary | ICD-10-CM | POA: Diagnosis not present

## 2021-07-30 DIAGNOSIS — J8410 Pulmonary fibrosis, unspecified: Secondary | ICD-10-CM | POA: Diagnosis not present

## 2021-07-30 DIAGNOSIS — Z9981 Dependence on supplemental oxygen: Secondary | ICD-10-CM | POA: Diagnosis not present

## 2021-07-30 DIAGNOSIS — C787 Secondary malignant neoplasm of liver and intrahepatic bile duct: Secondary | ICD-10-CM | POA: Diagnosis not present

## 2021-07-30 DIAGNOSIS — C3432 Malignant neoplasm of lower lobe, left bronchus or lung: Secondary | ICD-10-CM | POA: Diagnosis not present

## 2021-07-30 DIAGNOSIS — C7972 Secondary malignant neoplasm of left adrenal gland: Secondary | ICD-10-CM | POA: Diagnosis not present

## 2021-07-30 DIAGNOSIS — C7971 Secondary malignant neoplasm of right adrenal gland: Secondary | ICD-10-CM | POA: Diagnosis not present

## 2021-08-01 DIAGNOSIS — C787 Secondary malignant neoplasm of liver and intrahepatic bile duct: Secondary | ICD-10-CM | POA: Diagnosis not present

## 2021-08-01 DIAGNOSIS — C3432 Malignant neoplasm of lower lobe, left bronchus or lung: Secondary | ICD-10-CM | POA: Diagnosis not present

## 2021-08-01 DIAGNOSIS — C7972 Secondary malignant neoplasm of left adrenal gland: Secondary | ICD-10-CM | POA: Diagnosis not present

## 2021-08-01 DIAGNOSIS — C7971 Secondary malignant neoplasm of right adrenal gland: Secondary | ICD-10-CM | POA: Diagnosis not present

## 2021-08-01 DIAGNOSIS — J8410 Pulmonary fibrosis, unspecified: Secondary | ICD-10-CM | POA: Diagnosis not present

## 2021-08-01 DIAGNOSIS — Z9981 Dependence on supplemental oxygen: Secondary | ICD-10-CM | POA: Diagnosis not present

## 2021-08-04 ENCOUNTER — Other Ambulatory Visit: Payer: Self-pay

## 2021-08-04 DIAGNOSIS — Z515 Encounter for palliative care: Secondary | ICD-10-CM

## 2021-08-04 DIAGNOSIS — J8410 Pulmonary fibrosis, unspecified: Secondary | ICD-10-CM | POA: Diagnosis not present

## 2021-08-04 DIAGNOSIS — Z9981 Dependence on supplemental oxygen: Secondary | ICD-10-CM | POA: Diagnosis not present

## 2021-08-04 DIAGNOSIS — C3432 Malignant neoplasm of lower lobe, left bronchus or lung: Secondary | ICD-10-CM | POA: Diagnosis not present

## 2021-08-04 DIAGNOSIS — C7972 Secondary malignant neoplasm of left adrenal gland: Secondary | ICD-10-CM | POA: Diagnosis not present

## 2021-08-04 DIAGNOSIS — R443 Hallucinations, unspecified: Secondary | ICD-10-CM

## 2021-08-04 DIAGNOSIS — R451 Restlessness and agitation: Secondary | ICD-10-CM

## 2021-08-04 DIAGNOSIS — F5109 Other insomnia not due to a substance or known physiological condition: Secondary | ICD-10-CM

## 2021-08-04 DIAGNOSIS — K5900 Constipation, unspecified: Secondary | ICD-10-CM

## 2021-08-04 DIAGNOSIS — C787 Secondary malignant neoplasm of liver and intrahepatic bile duct: Secondary | ICD-10-CM | POA: Diagnosis not present

## 2021-08-04 DIAGNOSIS — C7971 Secondary malignant neoplasm of right adrenal gland: Secondary | ICD-10-CM | POA: Diagnosis not present

## 2021-08-04 DIAGNOSIS — R059 Cough, unspecified: Secondary | ICD-10-CM

## 2021-08-04 MED ORDER — SENNOSIDES-DOCUSATE SODIUM 8.6-50 MG PO TABS: 2.0000 | ORAL_TABLET | Freq: Two times a day (BID) | ORAL | 1 refills | Status: AC

## 2021-08-04 MED ORDER — HALOPERIDOL 1 MG PO TABS
ORAL_TABLET | ORAL | 1 refills | Status: AC
Start: 2021-08-04 — End: ?

## 2021-08-04 MED ORDER — BENZONATATE 100 MG PO CAPS
100.0000 mg | ORAL_CAPSULE | Freq: Three times a day (TID) | ORAL | 1 refills | Status: AC | PRN
Start: 2021-08-04 — End: ?

## 2021-08-04 MED ORDER — ZOLPIDEM TARTRATE 10 MG PO TABS: 10.0000 mg | ORAL_TABLET | Freq: Every evening | ORAL | 0 refills | Status: AC | PRN

## 2021-08-05 DIAGNOSIS — C7971 Secondary malignant neoplasm of right adrenal gland: Secondary | ICD-10-CM | POA: Diagnosis not present

## 2021-08-05 DIAGNOSIS — Z9981 Dependence on supplemental oxygen: Secondary | ICD-10-CM | POA: Diagnosis not present

## 2021-08-05 DIAGNOSIS — C3432 Malignant neoplasm of lower lobe, left bronchus or lung: Secondary | ICD-10-CM | POA: Diagnosis not present

## 2021-08-05 DIAGNOSIS — C7972 Secondary malignant neoplasm of left adrenal gland: Secondary | ICD-10-CM | POA: Diagnosis not present

## 2021-08-05 DIAGNOSIS — C787 Secondary malignant neoplasm of liver and intrahepatic bile duct: Secondary | ICD-10-CM | POA: Diagnosis not present

## 2021-08-05 DIAGNOSIS — J8410 Pulmonary fibrosis, unspecified: Secondary | ICD-10-CM | POA: Diagnosis not present

## 2021-08-17 DEATH — deceased
